# Patient Record
Sex: Female | Born: 1975 | Hispanic: No | Marital: Married | Smoking: Never smoker
Health system: Western US, Community
[De-identification: ages and names within clinical notes are randomized; demographics above are authoritative.]

## PROBLEM LIST (undated history)

## (undated) ENCOUNTER — Inpatient Hospital Stay (HOSPITAL_COMMUNITY): Payer: Self-pay

## (undated) DIAGNOSIS — E559 Vitamin D deficiency, unspecified: Secondary | ICD-10-CM

## (undated) DIAGNOSIS — U071 COVID-19: Secondary | ICD-10-CM

## (undated) DIAGNOSIS — IMO0001 Reserved for inherently not codable concepts without codable children: Secondary | ICD-10-CM

## (undated) DIAGNOSIS — E538 Deficiency of other specified B group vitamins: Secondary | ICD-10-CM

## (undated) DIAGNOSIS — G43909 Migraine, unspecified, not intractable, without status migrainosus: Secondary | ICD-10-CM

## (undated) DIAGNOSIS — K56609 Unspecified intestinal obstruction, unspecified as to partial versus complete obstruction: Secondary | ICD-10-CM

## (undated) DIAGNOSIS — K219 Gastro-esophageal reflux disease without esophagitis: Secondary | ICD-10-CM

## (undated) DIAGNOSIS — Z9049 Acquired absence of other specified parts of digestive tract: Secondary | ICD-10-CM

## (undated) DIAGNOSIS — D649 Anemia, unspecified: Secondary | ICD-10-CM

## (undated) DIAGNOSIS — E041 Nontoxic single thyroid nodule: Secondary | ICD-10-CM

## (undated) DIAGNOSIS — E282 Polycystic ovarian syndrome: Secondary | ICD-10-CM

## (undated) DIAGNOSIS — J302 Other seasonal allergic rhinitis: Secondary | ICD-10-CM

## (undated) DIAGNOSIS — C4490 Unspecified malignant neoplasm of skin, unspecified: Secondary | ICD-10-CM

## (undated) DIAGNOSIS — R0600 Dyspnea, unspecified: Secondary | ICD-10-CM

## (undated) DIAGNOSIS — E612 Magnesium deficiency: Secondary | ICD-10-CM

## (undated) DIAGNOSIS — Z9884 Bariatric surgery status: Secondary | ICD-10-CM

## (undated) DIAGNOSIS — E039 Hypothyroidism, unspecified: Secondary | ICD-10-CM

## (undated) HISTORY — PX: FRACTURE SURGERY: SHX138

## (undated) HISTORY — PX: TONSILLECTOMY: SUR1361

## (undated) HISTORY — PX: OTHER SURGICAL HISTORY: SHX169

## (undated) HISTORY — PX: SKIN CANCER EXCISION: SHX779

## (undated) HISTORY — DX: Migraine, unspecified, not intractable, without status migrainosus: G43.909

## (undated) HISTORY — PX: GASTRIC BYPASS: SHX52

## (undated) HISTORY — PX: COLON SURGERY: SHX602

## (undated) HISTORY — DX: Dyspnea, unspecified: R06.00

## (undated) HISTORY — PX: CHOLECYSTECTOMY: SHX55

## (undated) LAB — HM PAP SMEAR

## (undated) MED FILL — DEXTROAMPHETAMINE-AMPHETAMINE ER 15 MG 24HR CAPSULE,EXTEND RELEASE: 15 mg | ORAL | Qty: 28 | Fill #1

## (undated) MED FILL — ATENOLOL 25 MG TABLET: 25 mg | ORAL | 60 days supply | Qty: 30 | Fill #3

## (undated) MED FILL — LIOTHYRONINE 5 MCG TABLET: 5 ug | ORAL | 30 days supply | Qty: 30 | Fill #3

## (undated) MED FILL — ACETYLCYSTEINE 600 MG CAPSULE: 600 600 mg | ORAL | 90 days supply | Qty: 180 | Fill #2 | Status: CN

## (undated) MED FILL — LEVOTHYROXINE 150 MCG TABLET: 150 ug | ORAL | 90 days supply | Qty: 90 | Fill #2

---

## 2007-03-03 LAB — CBC WITH AUTO DIFFERENTIAL
Basophils %: 1 % (ref 0–2)
Basophils, Absolute: 0.1 10*3/uL (ref 0–0.2)
Eosinophils %: 4 % (ref 0–7)
Eosinophils, Absolute: 0.3 10*3/uL (ref 0–0.7)
HCT: 36.9 % — ABNORMAL LOW (ref 37.0–48.0)
Hgb: 12.7 gm/dL (ref 12.0–16.0)
Lymphocytes %: 20 % — ABNORMAL LOW (ref 25–45)
Lymphocytes, Absolute: 1.6 10*3/uL (ref 1.1–4.3)
MCH: 29.8 pg (ref 27–34)
MCHC: 34.4 gm/dL (ref 32–36)
MCV: 86.5 fL (ref 81–99)
MPV: 8.9 fL (ref 7.4–10.4)
Monocytes %: 7 % (ref 0–12)
Monocytes, Absolute: 0.6 10*3/uL (ref 0–1.2)
Neutrophils, Absolute: 5.2 10*3/uL (ref 1.6–7.3)
Platelet Count: 301 10*3/uL (ref 150–400)
RBC: 4.27 10*6/uL (ref 4.20–5.40)
RDW: 11.9 % (ref 11.5–14.5)
Segs (Neutrophils)%: 68 % (ref 35–70)
WBC: 7.8 10*3/uL (ref 4.8–10.8)

## 2007-03-03 LAB — URINALYSIS, ROUTINE
Bilirubin, urine: NEGATIVE
Blood, urine: NEGATIVE
Glucose, UA: NEGATIVE mg/dl
Ketones, urine: NEGATIVE
Leukocyte Esterase, urine: NEGATIVE
Nitrite, urine: POSITIVE — AB
Protein, urine: NEGATIVE
Specific Gravity, urine: 1.025 (ref 1.002–1.035)
Urobilinogen, urine: NEGATIVE EU/dL
pH, UA: 6 (ref 5.0–9.0)

## 2007-03-03 LAB — UA, MICROSCOPIC ONLY
RBC, UA: NONE SEEN /HPF
Squamous Epithelial, UA: 2 /HPF (ref 0–5)
WBC, UA: 0 /HPF

## 2007-03-03 LAB — BASIC METABOLIC PANEL
Anion Gap: 5 mmol/L (ref 3–11)
BUN: 16 mg/dL (ref 8–21)
CO2 - Carbon Dioxide: 26 mmol/L (ref 22.0–31.0)
Calcium: 8.6 mg/dL (ref 8.5–10.5)
Chloride: 107 mmol/L (ref 98–111)
Creatinine, Serum: 0.8 mg/dL (ref 0.6–1.3)
GFR Estimate: >60 mL/min/{1.73_m2} (ref >60)
Glucose: 98 mg/dL (ref 80–99)
Potassium: 3.5 mmol/L (ref 3.5–5.1)
Sodium: 138 mmol/L (ref 133–147)

## 2007-03-03 LAB — URINE CULTURE IF INDICATED ORD W/URINALYSIS

## 2007-03-11 LAB — URINALYSIS, ROUTINE
Bilirubin, urine: NEGATIVE
Blood, urine: NEGATIVE
Glucose, UA: NEGATIVE mg/dl
Ketones, urine: NEGATIVE
Leukocyte Esterase, urine: NEGATIVE
Nitrite, urine: NEGATIVE
Protein, urine: NEGATIVE
Specific Gravity, urine: 1.01 (ref 1.002–1.035)
Urobilinogen, urine: NEGATIVE EU/dL
pH, UA: 6 (ref 5.0–9.0)

## 2007-03-11 LAB — URINE CULTURE: Culture Result: >100000

## 2007-07-11 LAB — WOUND CULTURE, SUPERFICIAL

## 2007-08-29 LAB — COMPREHENSIVE METABOLIC PANEL
ALT - Alanine Amino transferase: 12 IU/L (ref 5–40)
AST - Aspartate Aminotransferase: 15 IU/L (ref 10–42)
Albumin/Globulin Ratio: 1.1 (ref >0.9)
Albumin: 3.7 gm/dL (ref 3.5–5.0)
Alkaline Phosphatase: 57 IU/L (ref 37–107)
Anion Gap: 5 mmol/L (ref 3–11)
BUN: 12 mg/dL (ref 8–21)
Bilirubin, Total: 0.6 mg/dL (ref 0.2–1.2)
CO2 - Carbon Dioxide: 26 mmol/L (ref 22.0–31.0)
Calcium: 9 mg/dL (ref 8.5–10.5)
Chloride: 109 mmol/L (ref 98–111)
Creatinine, Serum: 0.8 mg/dL (ref 0.6–1.3)
GFR Estimate: >60 mL/min/{1.73_m2} (ref >60)
Globulin: 3.3 gm/dL (ref 2.2–3.7)
Glucose: 107 mg/dL — ABNORMAL HIGH (ref 80–99)
Potassium: 3.9 mmol/L (ref 3.5–5.1)
Protein, Total: 7 gm/dL (ref 6.1–7.9)
Sodium: 140 mmol/L (ref 133–147)

## 2007-08-29 LAB — CBC WITH AUTO DIFFERENTIAL
Basophils %: 1 % (ref 0–2)
Basophils, Absolute: 0 10*3/uL (ref 0–0.2)
Eosinophils %: 2 % (ref 0–7)
Eosinophils, Absolute: 0.2 10*3/uL (ref 0–0.7)
HCT: 38.2 % (ref 37.0–48.0)
Hgb: 13 gm/dL (ref 12.0–16.0)
Lymphocytes %: 16 % — ABNORMAL LOW (ref 25–45)
Lymphocytes, Absolute: 1.3 10*3/uL (ref 1.1–4.3)
MCH: 29.5 pg (ref 27–34)
MCHC: 34 gm/dL (ref 32–36)
MCV: 86.6 fL (ref 81–99)
MPV: 9.3 fL (ref 7.4–10.4)
Monocytes %: 5 % (ref 0–12)
Monocytes, Absolute: 0.4 10*3/uL (ref 0–1.2)
Neutrophils, Absolute: 6.3 10*3/uL (ref 1.6–7.3)
Platelet Count: 292 10*3/uL (ref 150–400)
RBC: 4.41 10*6/uL (ref 4.20–5.40)
RDW: 14 % (ref 11.5–14.5)
Segs (Neutrophils)%: 76 % — ABNORMAL HIGH (ref 35–70)
WBC: 8.2 10*3/uL (ref 4.8–10.8)

## 2007-08-29 LAB — GLYCO-HEMOGLOBIN A1C: Hemoglobin A1C: 5.2 % (ref 4.3–6.1)

## 2007-08-29 LAB — T3, FREE: T3, Free: 2.5 pg/mL (ref 2.4–3.9)

## 2007-08-29 LAB — THYROGLOBULIN AND PEROXIDASE ABS
Thyroglobulin Antibody: <1.8 IU/mL (ref <4.0)
Thyroid Peroxidase Antibody: 13.8 IU/mL — ABNORMAL HIGH (ref <9.0)

## 2007-08-29 LAB — T4, FREE: T4, Free: 1.2 ng/dL (ref 0.6–1.2)

## 2007-08-29 LAB — TSH: TSH - Thyroid Stimulating Hormone: 1.83 microIU/mL (ref 0.40–5.80)

## 2008-01-28 LAB — CBC WITH AUTO DIFFERENTIAL
Basophils %: 1 % (ref 0–2)
Basophils, Absolute: 0.1 10*3/uL (ref 0–0.2)
Eosinophils %: 3 % (ref 0–7)
Eosinophils, Absolute: 0.3 10*3/uL (ref 0–0.7)
HCT: 37.3 % (ref 37.0–48.0)
Hgb: 12.9 gm/dL (ref 12.0–16.0)
Lymphocytes %: 25 % (ref 25–45)
Lymphocytes, Absolute: 2.1 10*3/uL (ref 1.1–4.3)
MCH: 28.8 pg (ref 27–34)
MCHC: 34.5 gm/dL (ref 32–36)
MCV: 83.5 fL (ref 81–99)
MPV: 9.4 fL (ref 7.4–10.4)
Monocytes %: 5 % (ref 0–12)
Monocytes, Absolute: 0.4 10*3/uL (ref 0–1.2)
Neutrophils, Absolute: 5.5 10*3/uL (ref 1.6–7.3)
Platelet Count: 314 10*3/uL (ref 150–400)
RBC: 4.46 10*6/uL (ref 4.20–5.40)
RDW: 12.4 % (ref 11.5–14.5)
Segs (Neutrophils)%: 66 % (ref 35–70)
WBC: 8.3 10*3/uL (ref 4.8–10.8)

## 2008-01-28 LAB — TSH: TSH - Thyroid Stimulating Hormone: 2.03 microIU/mL (ref 0.40–5.80)

## 2008-01-28 LAB — THYROGLOBULIN AND PEROXIDASE ABS
Thyroglobulin Antibody: <1.8 IU/mL (ref <4.0)
Thyroid Peroxidase Antibody: 12.2 IU/mL — ABNORMAL HIGH (ref <9.0)

## 2008-01-28 LAB — T3, FREE: T3, Free: 2.9 pg/mL (ref 2.4–3.9)

## 2008-01-28 LAB — ESTRADIOL: Estradiol: 54 pg/mL

## 2008-01-28 LAB — FOLLICLE STIMULATING HORMONE: FSH: 8.9 m[IU]/mL

## 2008-01-28 LAB — LUTEINIZING HORMONE: LH: 8.9 m[IU]/mL

## 2008-01-28 LAB — PROGESTERONE: Progesterone: 0.5 ng/mL (ref 0.1–20.0)

## 2008-01-28 LAB — T4, FREE: T4, Free: 1.3 ng/dL — ABNORMAL HIGH (ref 0.6–1.2)

## 2008-02-12 IMAGING — CR DG ABDOMEN 1V
1 series · 3 of 3 positions shown · non-contrast
Comparison: NONE

CLINICAL DATA: Abdominal pain for 3 days. 

ABDOMINAL KUB UPRIGHT IMAGE

[Series 1: view not recorded · 0.17mm/px · 3 of 3 slices shown]
[im 1/3]
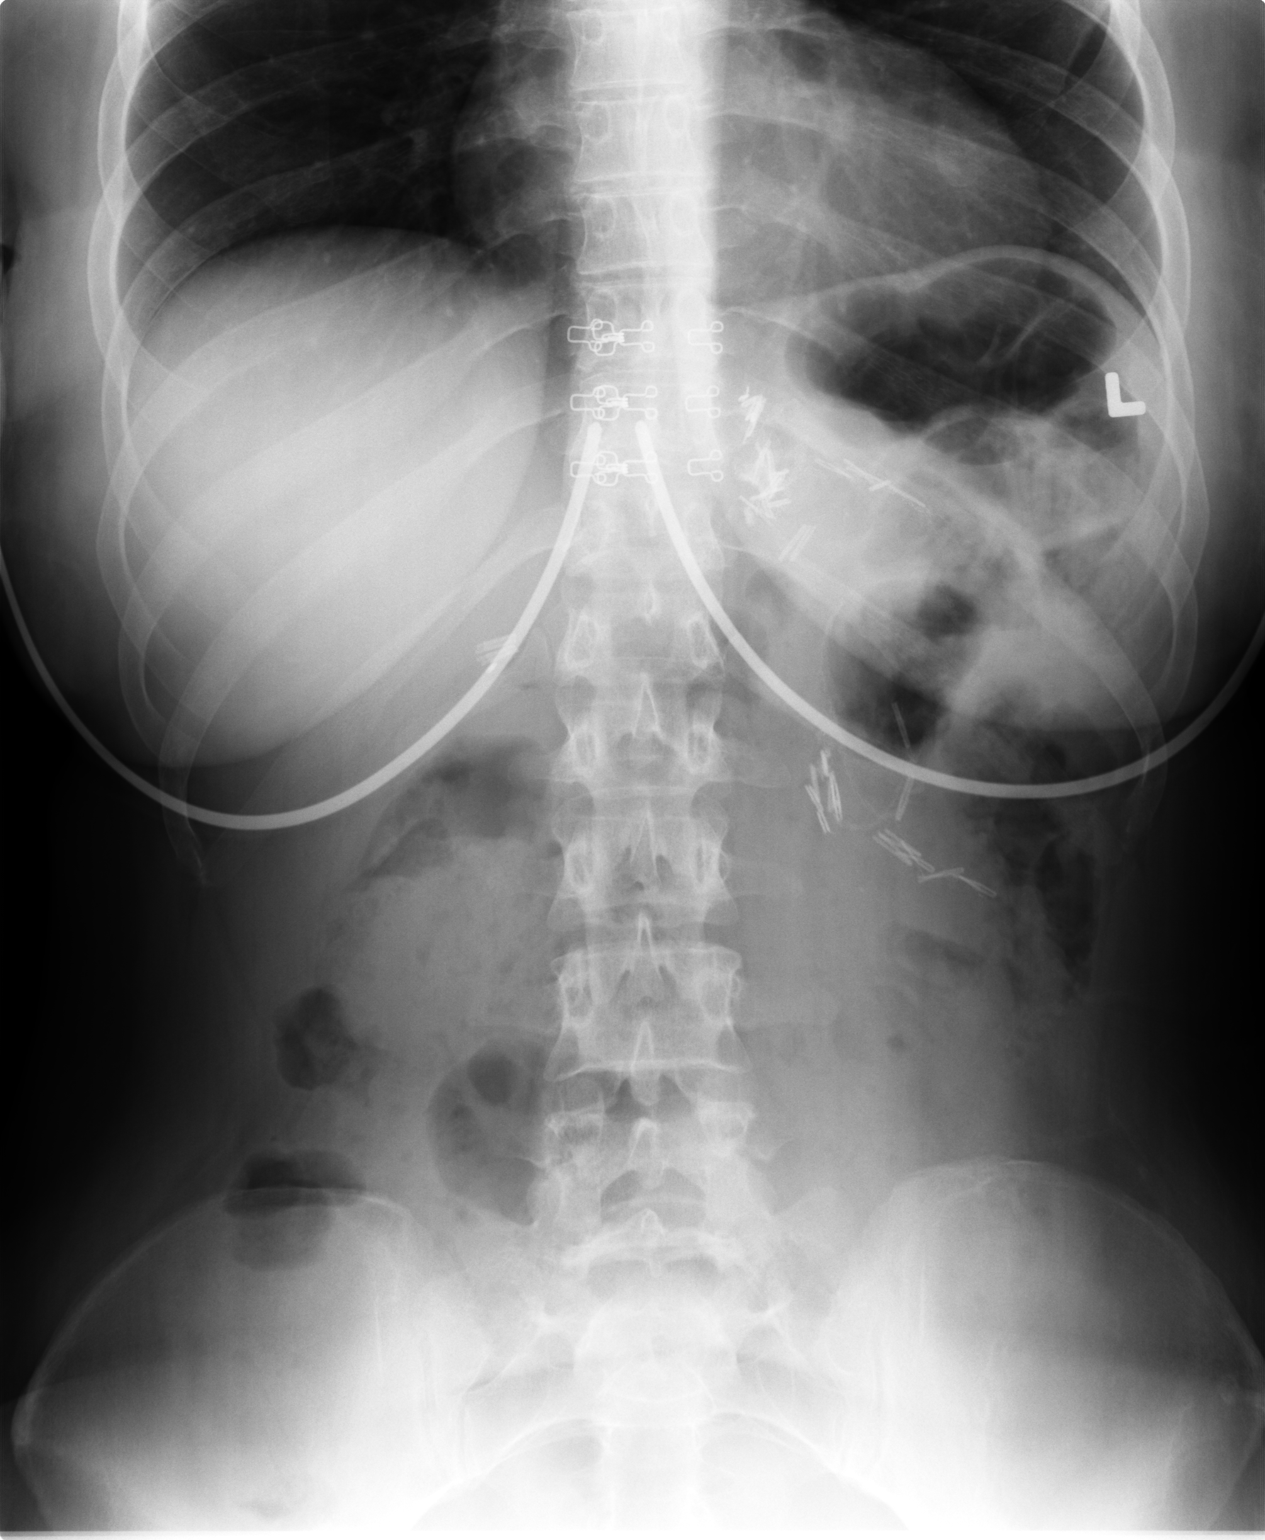
[im 2/3]
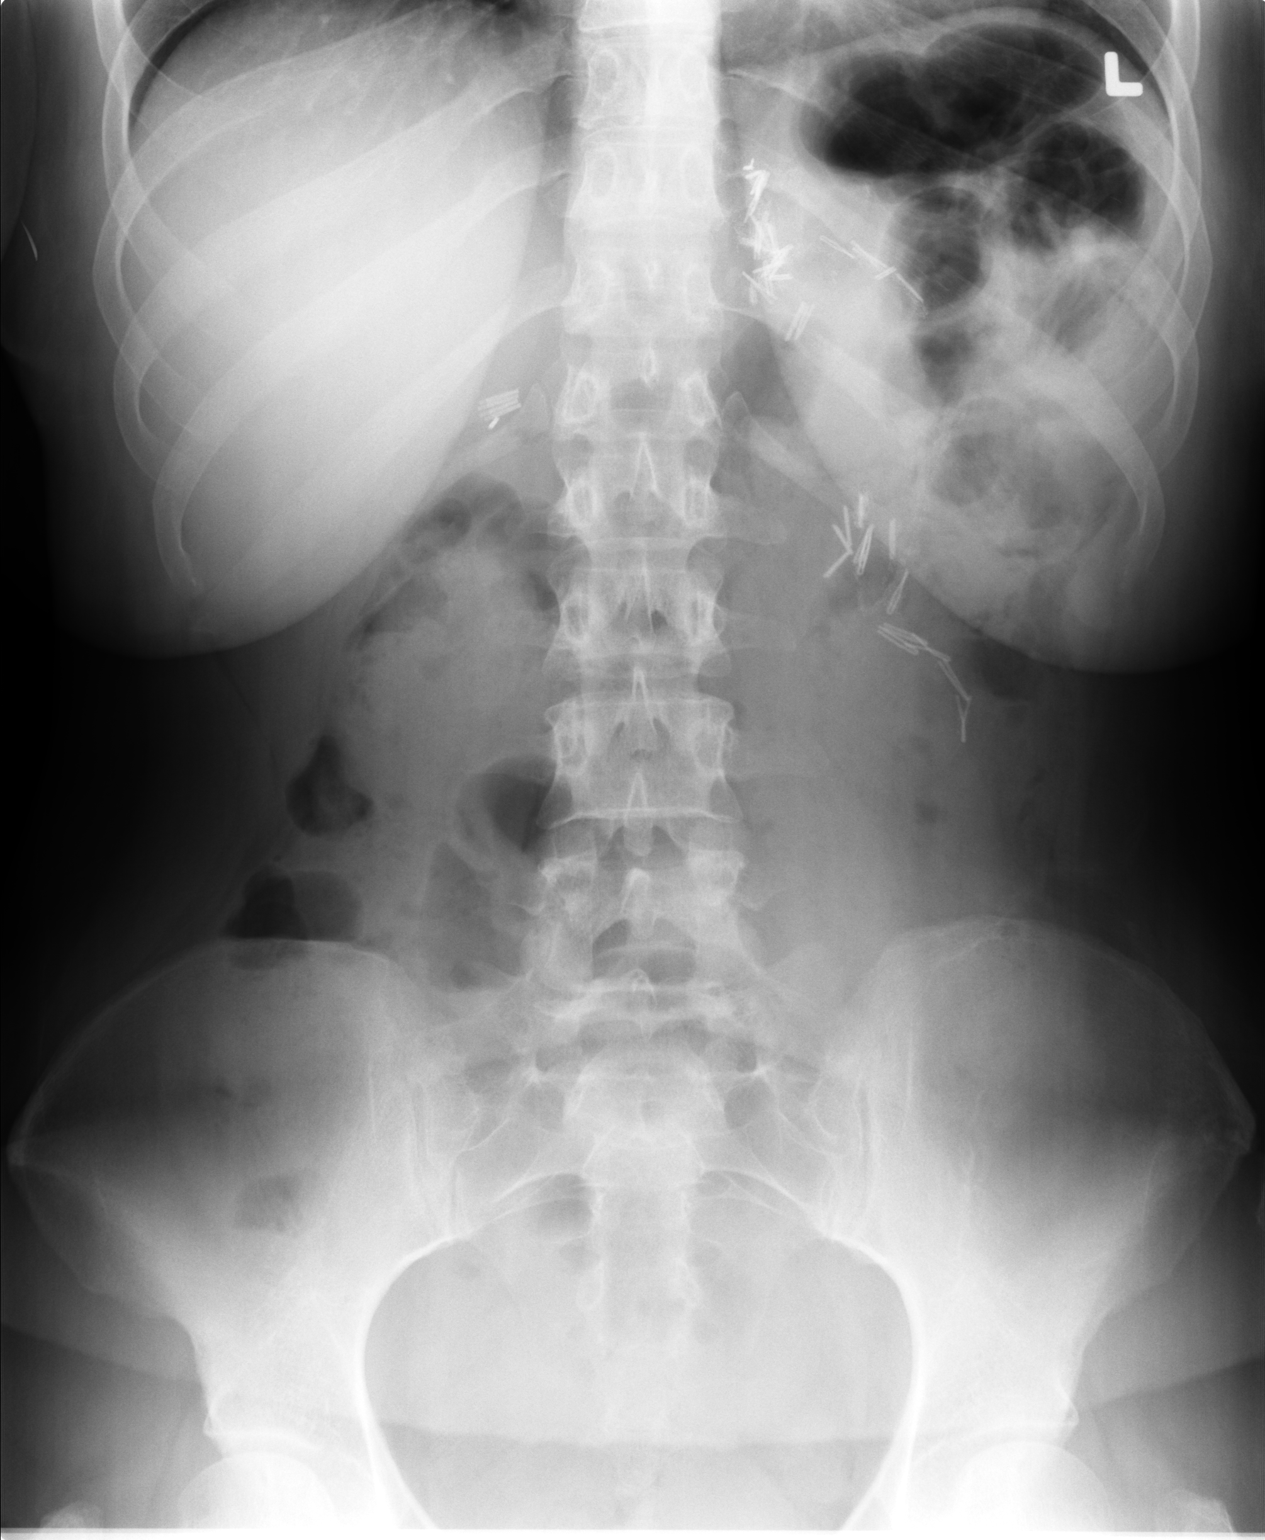
[im 3/3]
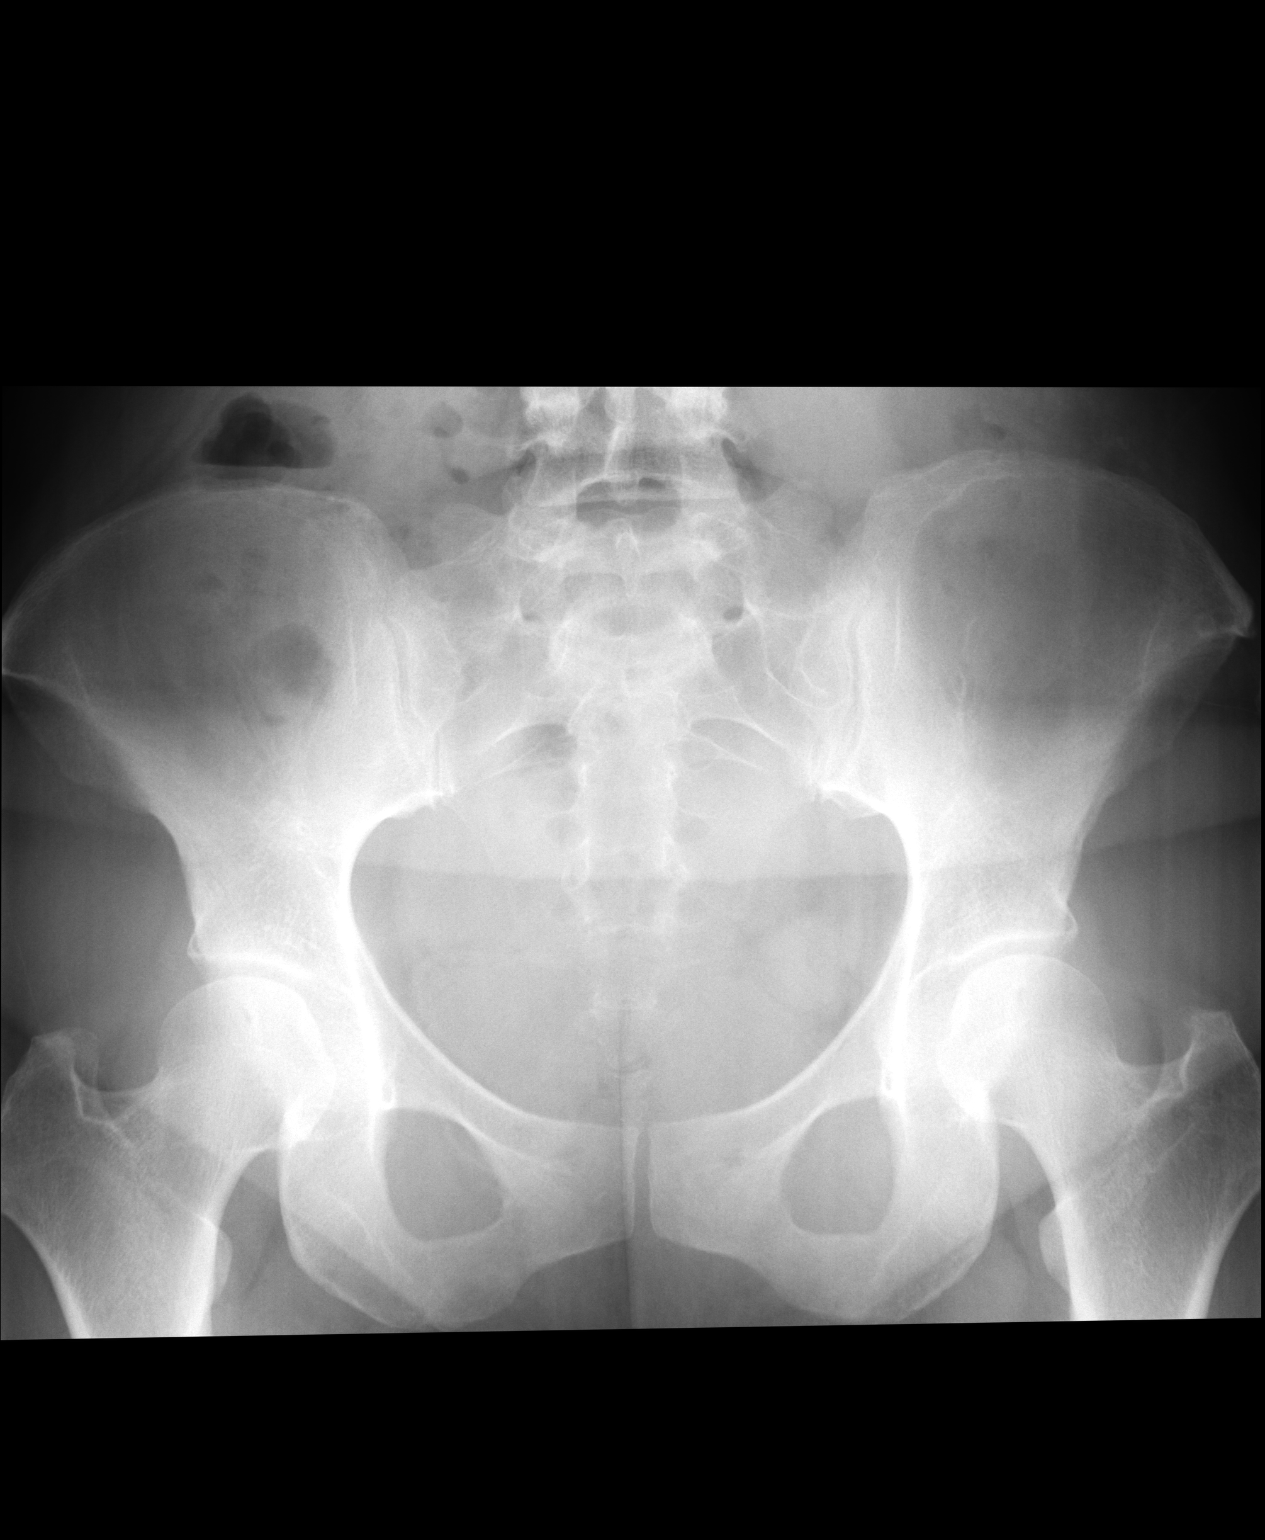

[3 of 3 positions shown; findings below may reference images not displayed]

FINDINGS: There are surgical clips seen in the left side of the 
abdomen. No evidence of free air or bowel dilatation. No abnormal 
air-fluid levels are seen. The visualized skeleton appears to be 
within normal limits. There is stool scattered throughout the 
colon.
IMPRESSION: Question constipation. No evidence of bowel 
obstruction, ileus, or perforation. Jaymi Su, M.D. 
Date: 06/23/2007 J[facility]  [REDACTED]

## 2008-07-25 LAB — ESTRADIOL: Estradiol: 40 pg/mL

## 2008-07-25 LAB — LUTEINIZING HORMONE: LH: 14.1 m[IU]/mL

## 2008-08-18 ENCOUNTER — Other Ambulatory Visit: Admission: RE | Admit: 2008-08-18 | Discharge: 2008-08-18 | Payer: Self-pay | Admitting: Family Medicine

## 2009-03-23 LAB — ORGANISM ID CULTURE

## 2009-03-23 LAB — TOTAL T3: T3, Total: 92 ng/dL (ref 80–190)

## 2009-10-03 LAB — HIV-1 AND HIV-2 ANTIBODIES: HIV-1/HIV-2 Ab: NEGATIVE

## 2009-10-03 LAB — TOTAL T3: T3, Total: 78 ng/dL — ABNORMAL LOW (ref 80–190)

## 2010-05-26 ENCOUNTER — Other Ambulatory Visit: Admission: RE | Admit: 2010-05-26 | Discharge: 2010-05-26 | Payer: Self-pay | Admitting: Family Medicine

## 2012-09-27 ENCOUNTER — Encounter: Payer: Self-pay | Admitting: Emergency Medicine

## 2012-09-27 ENCOUNTER — Emergency Department
Admission: EM | Admit: 2012-09-27 | Discharge: 2012-09-27 | Payer: No Typology Code available for payment source | Source: Home / Self Care

## 2012-09-27 NOTE — ED Notes (Signed)
Dr. Alvester Morin has seen patient and discussed need for evaluation in ER; patient and support person understand and will go there now.

## 2012-09-27 NOTE — ED Notes (Signed)
Patient reports conjunctivitis and periorbital edema x 24 hours; no known injury; has had "pink eye" in past, but nothing this severe.

## 2013-11-05 NOTE — L&D Delivery Note (Signed)
Delivery Note At 10:33 AM, on Dec. 29, 2015, a viable female, "Margaret Miller" was delivered via Vaginal, Spontaneous Delivery in the waterbirth tub (Presentation Occiput Anterior). V.Smith, CNM present for delivery as proctor.  Infant embraced by mother and brought to chest were provider gave tactile stimulation with dry towel.  Infant APGARs: 9, 9. Mother up and out of tub then cord clamped, cut, and blood collected. Placenta delivered spontaneously and noted to be intact with 3VC upon inspection. Vaginal inspection revealed partial 3rd degree laceration and a periclitoral laceration that extended into the labia. Dr. Octavio Manns called and performed repair.  Fundus firm, at the umbilicus, and bleeding small.  Mother hemodynamically stable and infant skin to skin prior to provider and MD exit.  Mother unsure of birth control method and opts to breastfeed. Family desires outpatient circumcision and infant weight at one hour of life: 7lbs 7oz  Anesthesia: Local  Episiotomy: None Lacerations: 3rd degree, Periclitoral with extension into the labia Suture Repair: 3.0 vicryl Est. Blood Loss (mL):  500  Mom to postpartum.  Baby to Couplet care / Skin to Skin.  Margaret Miller, [name] MSN, CNM 11/02/2014, 11:22 AM

## 2014-01-26 DIAGNOSIS — E039 Hypothyroidism, unspecified: Secondary | ICD-10-CM | POA: Diagnosis present

## 2014-02-09 DIAGNOSIS — E7211 Homocystinuria: Secondary | ICD-10-CM | POA: Insufficient documentation

## 2014-02-09 DIAGNOSIS — E7212 Methylenetetrahydrofolate reductase deficiency: Secondary | ICD-10-CM

## 2014-03-23 LAB — OB RESULTS CONSOLE RPR: RPR: NONREACTIVE

## 2014-03-23 LAB — OB RESULTS CONSOLE GC/CHLAMYDIA
Chlamydia: NEGATIVE
GC PROBE AMP, GENITAL: NEGATIVE

## 2014-03-23 LAB — OB RESULTS CONSOLE HEPATITIS B SURFACE ANTIGEN: HEP B S AG: NEGATIVE

## 2014-03-23 LAB — OB RESULTS CONSOLE RUBELLA ANTIBODY, IGM: RUBELLA: IMMUNE

## 2014-03-23 LAB — OB RESULTS CONSOLE ABO/RH: RH TYPE: POSITIVE

## 2014-03-23 LAB — OB RESULTS CONSOLE HIV ANTIBODY (ROUTINE TESTING): HIV: NONREACTIVE

## 2014-03-23 LAB — OB RESULTS CONSOLE ANTIBODY SCREEN: Antibody Screen: NEGATIVE

## 2014-09-07 ENCOUNTER — Ambulatory Visit: Admit: 2014-09-07 | Discharge: 2014-09-07 | Payer: PRIVATE HEALTH INSURANCE | Attending: MD | Primary: Family

## 2014-09-07 ENCOUNTER — Ambulatory Visit: Admit: 2014-09-07 | Discharge: 2014-09-07 | Payer: PRIVATE HEALTH INSURANCE | Primary: Family

## 2014-09-07 DIAGNOSIS — E063 Autoimmune thyroiditis: Secondary | ICD-10-CM

## 2014-09-07 LAB — COMPREHENSIVE METABOLIC PANEL
ALT - Alanine Amino transferase: 19 IU/L (ref 7–52)
AST - Aspartate Aminotransferase: 14 IU/L (ref 8–39)
Albumin/Globulin Ratio: 1.2 (ref >0.9)
Albumin: 4.1 gm/dL (ref 3.5–5.0)
Alkaline Phosphatase: 71 IU/L (ref 34–104)
Anion Gap: 7.1 mmol/L (ref 3–11)
BUN: 11 mg/dL (ref 6.0–23.0)
Bilirubin, Total: 0.5 mg/dL (ref 0.3–1.2)
CO2 - Carbon Dioxide: 23.9 mmol/L (ref 21.0–31.0)
Calcium: 9.1 mg/dL (ref 8.6–10.0)
Chloride: 105 mmol/L (ref 98–111)
Creatinine, Serum: 0.74 mg/dL (ref 0.55–1.10)
GFR Estimate: >60 mL/min/{1.73_m2} (ref >60)
Globulin: 3.5 gm/dL (ref 2.2–3.7)
Glucose: 86 mg/dL (ref 80–99)
Potassium: 4 mmol/L (ref 3.5–5.1)
Protein, Total: 7.6 gm/dL (ref 6.0–8.0)
Sodium: 136 mmol/L (ref 135–143)

## 2014-09-07 LAB — CBC WITH AUTO DIFFERENTIAL
Basophils %: 1 % (ref 0–2)
Basophils, Absolute: 0.1 10*3/uL (ref 0–0.2)
Eosinophils %: 3 % (ref 0–7)
Eosinophils, Absolute: 0.3 10*3/uL (ref 0–0.7)
HCT: 42.6 % (ref 37.0–48.0)
Hgb: 13.6 gm/dL (ref 12.0–16.0)
Lymphocytes %: 20 % — ABNORMAL LOW (ref 25–45)
Lymphocytes, Absolute: 2.2 10*3/uL (ref 1.1–4.3)
MCH: 28.2 pg (ref 27–34)
MCHC: 31.9 gm/dL — ABNORMAL LOW (ref 32–36)
MCV: 88.3 fL (ref 81–99)
MPV: 9.9 fL (ref 7.4–10.4)
Monocytes %: 4 % (ref 0–12)
Monocytes, Absolute: 0.5 10*3/uL (ref 0–1.2)
Neutrophils, Absolute: 8 10*3/uL — ABNORMAL HIGH (ref 1.6–7.3)
Platelet Count: 329 10*3/uL (ref 150–400)
RBC: 4.82 10*6/uL (ref 4.20–5.40)
RDW: 13.4 % (ref 11.5–14.5)
Segs (Neutrophils)%: 72 % — ABNORMAL HIGH (ref 35–70)
WBC: 11 10*3/uL — ABNORMAL HIGH (ref 4.8–10.8)

## 2014-09-07 LAB — T3, FREE: T3, Free: 3.5 pg/mL (ref 2.4–3.9)

## 2014-09-07 LAB — TSH: TSH - Thyroid Stimulating Hormone: 0.39 microIU/mL — ABNORMAL LOW (ref 0.40–5.80)

## 2014-09-07 LAB — T4, FREE: T4, Free: 1.4 ng/dL — ABNORMAL HIGH (ref 0.6–1.2)

## 2014-09-07 NOTE — Progress Notes (Deleted)
Kerry Allen is a 38 y.o. female, DOB 1976-01-05, who presents today for Rash          Rash  This is a recurrent problem. The current episode started more than 1 month ago. The problem is unchanged. The affected locations include the abdomen. She was exposed to nothing. Pertinent negatives include no fever. Past treatments include anti-itch cream, antibiotic cream and moisturizer. The treatment provided no relief.       {Common ambulatory SmartLinks:[id number]:: }      Review of Systems   Constitutional: Negative for fever and activity change.   Skin: Positive for rash.            Filed Vitals:    09/07/14 1033   BP: 118/64   Pulse: 86   SpO2: 99%    There is no height or weight on file to calculate BMI.            Physical Exam      ASSESSMENT & PLAN    No diagnosis found.

## 2014-09-07 NOTE — Telephone Encounter (Signed)
Thanks so much.  Please also FAX the thyroid labs when available.  Thanks so much

## 2014-09-07 NOTE — Telephone Encounter (Signed)
Advised patient of results she states she is not having any symptoms and no pain when she urinates. I will fax all labs to Dr. Leonia Reader.

## 2014-09-07 NOTE — Telephone Encounter (Signed)
-----   Message from [name], MD sent at 09/07/2014  1:51 PM PST -----  Please call patient and let her know the results of her labs today:  CMP is WNL.  Her CBC shows a slightly elevated WBC.  No report of fever, chills or infection today at her OV.  Any symptoms at all? Pain when she urinates?    The thyroid studies are pending.  Please call patient.  Please also when thyroid labs are available, please FAX ALL labs to her endocrinologist Dr Leonia Reader.  Thanks so much

## 2014-09-07 NOTE — Progress Notes (Signed)
Kerry Allen is a 38 y.o. female who presents with Rash  .       HISTORY OF PRESENT ILLNESS  Doing well at this time; rash at waistline; sees Dr Leonia Reader for thyroid    Past Medical History   Diagnosis Date   . Hashimoto's thyroiditis      Hashimoto's thyroiditis with hyperthyroidism   . Hypothyroidism      Hypothyroidism-Dr. Leonia Reader   . Vitamin D deficiency      Past Surgical History   Procedure Laterality Date   . Tubal ligation       Tubal ligation 2008     Current Outpatient Prescriptions on File Prior to Visit   Medication Sig Dispense Refill   . cholecalciferol, vitamin D3, (VITAMIN D3) 1,000 unit tablet Take 1,000 Units by mouth daily.     Marland Kitchen liothyronine (CYTOMEL) 5 MCG tablet Take 5 mcg by mouth.     Marland Kitchen LORazepam (ATIVAN) 0.5 MG tablet Take 0.5 mg by mouth.     . [DISCONTINUED] levothyroxine (SYNTHROID, LEVOTHROID) 150 MCG tablet Take 150 mcg by mouth daily.     . [DISCONTINUED] levothyroxine (SYNTHROID, LEVOTHROID) 175 MCG tablet Take 175 mcg by mouth daily.       No current facility-administered medications on file prior to visit.     Allergies   Allergen Reactions   . Penicillins Other (See Comments)     History     Social History   . Marital Status: Married     Spouse Name: N/A     Number of Children: N/A   . Years of Education: N/A     Social History Main Topics   . Smoking status: Never Smoker    . Smokeless tobacco: None   . Alcohol Use: Yes      Comment: occasional    . Drug Use: No   . Sexual Activity: None     Other Topics Concern   . Caffeine Concern Yes     moderate   . Exercise Yes     occasional      Social History Narrative       Abstract on 09/04/2014   Component Date Value Ref Range Status   . HM Pap smear 09/27/2009 Abstract   Final       REVIEW OF SYSTEMS  Review of Systems   Constitutional: Positive for fatigue.   HENT: Negative.    Eyes: Negative.    Respiratory: Negative.    Cardiovascular: Negative.    Gastrointestinal: Negative.    Endocrine: Positive for heat intolerance.      Genitourinary: Negative.         Dysfunctional uterine bleeding noted   Musculoskeletal: Positive for arthralgias.   Skin: Positive for rash.   Allergic/Immunologic: Positive for environmental allergies. Negative for food allergies and immunocompromised state.   Neurological: Negative.    Hematological: Negative.    Psychiatric/Behavioral: Positive for dysphoric mood.       PHYSICAL EXAM  BP 118/64 mmHg  Pulse 86  SpO2 99%  Breastfeeding? No  There is no height or weight on file to calculate BMI.    Physical Exam   Constitutional: She is oriented to person, place, and time. She appears well-developed and well-nourished. She appears distressed.   Overweight female in mild to moderate distress today   HENT:   Head: Normocephalic and atraumatic.   Right Ear: External ear normal.   Left Ear: External ear normal.   Nose: Nose normal.   Mouth/Throat:  Oropharynx is clear and moist.   Eyes: Conjunctivae and EOM are normal. Pupils are equal, round, and reactive to light.   Neck: Normal range of motion. Neck supple.   Cardiovascular: Normal rate, regular rhythm, normal heart sounds and intact distal pulses.    Pulmonary/Chest: Effort normal and breath sounds normal.   Abdominal: Soft. Bowel sounds are normal.   Musculoskeletal: Normal range of motion. She exhibits tenderness.   Neurological: She is alert and oriented to person, place, and time. She has normal reflexes.   Skin: Skin is warm and dry. Rash noted.   Patient with allergic contact dermatitis at the mid-waistline today.  Due to metal buckle on her pants.     Psychiatric: She exhibits a depressed mood.   Nursing note and vitals reviewed.        ASSESSMENT & PLAN    ICD-9-CM ICD-10-CM    1. Hashimoto's thyroiditis 245.2 E06.3 T3, Free -Routine      T4, Free -Routine      Thyroid Stimulating Hormone -Routine   2. Hyperlipidemia 272.4 E78.5    3. Anxiety state 300.00 F41.1    4. 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 Vitamin D deficiency 268.9 E55.9     6. Hypothyroidism, unspecified 244.9 E03.9    7. Dysfunctional uterine bleeding 626.8 N93.8 Comprehensive Metabolic Panel -Routine      CBC with Auto Differential -Routine      Referral to Obstetrics / Gynecology [facility])   8. Malaise and fatigue 780.79 R53.81      R53.83    9. 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  Will follow       Return in about 6 months (around 03/08/2015) for allergic contact dermatitis, anxiety and depression.    This note was transcribed using speech recognition software. Please contact us for clarification if any questions arise relating to the wording of this document.

## 2014-09-27 NOTE — Telephone Encounter (Signed)
From: [name]  To: [name], MD  Sent: 09/24/2014 9:00 AM PST  Subject: Non-Urgent Medical Question    Dr Jorge Ny,    When you took my blood the nurse called and asked if it was hurting when I urinated. It isn't but I have noticed this last week that my overies have been hurting and my lower back. Also my urin is very strong smelling. Could I be getting a kidney or UTI infection? I don't get them so don't know the symptoms.     Thank you,    Mardene Celeste

## 2014-09-27 NOTE — Telephone Encounter (Signed)
Please see message below

## 2014-10-07 LAB — OB RESULTS CONSOLE GBS: GBS: POSITIVE

## 2014-10-26 NOTE — Telephone Encounter (Signed)
Thanks so much for this note.  I am at [facility], and she is welcome to see any of the endocrine doctors in the [facility].  It would be her preference.  We have Dr Cipriano Bunker here at [facility], and he is wonderful.  I am happy to refer her to whomever she'd like to see.  Please let me know what the patient would like to do and thanks so much.  Again so happy to refer her to any of the endocrine doctors; it is her choice.  Thanks so much

## 2014-10-26 NOTE — Telephone Encounter (Signed)
Please advise note below not sure what you would like her to do? Thank you

## 2014-10-26 NOTE — Telephone Encounter (Signed)
From: [name]  To: [name], MD  Sent: 10/25/2014 4:26 PM PST  Subject: Non-Urgent Medical Question    DR. HILLS,    JUST WANTED TO LET YOU KNOW THAT DR Cory Roughen IS NO LONGER WITH [facility]. SO NOT SURE IF YOU WANT ME TO SEE SOMEONE ELSE OR WHATS THE NEXT STEP??    THANK YOU,    Kerry Allen

## 2014-11-02 ENCOUNTER — Encounter (HOSPITAL_COMMUNITY): Payer: Self-pay

## 2014-11-02 ENCOUNTER — Inpatient Hospital Stay (HOSPITAL_COMMUNITY)
Admission: AD | Admit: 2014-11-02 | Discharge: 2014-11-04 | DRG: 989 | Disposition: A | Discharge status: Home / Self Care | Payer: 59 | Source: Ambulatory Visit | Attending: Obstetrics and Gynecology | Admitting: Obstetrics and Gynecology

## 2014-11-02 DIAGNOSIS — O99284 Endocrine, nutritional and metabolic diseases complicating childbirth: Secondary | ICD-10-CM | POA: Diagnosis present

## 2014-11-02 DIAGNOSIS — Z3A39 39 weeks gestation of pregnancy: Secondary | ICD-10-CM | POA: Diagnosis present

## 2014-11-02 DIAGNOSIS — Z880 Allergy status to penicillin: Secondary | ICD-10-CM

## 2014-11-02 DIAGNOSIS — E039 Hypothyroidism, unspecified: Secondary | ICD-10-CM | POA: Diagnosis present

## 2014-11-02 DIAGNOSIS — O09513 Supervision of elderly primigravida, third trimester: Secondary | ICD-10-CM

## 2014-11-02 DIAGNOSIS — O99824 Streptococcus B carrier state complicating childbirth: Secondary | ICD-10-CM | POA: Diagnosis present

## 2014-11-02 HISTORY — DX: Vitamin D deficiency, unspecified: E55.9

## 2014-11-02 HISTORY — DX: Anemia, unspecified: D64.9

## 2014-11-02 HISTORY — DX: Magnesium deficiency: E61.2

## 2014-11-02 HISTORY — DX: Reserved for inherently not codable concepts without codable children: IMO0001

## 2014-11-02 HISTORY — DX: Bariatric surgery status: Z98.84

## 2014-11-02 HISTORY — DX: Hypothyroidism, unspecified: E03.9

## 2014-11-02 HISTORY — DX: Acquired absence of other specified parts of digestive tract: Z90.49

## 2014-11-02 HISTORY — DX: Gastro-esophageal reflux disease without esophagitis: K21.9

## 2014-11-02 HISTORY — DX: Other seasonal allergic rhinitis: J30.2

## 2014-11-02 LAB — CBC
HCT: 39.2 % (ref 36.0–46.0)
Hemoglobin: 13.9 g/dL (ref 12.0–15.0)
MCH: 32.3 pg (ref 26.0–34.0)
MCHC: 35.5 g/dL (ref 30.0–36.0)
MCV: 91 fL (ref 78.0–100.0)
PLATELETS: 207 10*3/uL (ref 150–400)
RBC: 4.31 MIL/uL (ref 3.87–5.11)
RDW: 13.2 % (ref 11.5–15.5)
WBC: 13 10*3/uL — ABNORMAL HIGH (ref 4.0–10.5)

## 2014-11-02 LAB — TYPE AND SCREEN
ABO/RH(D): A POS
ANTIBODY SCREEN: NEGATIVE

## 2014-11-02 LAB — RPR

## 2014-11-02 LAB — ABO/RH: ABO/RH(D): A POS

## 2014-11-02 MED ORDER — TETANUS-DIPHTH-ACELL PERTUSSIS 5-2.5-18.5 LF-MCG/0.5 IM SUSP
0.5000 mL | Freq: Once | INTRAMUSCULAR | Status: DC
  Filled 2014-11-02: qty 0.5

## 2014-11-02 MED ORDER — WITCH HAZEL-GLYCERIN EX PADS: 1.0000 "application " | MEDICATED_PAD | CUTANEOUS | Status: DC | PRN

## 2014-11-02 MED ORDER — NALBUPHINE HCL 10 MG/ML IJ SOLN: 10.0000 mg | INTRAMUSCULAR | Status: DC | PRN

## 2014-11-02 MED ORDER — LACTATED RINGERS IV SOLN
INTRAVENOUS | Status: DC
  Administered 2014-11-02: 09:00:00 via INTRAVENOUS

## 2014-11-02 MED ORDER — LIDOCAINE HCL (PF) 1 % IJ SOLN
30.0000 mL | INTRAMUSCULAR | Status: AC | PRN
  Administered 2014-11-02 (×2): 30 mL via SUBCUTANEOUS
  Filled 2014-11-02 (×2): qty 30

## 2014-11-02 MED ORDER — IBUPROFEN 600 MG PO TABS
600.0000 mg | ORAL_TABLET | Freq: Four times a day (QID) | ORAL | Status: DC
  Administered 2014-11-02 – 2014-11-04 (×9): 600 mg via ORAL
  Filled 2014-11-02 (×9): qty 1

## 2014-11-02 MED ORDER — PRENATAL MULTIVITAMIN CH
1.0000 | ORAL_TABLET | Freq: Every day | ORAL | Status: DC
  Filled 2014-11-02 (×2): qty 1

## 2014-11-02 MED ORDER — LANOLIN HYDROUS EX OINT: TOPICAL_OINTMENT | CUTANEOUS | Status: DC | PRN

## 2014-11-02 MED ORDER — OXYCODONE-ACETAMINOPHEN 5-325 MG PO TABS: 2.0000 | ORAL_TABLET | ORAL | Status: DC | PRN

## 2014-11-02 MED ORDER — SIMETHICONE 80 MG PO CHEW: 80.0000 mg | CHEWABLE_TABLET | ORAL | Status: DC | PRN

## 2014-11-02 MED ORDER — DIBUCAINE 1 % RE OINT
1.0000 "application " | TOPICAL_OINTMENT | RECTAL | Status: DC | PRN
  Filled 2014-11-02: qty 28

## 2014-11-02 MED ORDER — ONDANSETRON HCL 4 MG PO TABS: 4.0000 mg | ORAL_TABLET | ORAL | Status: DC | PRN

## 2014-11-02 MED ORDER — CITRIC ACID-SODIUM CITRATE 334-500 MG/5ML PO SOLN: 30.0000 mL | ORAL | Status: DC | PRN

## 2014-11-02 MED ORDER — SENNOSIDES-DOCUSATE SODIUM 8.6-50 MG PO TABS
2.0000 | ORAL_TABLET | ORAL | Status: DC
  Administered 2014-11-02 – 2014-11-04 (×2): 2 via ORAL
  Filled 2014-11-02 (×2): qty 2

## 2014-11-02 MED ORDER — ONDANSETRON HCL 4 MG/2ML IJ SOLN: 4.0000 mg | INTRAMUSCULAR | Status: DC | PRN

## 2014-11-02 MED ORDER — ACETAMINOPHEN 325 MG PO TABS: 650.0000 mg | ORAL_TABLET | ORAL | Status: DC | PRN

## 2014-11-02 MED ORDER — ONDANSETRON HCL 4 MG/2ML IJ SOLN: 4.0000 mg | Freq: Four times a day (QID) | INTRAMUSCULAR | Status: DC | PRN

## 2014-11-02 MED ORDER — OXYCODONE-ACETAMINOPHEN 5-325 MG PO TABS
1.0000 | ORAL_TABLET | ORAL | Status: DC | PRN
  Administered 2014-11-02: 0.5 via ORAL
  Administered 2014-11-02 – 2014-11-03 (×3): 1 via ORAL
  Filled 2014-11-02 (×4): qty 1

## 2014-11-02 MED ORDER — DIPHENHYDRAMINE HCL 25 MG PO CAPS: 25.0000 mg | ORAL_CAPSULE | Freq: Four times a day (QID) | ORAL | Status: DC | PRN

## 2014-11-02 MED ORDER — OXYCODONE-ACETAMINOPHEN 5-325 MG PO TABS
1.0000 | ORAL_TABLET | ORAL | Status: DC | PRN
Start: 2014-11-02 — End: 2014-11-02

## 2014-11-02 MED ORDER — OXYTOCIN BOLUS FROM INFUSION
500.0000 mL | INTRAVENOUS | Status: DC
  Administered 2014-11-02: 500 mL via INTRAVENOUS

## 2014-11-02 MED ORDER — ZOLPIDEM TARTRATE 5 MG PO TABS: 5.0000 mg | ORAL_TABLET | Freq: Every evening | ORAL | Status: DC | PRN

## 2014-11-02 MED ORDER — BENZOCAINE-MENTHOL 20-0.5 % EX AERO
1.0000 "application " | INHALATION_SPRAY | CUTANEOUS | Status: DC | PRN
  Filled 2014-11-02 (×3): qty 56

## 2014-11-02 MED ORDER — CLINDAMYCIN PHOSPHATE 900 MG/50ML IV SOLN
900.0000 mg | Freq: Three times a day (TID) | INTRAVENOUS | Status: DC
  Administered 2014-11-02: 900 mg via INTRAVENOUS
  Filled 2014-11-02 (×3): qty 50

## 2014-11-02 MED ORDER — LACTATED RINGERS IV SOLN: 500.0000 mL | INTRAVENOUS | Status: DC | PRN

## 2014-11-02 MED ORDER — OXYTOCIN 40 UNITS IN LACTATED RINGERS INFUSION - SIMPLE MED
62.5000 mL/h | INTRAVENOUS | Status: DC
  Administered 2014-11-02: 62.5 mL/h via INTRAVENOUS
  Filled 2014-11-02: qty 1000

## 2014-11-02 MED ORDER — LEVOTHYROXINE SODIUM 100 MCG PO TABS
100.0000 ug | ORAL_TABLET | Freq: Every day | ORAL | Status: DC
  Administered 2014-11-03 – 2014-11-04 (×2): 100 ug via ORAL
  Filled 2014-11-02 (×4): qty 1

## 2014-11-02 NOTE — MAU Note (Signed)
Patient states she is having contractions every 2 minutes. States she started leaking clear fluid at 0400 and is now pink. Reports good fetal movement.

## 2014-11-02 NOTE — Progress Notes (Signed)
Margaret Miller MRN: [id number]  Subjective: -Patient reporting urge to push.  Coping well with contractions.  Husband and doula remains at bedside.   Objective: BP 146/81 mmHg  Pulse 63  Temp(Src) 97.8 F (36.6 C) (Oral)  Resp 20     FHT:125  bpm, Mod Var, -Decels, +Accels UC:   Q2-71min, palpates moderate SVE:   Dilation: 8 Effacement (%): 90 Station: -1 Exam by:: Gavin Pound, CNM Membranes: SROM at 0400 Pitocin: None  Assessment:  IUP at 39.2wks Cat I  FT  Active Labor-Transition Desires Waterbirth GBS Positive  Plan: -Okay to birth in water if desired, doula to prep tub -Continue other mgmt as ordered  Andrina Locken LYNN,MSN, CNM 11/02/2014, 9:14 AM

## 2014-11-02 NOTE — H&P (Signed)
Margaret Miller is a 38 y.o. female, G1P0 at 39.2 weeks, presenting for active labor at term.  Patient states she had LOF at 0400, but did not want to report to hospital until regular contractions occurred.  Patient reports active fetus and denies VB.  Patient with PCN allergy and requires clindamycin for positive GBS status.    Patient Active Problem List   Diagnosis Date Noted  . Active labor at term 11/02/2014    History of present pregnancy: Patient entered care at 8.4 weeks.   EDC of 11/07/2014 was established by Definite LMP of 02/05/2014 and confirmed by 9.3wk [place] on 04/08/2014.   Anatomy scan:  18.4 weeks, with normal,but limited, findings and an anterior placenta.   Additional [place] evaluations:   -Anatomy: EFW 9oz, Linear growth, cervix 3.50cm. Need f/u for cardiac and cord insertion. Otherwise appears normal   -21.4wks F/U:  EFW: 15 oz +/- 2 oz 43% tile cx 3.38 cm cx closed Anterior placenta NL fluid AP pocket= 5.2 cm NL cord insertion today Anatomy f/u: fetal spine, 4 ch heart, LVOT, RVOT seen today. -29.4wks: EFW 1320g 19%, AFI=14.54  -37.2: Growth U/S reviewed: EFW 2842g, AFI 19.10cm Significant prenatal events:  2nd Trimester: C/O HA, sciatica pain, and pain from old injury on arm.  3rd Trimester: BH Contractions, Sinus and Chest Congestion, pelvic pressure, and contractions Last evaluation:  10/31/2014 by Dr. Delane Ginger. Dillard--2/80/-1  FHR 138  BP 114/60 wt 198lbs c/o BH and pressure  OB History    Gravida Para Term Preterm AB TAB SAB Ectopic Multiple Living   1              Past Medical History  Diagnosis Date  . Hypothyroid    Past Surgical History  Procedure Laterality Date  . Gastric bypass    . Tonsillectomy    . Cholestectomy    . Fracture surgery     Family History: family history is not on file. Social History:  reports that she has never smoked. She does not have any smokeless tobacco history on file. She reports that she does not drink alcohol or use illicit  drugs.   Prenatal Transfer Tool  Maternal Diabetes: No Genetic Screening: Declined Maternal Ultrasounds/Referrals: Normal Fetal Ultrasounds or other Referrals:  None Maternal Substance Abuse:  No Significant Maternal Medications:  Meds include: Syntroid Other:  Calcium Fish Oil, Iron, Magnesium, PNV, Vitamin D3 Significant Maternal Lab Results: Lab values include: Group B Strep positive  ROS:  See HPI Above  Allergies not on file   Dilation: 6 Effacement (%): 100 Station: -2 Blood pressure 146/81, pulse 63, temperature 97.8 F (36.6 C), temperature source Oral, resp. rate 20.  Chest clear Heart RRR without murmur Abd gravid, NT Pelvic: Not proven, feels adequate, DC>13cm, EFW 7lbs by Le[place]olds Ext: WNL  FHR: 135 bpm, Mod Var, -Decels, +Accels UCs:  Q2-75min, palpates moderate to strong  Prenatal labs: ABO, Rh:  A Positive Antibody:  Negative Rubella:   Immune RPR:   NR HBsAg:   Negative HIV:   Negative GBS:  Positive Sickle cell/Hgb electrophoresis:  N/A Pap:  Normal  GC:  Negative Chlamydia:  Negative Genetic screenings:  Declined Glucola:  Deferred d/t h/o Gastric Bypass Surgery Other:  HbA1C-5.4, TSH-Normal    Assessment IUP at 39.2wks Cat I FT Active Labor GBS Positive-PCN Allergy Hypothyroidism AMA  Plan: Admit to [facility] per consult with Dr. Octavio Manns Routine Labor and Delivery Orders per [facility] Protocol Start GBS Prophylaxis with Clindamycin Okay to  Labor and Deliver in [place] for intermittent monitoring Will contact Marlou Porch, CNM for waterbirth proctor Will order synthroid 161mcg in PP period  [facility], Monda Chastain [facility], MSN 11/02/2014, 8:20 AM

## 2014-11-02 NOTE — Lactation Note (Signed)
This note was copied from the chart of Margaret Miller. Lactation Consultation Note Initial visit at 66 hours of age.  Mom reports baby was active after delivery and now sleepy.  Baby gaggy, but not spitting in FOB's arms.  Assisted with STS in football hold on right breast. Hand expressed several drops of colostrum to mouth.  Baby opens mouth wide for latch, but does not suck.  Baby continue to lick at breast for several minutes, but remains sleepy.  Encouraged mom to feed with early feeding cues and offer spoon feeding if baby is acting hungry, but not latching.  Mom has easily expressed colostrum. [facility] LC resources given and discussed.  Encouraged to feed with early cues on demand.  Early newborn behavior discussed.  Parents took a breast feeding class and are eager to breastfeed baby "Margaret Miller."   Mom to call for assist as needed.      Patient Name: Margaret Miller Date: 11/02/2014 Reason for consult: Initial assessment   Maternal Data Has patient been taught Hand Expression?: Yes Does the patient have breastfeeding experience prior to this delivery?: No  Feeding Feeding Type: Breast Fed Length of feed:  (several licks of drops of colostrum no latch)  LATCH Score/Interventions Latch: Too sleepy or reluctant, no latch achieved, no sucking elicited. Intervention(s): Skin to skin;Teach feeding cues;Waking techniques  Audible Swallowing: None  Type of Nipple: Everted at rest and after stimulation  Comfort (Breast/Nipple): Soft / non-tender     Hold (Positioning): Assistance needed to correctly position infant at breast and maintain latch. Intervention(s): Breastfeeding basics reviewed;Support Pillows;Position options;Skin to skin  LATCH Score: 5  Lactation Tools Discussed/Used     Consult Status Consult Status: Follow-up Date: 11/03/14 Follow-up type: In-patient    Justice Britain 11/02/2014, 9:14 PM

## 2014-11-02 NOTE — Progress Notes (Signed)
Natia Fahmy MRN: [id number]  Subjective: -Patient pushing with contractions.  Consents signed and in waterbirth tub.   Objective: BP 146/81 mmHg  Pulse 63  Temp(Src) 97.4 F (36.3 C) (Oral)  Resp 20  Ht 5\' 2"  (1.575 m)  Wt 195 lb (88.451 kg)  BMI 35.66 kg/m2     FHT: 120 bpm, by doppler UC:  Palpates moderate Q1-49mins  SVE:   Dilation: 10 Effacement (%): 100 Station: +1 Exam by:: Gavin Pound, CNM Membranes: SROM x 6 hrs Pitocin: None  Assessment:  IUP at 39.2wks Cat I FT 2nd Stage Labor   Plan: Marlou Porch, CNM updated on patient status  -Patient may push in waterbirth tub -Continue other mgmt as ordered  Megann Easterwood LYNN,MSN, CNM 11/02/2014, 9:54 AM

## 2014-11-03 LAB — CBC
HEMATOCRIT: 27.5 % — AB (ref 36.0–46.0)
HEMOGLOBIN: 9.4 g/dL — AB (ref 12.0–15.0)
MCH: 31.4 pg (ref 26.0–34.0)
MCHC: 34.2 g/dL (ref 30.0–36.0)
MCV: 92 fL (ref 78.0–100.0)
Platelets: 136 10*3/uL — ABNORMAL LOW (ref 150–400)
RBC: 2.99 MIL/uL — ABNORMAL LOW (ref 3.87–5.11)
RDW: 13.6 % (ref 11.5–15.5)
WBC: 9.8 10*3/uL (ref 4.0–10.5)

## 2014-11-03 MED ORDER — FERROUS SULFATE 325 (65 FE) MG PO TABS
325.0000 mg | ORAL_TABLET | Freq: Two times a day (BID) | ORAL | Status: DC
  Filled 2014-11-03: qty 1

## 2014-11-03 NOTE — Lactation Note (Signed)
This note was copied from the chart of Margaret Miller. Lactation Consultation Note Follow up visit at 32 hours of age.  Baby has had 2 voids and 5 stools, but not had a feeding sine 2112 last pm.  Few drops to baby's mouth at breast, but no latch.  Discussed need to use DEBP for stimulation and concerns regarding supplementation.  DEBP set up with instructions on use, cleaning and storage.  Encouraged pumping on preemie setting every 2 hours to attemept to collect colostrum to give to baby.  Mom will also do hand expression after pump attempts.    Baby is not sucking well on gloved finger.  Baby bites and is tongue thrusting.  Suck training instructions given to parents to try.  Baby can stick his tongue out past his lower gumline with mouth almost closed and is noted to have heart shaped tip to tongue.  Small tight frenulum visible on posterior tongue likely limiting mobility and ability to latch to breast.  Mom is only able to collect 2 drops of colostrum, finger fed with curved tip syringe to baby.  Assisted with fitting for #20 nipple shield. Baby attempts latch but does not maintain suck pattern for more than a few seconds.  Encouraged mom to hold baby STS at breast and offer if baby shows interest.  Mom to pump again and if she isn't collecting for baby formula will be used inside nipple shield. Mom agrees with plan of care.  LC to follow with next feeding.     Patient Name: Margaret Miki Labuda [id number] Date: 11/03/2014 Reason for consult: Follow-up assessment;Difficult latch   Maternal Data Has patient been taught Hand Expression?: Yes Does the patient have breastfeeding experience prior to this delivery?: No  Feeding Feeding Type: Breast Fed  LATCH Score/Interventions Latch: Too sleepy or reluctant, no latch achieved, no sucking elicited. Intervention(s): Skin to skin;Teach feeding cues;Waking techniques  Audible Swallowing: None  Type of Nipple: Everted at rest and after  stimulation  Comfort (Breast/Nipple): Soft / non-tender     Hold (Positioning): Assistance needed to correctly position infant at breast and maintain latch. Intervention(s): Position options;Skin to skin;Support Pillows;Breastfeeding basics reviewed  LATCH Score: 5  Lactation Tools Discussed/Used Tools: Nipple Shields Nipple shield size: 20 Pump Review: Setup, frequency, and cleaning Initiated by:: JS Date initiated:: 11/03/14   Consult Status Consult Status: Follow-up Date: 11/04/14 Follow-up type: In-patient    Justice Britain 11/03/2014, 7:34 PM

## 2014-11-03 NOTE — Lactation Note (Signed)
This note was copied from the chart of Margaret Miller. Lactation Consultation Note Follow up visit at 34 hours of age.  Mom finished pumping and collected few drops with hand expression.  Introduced pregestamil formula with syringe filling NS to encouraged baby to latch.  Baby has loose latch and does not suck when latched.  Possible due to poor tongue mobility and unable to hold breast.  Attempted finger feeding with curve tipped syringe but baby is not closing lips tightly to feed properly.  Suggested nipple feeding from bottle to give baby calories at now 34 hours and baby has not had a good feeding.  Baby has poor suck with gaggy and finally took 8mls from bottle.  Discussed feeding plan with mom.  She plans to prepump for 15 minutes and hand express.  She will offer colostrum collected as appetizer for baby and then prefill NS to encouraged latching at the breast.  FOB will attempt syringe feeding at the breast if baby latches well.  IF not baby will then be bottle fed formula.  Feeding parameters given for age.  Encouraged mom to pump every 2 hours so baby will be offered feedings frequently and to encouraged milk production for mom.  Mom is content with plan of care.  Report give to [facility] RN.  Baby appears to have somewhat decreased tone likely due to poor calorie intake.  Baby to be seen by peds in the morning.      Patient Name: Margaret Miller [id number] Date: 11/03/2014 Reason for consult: Follow-up assessment;Difficult latch   Maternal Data Has patient been taught Hand Expression?: Yes Does the patient have breastfeeding experience prior to this delivery?: No  Feeding Feeding Type: Breast Fed  LATCH Score/Interventions Latch: Too sleepy or reluctant, no latch achieved, no sucking elicited. Intervention(s): Skin to skin;Teach feeding cues;Waking techniques  Audible Swallowing: None  Type of Nipple: Everted at rest and after stimulation Intervention(s): Double electric pump  Comfort  (Breast/Nipple): Soft / non-tender     Hold (Positioning): No assistance needed to correctly position infant at breast. Intervention(s): Position options;Skin to skin;Support Pillows;Breastfeeding basics reviewed  LATCH Score: 6  Lactation Tools Discussed/Used Tools: Nipple Shields Nipple shield size: 20 Pump Review: Setup, frequency, and cleaning Initiated by:: JS Date initiated:: 11/03/14   Consult Status Consult Status: Follow-up Date: 11/04/14 Follow-up type: In-patient    Justice Britain 11/03/2014, 9:33 PM

## 2014-11-03 NOTE — Progress Notes (Signed)
Margaret Miller   Subjective: Post Partum Day 1 Vaginal delivery,3rd degree, Periclitoral with extension into the labia 2 degree laceration Patient up ad lib, denies syncope1 Vaginal delivery, 2 degree or dizziness. Reports consuming regular diet without issues and denies N/V No issues with urination and reports bleeding is appropriate  Feeding:  breast Contraceptive plan:   unsure  Objective: Temp:  [97.4 F (36.3 C)-98.5 F (36.9 C)] 98.1 F (36.7 C) (12/30 0604) Pulse Rate:  [63-109] 82 (12/30 0604) Resp:  [18-20] 18 (12/30 0604) BP: (122-150)/(50-114) 130/71 mmHg (12/30 0604) Weight:  [195 lb (88.451 kg)] 195 lb (88.451 kg) (12/29 0925)  Physical Exam:  General: alert and cooperative Ext: WNL, no edema. No evidence of DVT seen on physical exam. Breast: Soft filling Lungs: CTAB Heart RRR without murmur  Abdomen:  Soft, fundus firm, lochia scant, + bowel sounds, non distended, non tender Lochia: appropriate Uterine Fundus: firm Laceration: healing well    Recent Labs  11/02/14 0900 11/03/14 0554  HGB 13.9 9.4*  HCT 39.2 27.5*    Assessment S/P Vaginal Delivery-Day 1 Stable  Normal Involution Breastfeeding Circumcision: OP  Plan: Continue current care Plan for discharge tomorrow, Breastfeeding and Lactation consult Lactation support   Margaret Miller, CNM, MSN 11/03/2014, 7:45 AM

## 2014-11-04 ENCOUNTER — Encounter (HOSPITAL_COMMUNITY): Payer: Self-pay | Admitting: *Deleted

## 2014-11-04 NOTE — Discharge Instructions (Signed)
Care of a Perineal Tear A perineal tear is a cut (laceration) in the tissue between the opening of the vagina and the anus (perineum). Some women naturally develop a perineal tear during a vaginal birth. This can happen as the baby emerges from the birth canal and the perineum is stretched. Perineal tears are graded based on how deep and long the laceration is. The grading for perineal tears is as follows:  First degree. This involves a shallow tear at the edge of the vaginal opening that extends slightly into the perineal skin.  Second degree. This involves tearing described in a first degree perineal tear and also a deeper tear of the vaginal opening and perineal tissues. It may also include tearing of a muscle just under the perineal skin.  Third degree. This involves tearing described in a first and second degree perineal tear, with the tear extending into the muscle of the anus (anal sphincter).  Fourth degree. This involves all levels of tear described for first, second, and third degree perineal tear, with the tear extending into the rectum. First degree perineal tears may or may not be stitched closed, depending on their location and appearance. Second, third, and fourth degree perineal tears are stitched closed immediately after the baby's birth. RISKS AND COMPLICATIONS Depending on the type of perineal tear you have, you may be at risk for the following:  Bleeding.  Developing a collection of blood in the perineal tear area (hematoma).  Pain. This may include pain with urination or bowel movements.  Infection at the site of the tear.  Fever.  Trouble controlling your bowels (fecal incontinence).  Painful sexual intercourse. HOW TO CARE FOR A PERINEAL TEAR  The first day, put ice on the area of the tear.  Put ice in a plastic bag.  Place a towel between your skin and the bag.  Leave the ice on for 20 minutes, 2-3 times a day.  Bathe using a warm sitz bath as directed by  your health care provider. This can speed up healing. Sitz baths can be performed in your bathtub or using a sitz bath kit that fits over your toilet.  Place 3-4 in. (7.6-10 cm) of warm water in your bathtub or fill the sitz bath over-the-toilet container with warm water. Make sure the water is not too hot by placing a drop on your wrist.  Sit in the warm water for 20-30 minutes.  After bathing, pat your perineum dry with a clean towel. Do not scrub the perineum as this could cause pain, irritation, or open any stitches you may have.  Keep the over-the-toilet sitz bath container clean by rinsing it thoroughly after each use. Ask for help in keeping the bathtub clean with diluted bleach and water (2 Tbsp [30 mL] of bleach to  gal [1.9 L] of water).  Repeat the sitz bath as often as you would like to relieve perineal pain, itching, or discomfort.  Apply a numbing spray to the perineal tear site as directed by your health care provider. This may help with discomfort.  Wash your hands before and after applying medicine to the area.  Put about 3 witch hazel-containing hemorrhoid treatment pads on top of your sanitary pad. The witch hazel in the hemorrhoid pads helps with discomfort and swelling.  Get a squeeze bottle to squeeze warm water on your perineum when urinating, spraying the area from front to back. Pat the area to dry it.  Sitting on an inflatable ring or pillow  may provide comfort.  Take medicines only as directed by your health care provider.  Do not have sexual intercourse or use tampons until your health care provider says it is okay. Typically, you must wait at least 6 weeks.  Keep all postpartum appointments as directed by your health care provider. SEEK MEDICAL CARE IF:   Your pain is not relieved with medicines.  You have painful urination.  You have a fever. SEEK IMMEDIATE MEDICAL CARE IF:   You have redness, swelling, or increasing pain in the area of the  tear.  You have pus coming from the area of the tear.  You notice a bad smell coming from the area of the tear.  Your tear opens.  You notice swelling in the area of the tear that is larger than when you left the hospital.  You cannot urinate. Document Released: 03/08/2014 Document Reviewed: 07/28/2013 [facility] Patient Information 2015 [place]. This information is not intended to replace advice given to you by your health care provider. Make sure you discuss any questions you have with your health care provider. Postpartum Care After Vaginal Delivery After you deliver your newborn (postpartum period), the usual stay in the hospital is 24-72 hours. If there were problems with your labor or delivery, or if you have other medical problems, you might be in the hospital longer.  While you are in the hospital, you will receive help and instructions on how to care for yourself and your newborn during the postpartum period.  While you are in the hospital:  Be sure to tell your nurses if you have pain or discomfort, as well as where you feel the pain and what makes the pain worse.  If you had an incision made near your vagina (episiotomy) or if you had some tearing during delivery, the nurses may put ice packs on your episiotomy or tear. The ice packs may help to reduce the pain and swelling.  If you are breastfeeding, you may feel uncomfortable contractions of your uterus for a couple of weeks. This is normal. The contractions help your uterus get back to normal size.  It is normal to have some bleeding after delivery.  For the first 1-3 days after delivery, the flow is red and the amount may be similar to a period.  It is common for the flow to start and stop.  In the first few days, you may pass some small clots. Let your nurses know if you begin to pass large clots or your flow increases.  Do not  flush blood clots down the toilet before having the nurse look at them.  During the  next 3-10 days after delivery, your flow should become more watery and pink or brown-tinged in color.  Ten to fourteen days after delivery, your flow should be a small amount of yellowish-white discharge.  The amount of your flow will decrease over the first few weeks after delivery. Your flow may stop in 6-8 weeks. Most women have had their flow stop by 12 weeks after delivery.  You should change your sanitary pads frequently.  Wash your hands thoroughly with soap and water for at least 20 seconds after changing pads, using the toilet, or before holding or feeding your newborn.  You should feel like you need to empty your bladder within the first 6-8 hours after delivery.  In case you become weak, lightheaded, or faint, call your nurse before you get out of bed for the first time and before you  take a shower for the first time.  Within the first few days after delivery, your breasts may begin to feel tender and full. This is called engorgement. Breast tenderness usually goes away within 48-72 hours after engorgement occurs. You may also notice milk leaking from your breasts. If you are not breastfeeding, do not stimulate your breasts. Breast stimulation can make your breasts produce more milk.  Spending as much time as possible with your newborn is very important. During this time, you and your newborn can feel close and get to know each other. Having your newborn stay in your room (rooming in) will help to strengthen the bond with your newborn. It will give you time to get to know your newborn and become comfortable caring for your newborn.  Your hormones change after delivery. Sometimes the hormone changes can temporarily cause you to feel sad or tearful. These feelings should not last more than a few days. If these feelings last longer than that, you should talk to your caregiver.  If desired, talk to your caregiver about methods of family planning or contraception.  Talk to your caregiver  about immunizations. Your caregiver may want you to have the following immunizations before leaving the hospital:  Tetanus, diphtheria, and pertussis (Tdap) or tetanus and diphtheria (Td) immunization. It is very important that you and your family (including grandparents) or others caring for your newborn are up-to-date with the Tdap or Td immunizations. The Tdap or Td immunization can help protect your newborn from getting ill.  Rubella immunization.  Varicella (chickenpox) immunization.  Influenza immunization. You should receive this annual immunization if you did not receive the immunization during your pregnancy. Document Released: 08/19/2007 Document Revised: 07/16/2012 Document Reviewed: 06/18/2012 [facility] Patient Information 2015 [place], [place]. This information is not intended to replace advice given to you by your health care provider. Make sure you discuss any questions you have with your health care provider.

## 2014-11-04 NOTE — Discharge Summary (Signed)
Vaginal Delivery Discharge Summary  Margaret Miller  DOB:    Oct 16, 1976 MRN:    [id number] CSN:    [id number]  Date of admission:                  11/02/14  Date of discharge:                   11/04/14  Procedures this admission:   SVB/waterbirth, repair of 3rd degree laceration  Date of Delivery: 11/02/14  Newborn Data:  Live born female  Birth Weight: 7 lb 7.6 oz (3390 g) APGAR: 9, 9  Home with mother. Name: Margaret Miller Circumcision Plan: Outpatient  History of Present Illness:  Ms. Margaret Miller is a 38 y.o. female, G1P1001, who presents at [redacted]w[redacted]d weeks gestation. The patient has been followed at the [facility] and Gynecology division of [facility]. She was admitted onset of labor. Her pregnancy has been complicated by:  Patient Active Problem List   Diagnosis Date Noted  . SVD (spontaneous vaginal delivery), Successful Waterbirth 11/02/2014  . Third degree perineal laceration during delivery 11/02/2014  . Hypothyroidism 11/02/2014     Hospital Course:  Admitted 11/02/14 in active labor, planning waterbirth. Positive GBS, received single dose of Clindamycin.  Progressed rapidly to delivery. Utilized waterbirth tub for pain management.  Delivery was performed by Gavin Pound, CNM, without complication. Patient and baby tolerated the procedure without difficulty, with 3rd degree perineal laceration and peri-clitoral laceration extending into labia noted--these were repaired by Dr. Mancel Bale. Infant status was stable and remained in room with mother.  Mother and infant then had an uncomplicated postpartum course, with breast feeding going slowly, due to issues with latch. Mom's physical exam was WNL, and she was discharged home in stable condition. Contraception plan was undecided at the time of d/c.  She received adequate benefit from po pain medications and elected to use OTC Ibuprophen.  We will see her in 2 weeks at [facility] for perineal  recheck.   Feeding:  breast  Contraception:  Undecided  Discharge hemoglobin:  HEMOGLOBIN  Date Value Ref Range Status  11/03/2014 9.4* 12.0 - 15.0 g/dL Final    Comment:    DELTA CHECK NOTED REPEATED TO VERIFY    HCT  Date Value Ref Range Status  11/03/2014 27.5* 36.0 - 46.0 % Final    Discharge Physical Exam:   General: alert Lochia: appropriate Uterine Fundus: firm Incision: healing well DVT Evaluation: No evidence of DVT seen on physical exam. Negative Homan's sign.  Intrapartum Procedures: spontaneous vaginal delivery Postpartum Procedures: none Complications-Operative and Postpartum: 3rd degree perineal lacerationand peri-clitoral laceration, extending into labia  Discharge Diagnoses: Term Pregnancy-delivered and GBS positive  Discharge Information:  Activity:           pelvic rest Diet:                routine Medications: Ibuprofen OTC, Dr. Pollie Friar Nipple Cream (called to [facility]). Condition:      stable Instructions:     Discharge to: home  Follow-up Information    Follow up with [facility]. Schedule an appointment as soon as possible for a visit in 2 weeks.   Specialty:  Obstetrics and Gynecology   Why:  Call office to schedule 2 week follow up for check of perineal healing.  Call for any questions or concerns.   Contact information:   [address] [place] [address] [phone]  Donnel Saxon CNM 11/04/2014 8:15 AM

## 2014-11-04 NOTE — Lactation Note (Addendum)
This note was copied from the chart of Margaret Miller. Lactation Consultation Note  Patient Name: Margaret Miller Date: 11/04/2014 Reason for consult: Follow-up assessment  Baby 49 hours of life. Parents report baby not interested in nursing. Parents given supplementation guidelines and enc to offer greater volume of supplementation with each feeding. Also discussed importance of offering breast first, then supplementing, and then post-pumping and using EBM for the next feeding. Assisted mom to latch baby in football position to right breast. Demonstrated to mom how to teacup her breast in order to assist the baby to latch deeply. Enc mom to hand express colostrum before attempting to latch in order to enc baby to latch deeply too. Mom able to express drops of colostrum. Baby suckled once each time he was latched X3, but baby is not willing to latch. Enc mom to offer lots of STS, nurse with cues, and at least every 3 hours.   Plan is for mom to offer breast first, supplement with EBM/formula according to guidelines, and then to post-pump for 15 minutes. Enc parents to feed baby sooner and gradually increase amounts according to guidelines rather than increasing supplementation amounts too quickly.   Discussed engorgement prevention/treatment, and mom aware of OP/BFSG and [place] phone line assistance after D/C.   Discussed assessment, interventions, and plan with pediatrician, Dr. Nevada Crane and Salomon Mast, RN [place].   Maternal Data    Feeding Feeding Type: Formula Nipple Type: Slow - flow Length of feed: 0 min  LATCH Score/Interventions Latch: Too sleepy or reluctant, no latch achieved, no sucking elicited. Intervention(s): Skin to skin;Teach feeding cues;Waking techniques Intervention(s): Adjust position;Assist with latch;Breast massage;Breast compression  Audible Swallowing: None Intervention(s): Skin to skin;Hand expression  Type of Nipple: Everted at rest and after  stimulation Intervention(s): Double electric pump  Comfort (Breast/Nipple): Soft / non-tender     Hold (Positioning): Assistance needed to correctly position infant at breast and maintain latch. Intervention(s): Support Pillows;Breastfeeding basics reviewed;Position options;Skin to skin  LATCH Score: 5  Lactation Tools Discussed/Used Tools: Nipple [place] Fuel;Pump Breast pump type: Double-Electric Breast Pump   Consult Status Consult Status: Complete    Margaret Miller 11/04/2014, 11:51 AM

## 2014-11-20 ENCOUNTER — Encounter (HOSPITAL_COMMUNITY): Payer: Self-pay | Admitting: *Deleted

## 2014-11-20 ENCOUNTER — Inpatient Hospital Stay (HOSPITAL_COMMUNITY)
Admission: AD | Admit: 2014-11-20 | Discharge: 2014-11-20 | Disposition: A | Discharge status: Home / Self Care | Payer: 59 | Source: Ambulatory Visit | Attending: Obstetrics and Gynecology | Admitting: Obstetrics and Gynecology

## 2014-11-20 DIAGNOSIS — N898 Other specified noninflammatory disorders of vagina: Secondary | ICD-10-CM | POA: Diagnosis not present

## 2014-11-20 DIAGNOSIS — O9089 Other complications of the puerperium, not elsewhere classified: Secondary | ICD-10-CM | POA: Insufficient documentation

## 2014-11-20 LAB — WET PREP, GENITAL
Clue Cells Wet Prep HPF POC: NONE SEEN
Trich, Wet Prep: NONE SEEN
Yeast Wet Prep HPF POC: NONE SEEN

## 2014-11-20 NOTE — MAU Note (Signed)
Pt states here for eval of perineum. Had infection and rcvd abx, which she took and was getting better. Now perineum has a slight odor and is more tender.

## 2014-11-20 NOTE — MAU Note (Signed)
Pt had an infection along her repair and a few days ago it began to itch a little, got a little tender and developed an odor.  Was sent in to be evaluated.

## 2014-11-20 NOTE — Discharge Instructions (Signed)
Care of a Perineal Tear A perineal tear is a cut (laceration) in the tissue between the opening of the vagina and the anus (perineum). Some women naturally develop a perineal tear during a vaginal birth. This can happen as the baby emerges from the birth canal and the perineum is stretched. Perineal tears are graded based on how deep and long the laceration is. The grading for perineal tears is as follows:  First degree. This involves a shallow tear at the edge of the vaginal opening that extends slightly into the perineal skin.  Second degree. This involves tearing described in a first degree perineal tear and also a deeper tear of the vaginal opening and perineal tissues. It may also include tearing of a muscle just under the perineal skin.  Third degree. This involves tearing described in a first and second degree perineal tear, with the tear extending into the muscle of the anus (anal sphincter).  Fourth degree. This involves all levels of tear described for first, second, and third degree perineal tear, with the tear extending into the rectum. First degree perineal tears may or may not be stitched closed, depending on their location and appearance. Second, third, and fourth degree perineal tears are stitched closed immediately after the baby's birth. RISKS AND COMPLICATIONS Depending on the type of perineal tear you have, you may be at risk for the following:  Bleeding.  Developing a collection of blood in the perineal tear area (hematoma).  Pain. This may include pain with urination or bowel movements.  Infection at the site of the tear.  Fever.  Trouble controlling your bowels (fecal incontinence).  Painful sexual intercourse. HOW TO CARE FOR A PERINEAL TEAR  The first day, put ice on the area of the tear.  Put ice in a plastic bag.  Place a towel between your skin and the bag.  Leave the ice on for 20 minutes, 2-3 times a day.  Bathe using a warm sitz bath as directed by  your health care provider. This can speed up healing. Sitz baths can be performed in your bathtub or using a sitz bath kit that fits over your toilet.  Place 3-4 in. (7.6-10 cm) of warm water in your bathtub or fill the sitz bath over-the-toilet container with warm water. Make sure the water is not too hot by placing a drop on your wrist.  Sit in the warm water for 20-30 minutes.  After bathing, pat your perineum dry with a clean towel. Do not scrub the perineum as this could cause pain, irritation, or open any stitches you may have.  Keep the over-the-toilet sitz bath container clean by rinsing it thoroughly after each use. Ask for help in keeping the bathtub clean with diluted bleach and water (2 Tbsp [30 mL] of bleach to  gal [1.9 L] of water).  Repeat the sitz bath as often as you would like to relieve perineal pain, itching, or discomfort.  Apply a numbing spray to the perineal tear site as directed by your health care provider. This may help with discomfort.  Wash your hands before and after applying medicine to the area.  Put about 3 witch hazel-containing hemorrhoid treatment pads on top of your sanitary pad. The witch hazel in the hemorrhoid pads helps with discomfort and swelling.  Get a squeeze bottle to squeeze warm water on your perineum when urinating, spraying the area from front to back. Pat the area to dry it.  Sitting on an inflatable ring or pillow  may provide comfort.  Take medicines only as directed by your health care provider.  Do not have sexual intercourse or use tampons until your health care provider says it is okay. Typically, you must wait at least 6 weeks.  Keep all postpartum appointments as directed by your health care provider. SEEK MEDICAL CARE IF:   Your pain is not relieved with medicines.  You have painful urination.  You have a fever. SEEK IMMEDIATE MEDICAL CARE IF:   You have redness, swelling, or increasing pain in the area of the  tear.  You have pus coming from the area of the tear.  You notice a bad smell coming from the area of the tear.  Your tear opens.  You notice swelling in the area of the tear that is larger than when you left the hospital.  You cannot urinate. Document Released: 03/08/2014 Document Reviewed: 07/28/2013 [facility] Patient Information 2015 [place]. This information is not intended to replace advice given to you by your health care provider. Make sure you discuss any questions you have with your health care provider.

## 2014-11-20 NOTE — MAU Provider Note (Signed)
History   39 yo G1P1 at 18 days s/p SVB (waterbirth), with 3rd degree laceration.  Initially repaired by Dr. Mancel Bale.  Had mild infection in laceration site, seen by Dr. Mancel Bale on 1/4, and treated with sitz baths and course of Keflex.  Rechecked on 1/11 by Dr. Mancel Bale, with appropriate healing noted.  Now noting slightly more itching of area, with small amount discharge.  "Just didn't want anything to get worse".  Seen at Urgent Care today for persistent itching and rash on hands x several days.  Likely contact dermatitis, with Rx for hydrocortisone.  Patient Active Problem List   Diagnosis Date Noted  . Vaginal discharge 11/20/2014  . SVD (spontaneous vaginal delivery), Successful Waterbirth 11/02/2014  . Third degree perineal laceration during delivery 11/02/2014  . Hypothyroidism 11/02/2014    Chief Complaint  Patient presents with  . Postpartum Complications   HPI:  See above  OB History    Gravida Para Term Preterm AB TAB SAB Ectopic Multiple Living   1 1 1  0 0 0 0 0 0 1      Past Medical History  Diagnosis Date  . Hypothyroid   . Seasonal allergies   . Anemia   . Reflux   . Vitamin D deficiency   . Magnesium deficiency   . Hx of cholecystectomy   . Hx of gastric bypass     Past Surgical History  Procedure Laterality Date  . Gastric bypass    . Tonsillectomy    . Cholestectomy    . Fracture surgery      History reviewed. No pertinent family history.  History  Substance Use Topics  . Smoking status: Never Smoker   . Smokeless tobacco: Never Used  . Alcohol Use: No    Allergies:  Allergies  Allergen Reactions  . Penicillins Rash    Prescriptions prior to admission  Medication Sig Dispense Refill Last Dose  . Ascorbic Acid (VITAMIN C PO) Take 1 tablet by mouth daily.   Past Week at Unknown time  . CALCIUM PO Take 1 tablet by mouth.   Past Week at Unknown time  . Cholecalciferol (VITAMIN D3) 2000 UNITS TABS Take 1 tablet by mouth daily.   11/01/2014  at Unknown time  . IRON PO Take 1 tablet by mouth daily.   Past Week at Unknown time  . levothyroxine (SYNTHROID, LEVOTHROID) 100 MCG tablet Take 100 mcg by mouth daily before breakfast.   11/01/2014 at Unknown time  . loratadine (CLARITIN) 10 MG tablet Take 10 mg by mouth daily.   Past Week at Unknown time  . MAGNESIUM PO Take 1 tablet by mouth daily.   Past Week at Unknown time  . omega-3 acid ethyl esters (LOVAZA) 1 G capsule Take 1 g by mouth daily.   Past Week at Unknown time  . Prenatal Vit-Fe Fumarate-FA (PRENATAL MULTIVITAMIN) TABS tablet Take 1 tablet by mouth daily at 12 noon.   11/01/2014 at Unknown time    ROS:  Vaginal d/c Physical Exam   Blood pressure 104/81, pulse 130, temperature 97.7 F (36.5 C), temperature source Oral, resp. rate 18, unknown if currently breastfeeding.   Physical Exam  Genitourinary:       In NAD Chest clear  Heart RRR without murmur Abd soft, NT, well-involuted Perineum--See drawing Left peri-clitoral laceration, extending to left labia healing well.  No sx infection or wound disruption 3rd degree perineal laceration healing well.  Small point of possible granulation issue noted at introitus.  Small protrusion  of scar tissue noted on perineal body, approx 1 cm long.  Mildly tender, no cellulitis noted. No evidence of wound disruption, erythema, or cellulitis. Small amount thin d/c in vault. Hands--erythematous, with red rash noted.     ED Course  Assessment: 18 days s/p SVB 3rd degree laceration--healing appropriately Vaginal d/c  Plan: Wet prep Recommend patient continue sitz baths at least BID Kegels.   Donnel Saxon CNM, MSN 11/20/2014 4:45 PM  Addendum: Results for orders placed or performed during the hospital encounter of 11/20/14 (from the past 24 hour(s))  Wet prep, genital     Status: Abnormal   Collection Time: 11/20/14  4:43 PM  Result Value Ref Range   Yeast Wet Prep HPF POC NONE SEEN NONE SEEN   Trich, Wet Prep  NONE SEEN NONE SEEN   Clue Cells Wet Prep HPF POC NONE SEEN NONE SEEN   WBC, Wet Prep HPF POC MODERATE (A) NONE SEEN   Impression: Healing 3rd degree laceration--no evidence infection or disruption.  Plan: D/c home Continue perineal care regimen, with increase in sitz baths and Kegels. Keep scheduled pp appt on 2/1, or call prn with any issues.  Donnel Saxon, CNM 11/20/14 5:30p

## 2014-11-23 ENCOUNTER — Other Ambulatory Visit: Admit: 2014-11-23 | Discharge: 2014-11-23 | Payer: PRIVATE HEALTH INSURANCE | Primary: Family

## 2014-11-23 DIAGNOSIS — R300 Dysuria: Secondary | ICD-10-CM

## 2014-11-23 LAB — URINE CULTURE: Culture Result: >100000

## 2014-11-24 MED ORDER — liothyronine (CYTOMEL) 5 MCG tablet
5 | ORAL_TABLET | Freq: Every day | ORAL | Status: DC
Start: 2014-11-24 — End: 2016-02-20

## 2014-11-24 NOTE — Telephone Encounter (Signed)
From: [name]  To: [name], MD  Sent: 11/24/2014 12:59 PM PST  Subject: Medication Question    dr hills,    since dr Leonia Reader is no longer in town I need an rx refill of liothyronine 5 MCG tablet. I have 3 pills left. can you refill this or do I need to make an appointment to come in and talk to you about it. thank you.

## 2014-11-25 MED ORDER — ciprofloxacin HCl (CIPRO) 500 MG tablet
500 | ORAL_TABLET | Freq: Two times a day (BID) | ORAL | 0.00 refills | 9.00000 days | Status: DC
Start: 2014-11-25 — End: 2014-11-26

## 2014-11-26 MED ORDER — ciprofloxacin HCl (CIPRO) 500 MG tablet
500 | ORAL_TABLET | Freq: Two times a day (BID) | ORAL | 0.00 refills | 9.00000 days | Status: DC
Start: 2014-11-26 — End: 2017-03-12

## 2014-11-26 NOTE — Telephone Encounter (Signed)
-----   Message from [name], MD sent at 11/24/2014  4:26 PM PST -----  The urine culture is showing >100,000 E Coli.  No sensitivities as of yet.  Patient is allergic to Penicillins.  Lets start CIPRO 500mg  po BID for 7 days please; 14 tablets and no refills.  We may need to change the antibiotics according to the sensitivities when available.  Thanks so much.  Please call patient and let her know and please prescribe the CIPRO for the patient.

## 2014-11-26 NOTE — Telephone Encounter (Signed)
Called and let [name] know urine culture and that CIPRO was ordered i sent [facility] to [facility] for her.

## 2015-04-20 MED ORDER — levothyroxine (LEVOXYL) 150 MCG tablet
150 | ORAL_TABLET | Freq: Every day | ORAL | Status: DC
Start: 2015-04-20 — End: 2015-08-09

## 2015-04-20 NOTE — Telephone Encounter (Signed)
From: [name]  To: [name], MD  Sent: 04/20/2015 8:22 AM PDT  Subject: Medication Question    Dr Loleta Chance,    I need to get a refill of levothyroxine 150 MCG tablet. My last refill came up but Dr. Leonia Reader is the one that the precribing Doctor. Can you please send it to the [facility]. I have been out for almost a week now.    thank you,  Kerry Allen

## 2015-05-05 ENCOUNTER — Encounter: Payer: PRIVATE HEALTH INSURANCE | Attending: MD | Primary: Family

## 2015-05-16 NOTE — Telephone Encounter (Signed)
All labs except TPO and/or RT3 ordered as I'm not sure what these labs are and not in epic.

## 2015-05-16 NOTE — Telephone Encounter (Signed)
Left message on machine informing that request has been ordered. Appointment has been scheduled for 05/30/15 and will call back if she has questions or concerns.

## 2015-05-16 NOTE — Telephone Encounter (Signed)
Regarding: Non-Urgent Medical Question  Contact: [phone]  ----- Message from Allena Napoleon, MA sent at 05/16/2015  1:39 PM PDT -----       ----- Message from [name] to [name], MD sent at 05/16/2015  1:10 PM -----   I would like to get some labs done for my thryorid. Dr. Leonia Reader did do these last time and that was why he put me on a T3/ T4 medication. TSH, T4, T3/FT3, RT3, TPO. I would also like the normal labs that i usually get also. If i have to wait for my appt no problem but i was told by the MA just to email you regardining the labs since they had to reschedule my appt from last month.     thank you,    Kerry Allen

## 2015-05-16 NOTE — Telephone Encounter (Signed)
From: [name]  To: [name], MD  Sent: 05/16/2015 1:10 PM PDT  Subject: Non-Urgent Medical Question    I would like to get some labs done for my thryorid. Dr. Leonia Reader did do these last time and that was why he put me on a T3/ T4 medication. TSH, T4, T3/FT3, RT3, TPO. I would also like the normal labs that i usually get also. If i have to wait for my appt no problem but i was told by the MA just to email you regardining the labs since they had to reschedule my appt from last month.     thank you,    Kerry Allen

## 2015-05-16 NOTE — Addendum Note (Signed)
Addended by: Allena Napoleon on: 05/16/2015 04:27 PM     Modules accepted: Orders

## 2015-05-16 NOTE — Telephone Encounter (Signed)
Would you please call this patient and reschedule her physical examination.  I had a another patient that was having a medical emergency, and thus she chose to leave.  Please tell her I am very sorry about all of that.    Labs :  TSH, free T4 and a total T3 and the additional thyroid labs per her note.  Also a CBC, CMP, lipid profile and a vitamin D level as well.  Please re-schedule her physical examination.  Thanks so much

## 2015-05-18 ENCOUNTER — Other Ambulatory Visit: Admit: 2015-05-18 | Discharge: 2015-05-18 | Payer: PRIVATE HEALTH INSURANCE | Primary: Family

## 2015-05-18 DIAGNOSIS — E063 Autoimmune thyroiditis: Secondary | ICD-10-CM

## 2015-05-18 LAB — CBC WITH MANUAL DIFF
Basophils %: 1 % (ref 0–2)
Basophils, Absolute: 0.08 10*3/uL (ref 0–0.20)
Eosinophils %: 2 % (ref 0–7)
Eosinophils, Absolute: 0.17 10*3/uL (ref 0–0.70)
HCT: 39.3 % (ref 37.0–48.0)
Hgb: 13.3 gm/dL (ref 12.0–16.0)
Lymphocytes %: 21 % — ABNORMAL LOW (ref 25–45)
Lymphocytes, Absolute: 1.76 10*3/uL (ref 1.1–4.3)
MCH: 29.3 pg (ref 27–34)
MCHC: 33.8 gm/dL (ref 32–36)
MCV: 86.6 fL (ref 81–99)
MPV: 9.7 fL (ref 7.4–10.4)
Monocytes %: 3 % (ref 0–12)
Monocytes, Absolute: 0.25 10*3/uL (ref 0–1.20)
Platelet Count: 311 10*3/uL (ref 150–400)
Platelet Estimate: NORMAL
RBC Morphology: NORMAL
RBC: 4.54 10*6/uL (ref 4.20–5.40)
RDW: 13.4 % (ref 11.5–14.5)
Segs (Neutrophils)%: 73 % — ABNORMAL HIGH (ref 35–70)
Segs, Absolute: 6.14 10*3/uL (ref 1.60–7.30)
WBC: 8.4 10*3/uL (ref 4.8–10.8)

## 2015-05-18 LAB — COMPREHENSIVE METABOLIC PANEL
ALT - Alanine Amino transferase: 38 IU/L (ref 7–52)
AST - Aspartate Aminotransferase: 23 IU/L (ref 8–39)
Albumin/Globulin Ratio: 1.1 (ref >0.9)
Albumin: 3.9 gm/dL (ref 3.5–5.0)
Alkaline Phosphatase: 74 IU/L (ref 34–104)
Anion Gap: 6.5 mmol/L (ref 3–11)
BUN: 12 mg/dL (ref 6.0–23.0)
Bilirubin, Total: 0.6 mg/dL (ref 0.3–1.2)
CO2 - Carbon Dioxide: 25.5 mmol/L (ref 21.0–31.0)
Calcium: 9.3 mg/dL (ref 8.6–10.3)
Chloride: 106 mmol/L (ref 98–111)
Creatinine, Serum: 0.87 mg/dL (ref 0.55–1.10)
GFR Estimate: >60 mL/min/{1.73_m2} (ref >60)
Globulin: 3.4 gm/dL (ref 2.2–3.7)
Glucose: 100 mg/dL — ABNORMAL HIGH (ref 80–99)
Potassium: 4 mmol/L (ref 3.5–5.1)
Protein, Total: 7.3 gm/dL (ref 6.0–8.0)
Sodium: 138 mmol/L (ref 135–143)

## 2015-05-18 LAB — GLYCO-HEMOGLOBIN A1C
Estimated Average Glucose: 108 mg/dL
Hemoglobin A1C: 5.4 % (ref 4.3–6.1)

## 2015-05-18 LAB — CORONARY RISK LIPID PANEL
Cholesterol (CRISK): 176 mg/dL (ref <200)
Cholesterol, HDL: 43 mg/dL (ref 35–92)
LDL Calculated: 113 mg/dL — ABNORMAL HIGH (ref <100)
Triglycerides (Crisk): 101 mg/dL (ref 40–210)

## 2015-05-18 LAB — 25-OH HYDROXY VITAMIN D, CALCIFEROL, TOTAL OF D2 & D3: Vitamin D, 25-OH Total: 26 ng/mL — ABNORMAL LOW (ref 30–100)

## 2015-05-18 LAB — T4, FREE: T4, Free: 1 ng/dL (ref 0.6–1.2)

## 2015-05-18 LAB — TSH: TSH - Thyroid Stimulating Hormone: 0.84 microIU/mL (ref 0.40–5.80)

## 2015-05-18 LAB — T3, FREE: T3, Free: 3.1 pg/mL (ref 2.5–3.9)

## 2015-05-19 NOTE — Telephone Encounter (Signed)
-----   Message from [name], MD sent at 05/19/2015  1:59 PM PDT -----  Please call patient and let her know that the thyroid labs (Free T3, Free T4 and TSH) are WNL at this time.  There were additional thyroid labs that she wanted as well. ( I thought that they had been ordered, but I do not see them in the lab order.)  She also wanted a TPO which is thyroid peroxidase antibody (TPO), and we should order this lab for her.  She also wanted a reverse T3 or reverse triiodothyronine.  Please order this as well.  Also her BG is in the impaired fasting glucose  and we should also obtain a Hem A1C% for this patient.  Her next appointment is 05/30/15 and I would like her to have these labs prior to that appointment please.  Thanks so much

## 2015-05-19 NOTE — Addendum Note (Signed)
Addended by: Allena Napoleon on: 05/19/2015 04:37 PM     Modules accepted: Orders

## 2015-05-19 NOTE — Telephone Encounter (Signed)
Deleted order for A1c because lab does not want [place] to place orders for add on, instead call directly.  Notified Dr. Adaline Sill by phone that her order was actually deleted.  Patient notified by MyChart.

## 2015-05-19 NOTE — Telephone Encounter (Signed)
Thanks so much.  I added the Hem A1C%.  Thank you

## 2015-05-26 ENCOUNTER — Ambulatory Visit: Admit: 2015-05-26 | Discharge: 2015-05-26 | Payer: PRIVATE HEALTH INSURANCE | Primary: Family

## 2015-05-26 DIAGNOSIS — E063 Autoimmune thyroiditis: Secondary | ICD-10-CM

## 2015-05-26 LAB — T3, REVERSE: T3, Reverse: 26 ng/dL — ABNORMAL HIGH (ref 10–24)

## 2015-05-26 LAB — THYROID PEROXIDASE ANTIBODY: Thyroid Peroxidase Antibody: 3.7 IU/mL (ref <9.0)

## 2015-05-30 ENCOUNTER — Ambulatory Visit: Admit: 2015-05-30 | Discharge: 2015-05-31 | Payer: PRIVATE HEALTH INSURANCE | Attending: MD | Primary: Family

## 2015-05-30 ENCOUNTER — Inpatient Hospital Stay: Payer: PRIVATE HEALTH INSURANCE | Attending: MD | Primary: Family

## 2015-05-30 DIAGNOSIS — Z124 Encounter for screening for malignant neoplasm of cervix: Secondary | ICD-10-CM

## 2015-05-30 DIAGNOSIS — E063 Autoimmune thyroiditis: Secondary | ICD-10-CM

## 2015-05-30 LAB — HPV HIGH RISK GENOTYPE

## 2015-05-30 NOTE — Progress Notes (Signed)
Kerry Allen is a 39 y.o. female who presents with Well Woman Exam; Anxiety; and Hyperglycemia  .       HISTORY OF PRESENT ILLNESS    HPI Comments: The patient is here today for her CPE.  Very concerned about 40 lbs weight gain over the last 1 year.  She crys when talking about her weight.  She feels awful and I am starting to feel my knees hurt.    Here for PAP and pelvic examination.  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    Also concerned about DMII Has had increased thirst.      Anxiety  Symptoms include nervous/anxious behavior. Patient reports no nausea or suicidal ideas.       Female GU Problem  The patient's pertinent negatives include no genital lesions, genital odor, pelvic pain, vaginal bleeding or vaginal discharge. Pertinent negatives include no abdominal pain, constipation, diarrhea, dysuria, fever, frequency, hematuria, nausea or rash.         10/04/14 VISIT-DR. Zoriah Pulice  ASSESSMENT & PLAN  ? ? ICD-9-CM? ICD-10-CM? ?   1.? Hashimoto's thyroiditis? 245.2? E06.3? T3, Free -Routine?   ? ? ? T4, Free -Routine?   ? ? ? Thyroid Stimulating Hormone -Routine?   2.? Hyperlipidemia? 272.4? E78.5? ?   3.? Anxiety state? 300.00? F41.1? ?   4.? 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 ?   ? 079.4? B97.7? ?   5.? Vitamin D deficiency? 268.9? E55.9? ?   6.? Hypothyroidism, unspecified? 244.9? E03.9? ?   7.? Dysfunctional uterine bleeding? 626.8? N93.8? Comprehensive Metabolic Panel -Routine?   ? ? ? CBC with Auto Differential -Routine?   ? ? ? Referral to Obstetrics / Gynecology [facility])?   8.? Malaise and fatigue? 780.79? R53.81? ?   ? ? R53.83? ?   9.? 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 We talked about avoiding using metal belt buckle.? Will follow?       Return in about 6 months (around 03/08/2015) for allergic contact dermatitis, anxiety and depression.                 Past Medical History   Diagnosis Date   . Hashimoto's thyroiditis      Hashimoto's thyroiditis with  hyperthyroidism   . Hypothyroidism      Hypothyroidism-Dr. Leonia Reader   . Vitamin D deficiency      Past Surgical History   Procedure Laterality Date   . Tubal ligation       Tubal ligation 2008     Current Outpatient Prescriptions on File Prior to Visit   Medication Sig Dispense Refill   . cholecalciferol, vitamin D3, (VITAMIN D3) 1,000 unit tablet Take 1,000 Units by mouth daily.     . ciprofloxacin HCl (CIPRO) 500 MG tablet Take 1 tablet (500 mg total) by mouth 2 (two) times daily. 14 tablet 0   . levothyroxine (LEVOXYL) 150 MCG tablet Take 1 tablet (150 mcg total) by mouth daily. Take 150 mcg by mouth Daily. 90 tablet 0   . liothyronine (CYTOMEL) 5 MCG tablet Take 5 mcg by mouth.     . liothyronine (CYTOMEL) 5 MCG tablet Take 1 tablet (5 mcg total) by mouth daily. 30 tablet 11   . LORazepam (ATIVAN) 0.5 MG tablet Take 0.5 mg by mouth.     . zolpidem (AMBIEN) 5 MG tablet 1-2 by mouth every bedtime       No current facility-administered medications on file prior to visit.  Allergies   Allergen Reactions   . Penicillins Other (See Comments)     History     Social History Main Topics   . Smoking status: Passive Smoke Exposure - Never Smoker   . Smokeless tobacco: Not on file      Comment: parents smoked   . Alcohol Use: 0.0 oz/week     0 Standard drinks or equivalent per week      Comment: occasional    . Drug Use: No   . Sexual Activity: Not on file     Social History   . Marital Status: Married     Spouse Name: N/A   . Number of Children: N/A   . Years of Education: N/A     Other Topics Concern   . Caffeine Concern Yes     moderate   . Exercise Yes     occasional        Appointment on 05/26/2015   Component Date Value Ref Range Status   . Thyroid Peroxidase Antibody 05/26/2015 3.7  <9.0 IU/mL Final   . T3, Reverse 05/26/2015 26* 10 - 24 ng/dL Final   Lab Walk-In on 05/18/2015   Component Date Value Ref Range Status   . T3, Free 05/18/2015 3.1  2.5 - 3.9 pg/mL Final   . T4, Free 05/18/2015 1.0  0.6 - 1.2 ng/dL Final     . TSH - Thyroid Stimulating Hormone 05/18/2015 0.84  0.40 - 5.80 microIU/mL Final   . Vit D, 25-Dihydroxy D2 05/18/2015 26* 30 - 100 ng/mL Final   . WBC 05/18/2015 8.4  4.8 - 10.8 K/microL Final   . RBC 05/18/2015 4.54  4.20 - 5.40 M/microL Final   . Hgb 05/18/2015 13.3  12.0 - 16.0 gm/dL Final   . HCT 16/08/9603 39.3  37.0 - 48.0 % Final   . MCV 05/18/2015 86.6  81 - 99 fL Final   . MCH 05/18/2015 29.3  27 - 34 pg Final   . MCHC 05/18/2015 33.8  32 - 36 gm/dL Final   . RDW 54/07/8118 13.4  11.5 - 14.5 % Final   . Platelet Count 05/18/2015 311  150 - 400 K/microL Final   . MPV 05/18/2015 9.7  7.4 - 10.4 fL Final   . Differential 05/18/2015 MICRO   Final   . Segs (Neutrophils)% 05/18/2015 73* 35 - 70 % Final   . Lymphocytes % 05/18/2015 21* 25 - 45 % Final   . Monocytes % 05/18/2015 3  0 - 12 % Final   . Eosinophils % 05/18/2015 2  0 - 7 % Final   . Basophils % 05/18/2015 1  0 - 2 % Final   . Basophils, Absolute 05/18/2015 0.08  0 - 0.20 K/microL Final   . Eosinophils, Absolute 05/18/2015 0.17  0 - 0.70 K/microL Final   . Monocytes, Absolute 05/18/2015 0.25  0 - 1.20 K/microL Final   . Platelet Estimate 05/18/2015 NORMAL  NORMAL Final   . RBC Morphology 05/18/2015 NORMAL  NORMAL Final   . Segs, Absolute 05/18/2015 6.14  1.60 - 7.30 K/microL Final   . DIFF TYPE 05/18/2015 MICRO   Final   . Lymphocytes, Absolute 05/18/2015 1.76  1.1 - 4.3 K/UL Final   . Sodium 05/18/2015 138  135 - 143 mmol/L Final   . Potassium 05/18/2015 4.0  3.5 - 5.1 mmol/L Final   . Chloride 05/18/2015 106  98 - 111 mmol/L Final   . CO2 - Carbon Dioxide 05/18/2015 25.5  21.0 - 31.0 mmol/L Final   . Glucose 05/18/2015 100* 80 - 99 mg/dL Final   . BUN 34/74/2595 12  6.0 - 23.0 mg/dL Final   . Creatinine, Serum 05/18/2015 0.87  0.55 - 1.10 mg/dL Final   . Calcium 63/87/5643 9.3  8.6 - 10.3 mg/dL Final   . AST - Aspartate Aminotransferase 05/18/2015 23  8 - 39 IU/L Final   . ALT - Alanine Amino transferase 05/18/2015 38  7 - 52 IU/L Final   .  Alkaline Phosphatase 05/18/2015 74  34 - 104 IU/L Final   . Bilirubin, Total 05/18/2015 0.6  0.3 - 1.2 mg/dL Final   . Protein, Total 05/18/2015 7.3  6.0 - 8.0 gm/dL Final   . Albumin 32/95/1884 3.9  3.5 - 5.0 gm/dL Final   . Globulin 16/60/6301 3.4  2.2 - 3.7 gm/dL Final   . Albumin/Globulin Ratio 05/18/2015 1.1  >0.9 Final   . Anion Gap 05/18/2015 6.5  3 - 11 mmol/L Final   . GFR Estimate 05/18/2015 >60  >60 mL/min/1.73sq.m Final   . GFR Additional Info 05/18/2015                                                   Final   . Cholesterol (CRISK) 05/18/2015 176  <200 mg/dL Final   . Cholesterol, HDL 05/18/2015 43  35 - 92 mg/dL Final   . Triglycerides (Crisk) 05/18/2015 101  40 - 210 mg/dL Final   . LDL Calculated 05/18/2015 601* <100 mg/dL Final   . Hemoglobin U9N 05/18/2015 5.4  4.3 - 6.1 % Final   . Estimated Average Glucose 05/18/2015 108   Final       REVIEW OF SYSTEMS  Review of Systems   Constitutional: Positive for fatigue. Negative for fever.   HENT: Positive for rhinorrhea.    Eyes: Negative.    Respiratory: Negative.    Cardiovascular: Negative.    Gastrointestinal: Negative.  Negative for nausea, abdominal pain, diarrhea and constipation.   Endocrine: Positive for polydipsia. Negative for heat intolerance.   Genitourinary: Negative.  Negative for dysuria, frequency, hematuria, vaginal discharge and pelvic pain.   Musculoskeletal: Positive for arthralgias.   Skin: Negative for color change, pallor and rash.   Allergic/Immunologic: Positive for environmental allergies. Negative for food allergies and immunocompromised state.   Neurological: Negative.    Hematological: Negative.    Psychiatric/Behavioral: Positive for dysphoric mood. Negative for suicidal ideas. The patient is nervous/anxious.        PHYSICAL EXAM  BP 118/80 mmHg  Pulse 75  Ht 5' 2 (1.575 m)  Wt 178 lb (80.74 kg)  BMI 32.55 kg/m2  SpO2 99%  Body mass index is 32.55 kg/(m^2).    Physical Exam   Constitutional: She is oriented to person,  place, and time. She appears well-developed and well-nourished. She appears distressed.   Overweight female in mild to moderate distress today due to her concerns of weight.     HENT:   Head: Normocephalic and atraumatic.   Right Ear: External ear normal. A middle ear effusion is present.   Left Ear: External ear normal. A middle ear effusion is present.   Nose: Rhinorrhea present. Right sinus exhibits no maxillary sinus tenderness and no frontal sinus tenderness. Left sinus exhibits no maxillary sinus tenderness and no frontal sinus tenderness.   Eyes:  Conjunctivae and EOM are normal. Pupils are equal, round, and reactive to light.   Neck: Normal range of motion. Neck supple.   Cardiovascular: Normal rate, regular rhythm, normal heart sounds and intact distal pulses.    Pulmonary/Chest: Effort normal and breath sounds normal.   Abdominal: Soft. Bowel sounds are normal. Hernia confirmed negative in the right inguinal area and confirmed negative in the left inguinal area.   Genitourinary: Rectum normal and uterus normal. Rectal exam shows anal tone normal. Guaiac negative stool. No breast swelling, tenderness, discharge or bleeding. Pelvic exam was performed with patient supine. There is no rash, tenderness or lesion on the right labia. There is no rash, tenderness or lesion on the left labia. Cervix exhibits no motion tenderness and no discharge. Right adnexum displays no mass and no tenderness. Left adnexum displays no mass and no tenderness. No erythema, tenderness or bleeding in the vagina. No foreign body around the vagina. No signs of injury around the vagina. No vaginal discharge found.   Musculoskeletal: Normal range of motion. She exhibits tenderness.   Neurological: She is alert and oriented to person, place, and time. She has normal reflexes.   Skin: Skin is warm and dry. No rash noted. No erythema.   Tattoos noted   Psychiatric: Her mood appears anxious. She exhibits a depressed mood.   Nursing note and  vitals reviewed.        ASSESSMENT & PLAN    ICD-9-CM ICD-10-CM    1. Hashimoto's thyroiditis 245.2 E06.3 Thyroid Stimulating Hormone -Routine      T4, Free -Routine      T3, Free -Routine      Thyroid Peroxidase Antibody -Routine      T3, Reverse -Routine   2. Vitamin D deficiency 268.9 E55.9    3. Increased thirst 783.5 R63.1 Glucose intolerance noted   4. Screening for cervical cancer V76.2 Z12.4 CYTOLOGY PAP TEST (Printed Requisition Required)      CANCELED: PAP Path/Cytology Testing (Printed Requisition Required)   5. Health care maintenance V70.0 Z00.00    6. Passive smoke exposure V15.89 Z77.22    7. BMI 32.0-32.9,adult V85.32 Z68.32    8. Screening for breast cancer V76.10 Z12.39 Mammography tomo screening bilateral digital w cad  Never had a mammo and will screen today   9. Acquired hypothyroidism 244.9 E03.9 Thyroid Stimulating Hormone -Routine      T4, Free -Routine      T3, Free -Routine      Thyroid Peroxidase Antibody -Routine      T3, Reverse -Routine   10. S/P radioactive iodine thyroid ablation V45.89 Z92.3 Thyroid Stimulating Hormone -Routine      T4, Free -Routine      T3, Free -Routine      Thyroid Peroxidase Antibody -Routine      T3, Reverse -Routine   11. Impaired fasting glucose 790.21 R73.01 Referral to Diabetes Education ([facility])      Glyco-Hemoglobin A1C -Routine      CBC with Auto Differential -Routine      Comprehensive Metabolic Panel -Routine      Coronary Risk Lipid Panel -Routine   12. Hyperlipidemia, unspecified hyperlipidemia 272.4 E78.5 Referral to Diabetes Education ([facility])      CBC with Auto Differential -Routine      Comprehensive Metabolic Panel -Routine      Coronary Risk Lipid Panel -Routine   13. 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 Abnormal weight gain  783.1 R63.5 Patient has gained 40 lbs in the last 1 year.  Will send to DM II educator and nutritionist.         Return in about 6 months (around  11/30/2015).    This note was transcribed using speech recognition software. Please contact us for clarification if any questions arise relating to the wording of this document.

## 2015-05-31 NOTE — Telephone Encounter (Signed)
Healthy Eating [facility]

## 2015-05-31 NOTE — Telephone Encounter (Signed)
This patient would like to take one of Jill's classes ([facility]) on healthy eating.  When offering this class, would you please include this patient. Thanks so much

## 2015-06-20 ENCOUNTER — Encounter: Payer: Self-pay | Admitting: Emergency Medicine

## 2015-06-22 NOTE — Telephone Encounter (Signed)
T/C to pt, LMVM,  To get new ins info since [place] [employer] closed as of 06/05/15

## 2015-06-23 NOTE — Telephone Encounter (Signed)
From: [name]  To: [name], MD  Sent: 06/23/2015 1:23 PM PDT  Subject: Non-Urgent Medical Question    i just changed my insurance company to [facility] id [id number] group # [id number]. Dr. Loleta Chance wanted me to get a mammogram and talk to the Diabetic teacher. just double checking that I dont need a referral with my new insurance.     thank you,    Curlene Labrum

## 2015-06-23 NOTE — Telephone Encounter (Signed)
Regarding: Non-Urgent Medical Question  Contact: [phone]  ----- Message from Allena Napoleon, MA sent at 06/23/2015  2:52 PM PDT -----       ----- Message from [name] to [name], MD sent at 06/23/2015  1:23 PM -----   i just changed my insurance company to [facility] id [id number] group # [phone]. Dr. Loleta Chance wanted me to get a mammogram and talk to the Diabetic teacher. just double checking that I dont need a referral with my new insurance.      thank you,    Curlene Labrum

## 2015-06-24 NOTE — Telephone Encounter (Signed)
T/C to pt to let her know that she can call [facility] @ [phone] and Diabetic ed [phone] to schedule an appt with them, she does not need a PA.

## 2015-07-07 NOTE — Telephone Encounter (Signed)
Called to schedule patient. The classes are at 4:00 and she works until 5:00 so she declined to schedule.

## 2015-07-08 ENCOUNTER — Ambulatory Visit: Admit: 2015-07-08 | Discharge: 2015-07-08 | Payer: PRIVATE HEALTH INSURANCE | Attending: RD | Primary: Family

## 2015-07-08 DIAGNOSIS — R7301 Impaired fasting glucose: Secondary | ICD-10-CM

## 2015-07-08 NOTE — Progress Notes (Signed)
Time In: 8:00am   Time: Out: 9:00am     Kerry Allen  is a 39 y.o. female, who presents today for Prediabetes    Nutrition Assessment     Client History    SNOMED CT(R)   1. Elevated fasting blood sugar  IMPAIRED FASTING GLYCAEMIA     Patient Active Problem List   Diagnosis SNOMED CT(R)   . Hypothyroidism HYPOTHYROIDISM   . Hashimoto's thyroiditis HASHIMOTO THYROIDITIS   . Vitamin D deficiency VITAMIN D DEFICIENCY   . Hyperlipidemia HYPERLIPIDEMIA   . Anxiety state ANXIETY STATE   . Dysthymia DYSTHYMIA   . Leukorrhea LEUKORRHEA   . Contact dermatitis CONTACT DERMATITIS   . Muscle pain MUSCLE PAIN   . Malaise and fatigue MALAISE AND FATIGUE   . Fatigue FATIGUE   . Abnormal weight gain ABNORMAL WEIGHT GAIN   . Generalized abdominal pain GENERALIZED ABDOMINAL PAIN   . 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 Nonvenomous insect bite of multiple sites with infection NONVENOMOUS INSECT BITE OF MULTIPLE SITES WITH INFECTION   . Allergy ALLERGIC DISPOSITION   . Increased thirst INCREASED THIRST   . Screening for cervical cancer CANCER CERVIX SCREENING STATUS   . Health care maintenance MEDICAL EXAMINATIONS/REPORTS STATUS   . Passive smoke exposure PASSIVE SMOKER   . BMI 32.0-32.9,adult BODY MASS INDEX 30+ - OBESITY   . Impaired fasting glucose IMPAIRED FASTING GLYCAEMIA     Past Surgical History   Procedure Laterality Date   . Tubal ligation       Tubal ligation 2008     Medications and medications allergies reviewed.   Outpatient Prescriptions Marked as Taking for the 07/08/15 encounter (Nutrition) with Jovita Kussmaul, RD   Medication Sig Dispense Refill   . ciprofloxacin HCl (CIPRO) 500 MG tablet Take 1 tablet (500 mg total) by mouth 2 (two) times daily. 14 tablet 0   . levothyroxine (LEVOXYL) 150 MCG tablet Take 1 tablet (150 mcg total) by mouth daily. Take 150 mcg by mouth Daily. 90 tablet 0   . liothyronine (CYTOMEL) 5 MCG tablet Take 1 tablet (5 mcg total) by mouth daily. 30 tablet 11       Social  History   Substance Use Topics   . Smoking status: Passive Smoke Exposure - Never Smoker   . Smokeless tobacco: Not on file      Comment: parents smoked   . Alcohol Use: 0.0 oz/week     0 Standard drinks or equivalent per week      Comment: occasional      Social History     Social History Narrative     Anthropometric Measurements  Ht 157.5 cm (62)  Wt 81.375 kg (179 lb 6.4 oz)  BMI 32.80 kg/m2 Ht with 1 in heel for accurate BMI.  Patient's Target Weight: not discussed today               IBW: 110# +/- 10%    Weight Change: pt lost 40.0# four years ago, gained 40# in one year, then gained an additional 10#. Currently maintaining this current weight range for the past 6 months      Ht/wt status:obese class 1    Biochemical Data, Medical Tests and Procedures  Lab Results   Component Value Date/Time    SODIUM 138 05/18/2015 07:13 AM    POTASSIUM 4.0 05/18/2015 07:13 AM    CHLORIDE 106 05/18/2015 07:13 AM    CO2 - CARBON DIOXIDE 25.5  05/18/2015 07:13 AM    GLUCOSE 100* 05/18/2015 07:13 AM    BUN 12 05/18/2015 07:13 AM    RBC 4.54 05/18/2015 07:13 AM    HCT 39.3 05/18/2015 07:13 AM    HGB 13.3 05/18/2015 07:13 AM    RDW 13.4 05/18/2015 07:13 AM   Elevated glucose     Food and Nutrition History  Pt report of diabetic meal planning: no plan  Food & Beverage Intake (Recall):   B (9am): oatmeal, almond milk, PB, flax, banana, vanilla extract   L (1pm): meat, leftover grains like noodles   D (6pm): smoothie with 1/2 c juice, strawberries     Fluids: water, coffee   Food Allergies: no known food allergies   Medications that may impact nutritional status: cytomel    Food and Nutrition Knowledge: Discussed well-balanced meal plan. Reviewed food groups. Snack and meal ideas discussed. Pt didn't have a set structure with eating breakfast prior to 2-3 weeks ago. She would eat whatever is available at work around 9-10am, like crackers or oatmeal.  Talked about the importance of feeding herself at regular intervals to support  energy throughout the day. Pt agreed. Discussed alternative food options for dinner, explained that drinking a fruit smoothie (made of only fruit and juice) will lead to a higher spike in blood sugars and it will be helpful to even out the meals by incorporating protein and fat either into the smoothie or have a small smoothie to accompany a meal.  Pt stated she will try to have a smoothie sometimes in the morning, but add peanut butter, flax and milk for more balance.      Beliefs and Attitude: Understanding:good Motivation:good   Stage of Change Diet: Preparation Exercise: Contemplation    Nutrition-Focused Physical Findings  Pt appears well-nourished and well-hydrated.  Nutrition Focused Physical Exam: deferred    Nutrition Diagnosis (Problem, Etiology, Signs and Symptoms)  Excessive carbohydrate intake related to food and nutrition-related knowledge deficit, as evidenced by pt dietary recall.    Nutrition Intervention  Nutrition Prescription  Regular meal pattern      Intervention and Goals  Motivational Interviewing with goal(s) of improved blood sugar control and improved health.    Monitoring and Evaluation  Structured eating patterns   A1c <5.7%    Next Appointment: Pt may benefit from another nutrition session in 6 months to check in with nutrition progress.  Sent referral to MD for one more additional appointment.     Jovita Kussmaul, MS, RD  07/08/2015 5:08 PM   [facility]    Note forwarded to: [facility], *

## 2015-10-25 NOTE — Telephone Encounter (Signed)
If this is from a food that you are eating,  keep a food log book / diary and try to figure out what it is that is causing the rash.  If you document what you are eating, often it is easier to figure out what is causing the rash.    Also environmental allergies can also be an issue as well.  Dogs, cats, perfumes in public spaces and so on.      An Antihistamine such as CLARITIN or ZYRTEC (OTC) at bedtime may be very helpful.      Yes we can refer to an Allergist,  I would recommend Dr Remer Macho in [place].  Yes we can send over a referral for an allergy consultation with Dr Kae Heller.      Thanks so much

## 2015-10-25 NOTE — Telephone Encounter (Signed)
Dr. Jorge Ny please advise on referral to allergist.

## 2015-10-25 NOTE — Telephone Encounter (Signed)
From: [name]  To: [name], MD  Sent: 10/25/2015 8:42 AM PST  Subject: Non-Urgent Medical Question    Dr Jorge Ny,    Im getting that rash again and was wondering if this could be do to a food allgery and was wondering if i could to an allgergist. if i need an appointment to talk I can do that too.

## 2015-11-21 NOTE — Telephone Encounter (Signed)
From: [name]  To: [name], MD  Sent: 11/21/2015 12:35 PM PST  Subject: Non-Urgent Medical Question    Dr. Jorge Ny,    Im just double checking on labs. i think you wanted me to get some in six months again for my thyroid and just wanted to make sure that was correct so i can get some at the end of the month. 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 I have read in numerous articles and books that I should be getting my adrenals checked too. I would like to discuss this with you and get your thoughts or if your ok with it getting the labs done that i need. I have been working out for the last 4 months and still not losing weight and with a good diet. If my labs are still good just trying to figure out which way to go.     thank you,    Kerry Allen.

## 2015-11-27 NOTE — Telephone Encounter (Signed)
Thanks so much for your message.    Taking about Hashimoto's, also known as chronic lymphocytic thyroiditis, is considered an autoimmune disease.      I actually have labs pending for you and are available at your convenience.      I have a Thyroid Peroxidase Antibody already scheduled for you, which helps [place] with the diagnosis of Hashimoto's.  I have your other labs including a TSH, Free T4, Free T3 and a T3 reverse.    In addition we can add anti-thyroglobulin antibody test.  This is also consider a Anti-thyroid antibodies (ATA) test, and helpful in the diagnosis of Hashimoto's.  Please go ahead and order the anti-thyroglobulin antibody test.    All other labs have also been ordered and it would be great to come in fasting for these labs.      Thanks so much and thanks so much for letting the patient know

## 2015-12-08 DIAGNOSIS — N978 Female infertility of other origin: Secondary | ICD-10-CM | POA: Insufficient documentation

## 2016-01-20 ENCOUNTER — Other Ambulatory Visit: Admit: 2016-01-20 | Discharge: 2016-01-20 | Payer: PRIVATE HEALTH INSURANCE | Primary: Family

## 2016-01-20 DIAGNOSIS — E063 Autoimmune thyroiditis: Secondary | ICD-10-CM

## 2016-01-20 LAB — COMPREHENSIVE METABOLIC PANEL
ALT - Alanine Aminotransferase: 23 IU/L (ref 7–52)
AST - Aspartate Aminotransferase: 14 IU/L (ref 8–39)
Albumin/Globulin Ratio: 1.4 (ref >0.9)
Albumin: 3.8 g/dL (ref 3.5–5.0)
Alkaline Phosphatase: 71 IU/L (ref 34–104)
Anion Gap: 5.7 mmol/L (ref 3.0–11.0)
BUN: 12 mg/dL (ref 6–23)
Bilirubin Total: 0.4 mg/dL (ref 0.3–1.2)
CO2 - Carbon Dioxide: 27.3 mmol/L (ref 21.0–31.0)
Calcium: 9.1 mg/dL (ref 8.6–10.3)
Chloride: 104 mmol/L (ref 98–111)
Creatinine: 0.73 mg/dL (ref 0.55–1.10)
GFR Estimate: >60 mL/min/{1.73_m2} (ref >=60)
Globulin: 2.7 g/dL (ref 2.2–3.7)
Glucose: 95 mg/dL (ref 80–99)
Potassium: 4.2 mmol/L (ref 3.5–5.1)
Protein Total: 6.5 g/dL (ref 6.0–8.0)
Sodium: 137 mmol/L (ref 135–143)

## 2016-01-20 LAB — CBC WITH AUTO DIFFERENTIAL
Basophils %: 1 % (ref 0–2)
Basophils, Absolute: 0.1 10*3/ÂµL (ref 0.0–0.2)
Eosinophils %: 4 % (ref 0–7)
Eosinophils, Absolute: 0.3 10*3/ÂµL (ref 0.0–0.7)
HCT: 39.5 % (ref 37.0–48.0)
Hemoglobin: 12.8 g/dL (ref 12.0–16.0)
Lymphocytes %: 21 % — ABNORMAL LOW (ref 25–45)
Lymphocytes, Absolute: 1.9 10*3/ÂµL (ref 1.1–4.3)
MCH: 27.2 pg (ref 27.0–34.0)
MCHC: 32.3 g/dL (ref 32.0–36.0)
MCV: 84.4 fL (ref 81.0–99.0)
MPV: 9.1 fL (ref 7.4–10.4)
Monocytes %: 6 % (ref 0–12)
Monocytes, Absolute: 0.5 10*3/ÂµL (ref 0.0–1.2)
Neutrophils %: 69 % (ref 35–70)
Neutrophils, Absolute: 6.2 10*3/ÂµL (ref 1.6–7.3)
Platelet Count: 337 10*3/ÂµL (ref 150–400)
RBC: 4.69 10*6/ÂµL (ref 4.20–5.40)
RDW: 13.2 % (ref 11.5–14.5)
WBC: 9 10*3/ÂµL (ref 4.8–10.8)

## 2016-01-20 LAB — T4, FREE: T4, Free: 1.2 ng/dL (ref 0.6–1.2)

## 2016-01-20 LAB — TSH: TSH - Thyroid Stimulating Hormone: 0.03 u[IU]/mL — ABNORMAL LOW (ref 0.34–5.60)

## 2016-01-20 LAB — T3, REVERSE: T3, Reverse: 23 ng/dL (ref 9.0–27.0)

## 2016-01-20 LAB — CORONARY RISK LIPID PANEL
Cholesterol, HDL: 43 mg/dL (ref >=40)
Cholesterol: 157 mg/dL (ref <200)
LDL Calculated: 98 mg/dL (ref <100)
Triglyceride: 79 mg/dL (ref 30–149)

## 2016-01-20 LAB — THYROID PEROXIDASE ANTIBODY: Thyroid Peroxidase Antibody: 3.3 IU/mL (ref <9.0)

## 2016-01-20 LAB — GLYCO-HEMOGLOBIN A1C
Estimated Average Glucose: 111 mg/dL
Glycohemoglobin (A1c): 5.5 % (ref 4.3–6.1)

## 2016-01-20 LAB — T3, FREE: T3, Free: 3.1 pg/mL (ref 2.5–3.9)

## 2016-02-10 MED ORDER — levothyroxine (SYNTHROID, LEVOTHROID) 150 MCG tablet
150 | ORAL_TABLET | ORAL | 1 refills | Status: DC
Start: 2016-02-10 — End: 2016-10-05

## 2016-02-21 MED ORDER — liothyronine (CYTOMEL) 5 MCG tablet
5 | ORAL_TABLET | Freq: Every day | ORAL | 11 refills | Status: DC
Start: 2016-02-21 — End: 2017-01-25

## 2016-06-25 ENCOUNTER — Ambulatory Visit (HOSPITAL_COMMUNITY): Payer: 59

## 2016-06-25 ENCOUNTER — Other Ambulatory Visit (HOSPITAL_COMMUNITY): Payer: Self-pay | Admitting: Certified Nurse Midwife

## 2016-06-25 DIAGNOSIS — O26851 Spotting complicating pregnancy, first trimester: Secondary | ICD-10-CM

## 2016-06-26 ENCOUNTER — Encounter (HOSPITAL_COMMUNITY): Payer: Self-pay | Admitting: *Deleted

## 2016-06-26 ENCOUNTER — Inpatient Hospital Stay (HOSPITAL_COMMUNITY)
Admission: AD | Admit: 2016-06-26 | Discharge: 2016-06-26 | Disposition: A | Discharge status: Home / Self Care | Payer: 59 | Source: Ambulatory Visit | Attending: Obstetrics and Gynecology | Admitting: Obstetrics and Gynecology

## 2016-06-26 ENCOUNTER — Inpatient Hospital Stay (HOSPITAL_COMMUNITY): Payer: 59

## 2016-06-26 DIAGNOSIS — R55 Syncope and collapse: Secondary | ICD-10-CM | POA: Insufficient documentation

## 2016-06-26 DIAGNOSIS — O039 Complete or unspecified spontaneous abortion without complication: Secondary | ICD-10-CM | POA: Insufficient documentation

## 2016-06-26 LAB — CBC
HCT: 31.8 % — ABNORMAL LOW (ref 36.0–46.0)
Hemoglobin: 10.9 g/dL — ABNORMAL LOW (ref 12.0–15.0)
MCH: 31.2 pg (ref 26.0–34.0)
MCHC: 34.3 g/dL (ref 30.0–36.0)
MCV: 91.1 fL (ref 78.0–100.0)
Platelets: 264 10*3/uL (ref 150–400)
RBC: 3.49 MIL/uL — ABNORMAL LOW (ref 3.87–5.11)
RDW: 13.5 % (ref 11.5–15.5)
WBC: 12 10*3/uL — ABNORMAL HIGH (ref 4.0–10.5)

## 2016-06-26 LAB — HCG, QUANTITATIVE, PREGNANCY: hCG, Beta Chain, Quant, S: 3846 m[IU]/mL — ABNORMAL HIGH (ref <5)

## 2016-06-26 LAB — HCG, SERUM, QUALITATIVE: Preg, Serum: POSITIVE — AB

## 2016-06-26 MED ORDER — METHYLERGONOVINE MALEATE 0.2 MG PO TABS: 0.2000 mg | ORAL_TABLET | Freq: Three times a day (TID) | ORAL | 0 refills | Status: AC

## 2016-06-26 MED ORDER — LACTATED RINGERS IV BOLUS (SEPSIS): 500.0000 mL | Freq: Once | INTRAVENOUS | Status: DC

## 2016-06-26 MED ORDER — LACTATED RINGERS IV SOLN: INTRAVENOUS | Status: DC

## 2016-06-26 NOTE — Progress Notes (Signed)
[facility] client Dr Ronita Hipps attending physician Artelia Laroche CNM on-call  SAB in process - reports syncopal at home with brisk bleeding - passing clots Orders placed  Artelia Laroche CNM [facility]

## 2016-06-26 NOTE — MAU Provider Note (Signed)
History     CSN: [id number]  Arrival date and time: 06/26/16 1846     Chief Complaint  Patient presents with  . Vaginal Bleeding   HPI  Early pregnancy 9 weeks by LMP /  Past Medical History:  Diagnosis Date  . Anemia   . Hx of cholecystectomy   . Hx of gastric bypass   . Hypothyroid   . Magnesium deficiency   . Reflux   . Seasonal allergies   . Vitamin D deficiency     Past Surgical History:  Procedure Laterality Date  . cholestectomy    . FRACTURE SURGERY    . GASTRIC BYPASS    . TONSILLECTOMY      No family history on file.  Social History  Substance Use Topics  . Smoking status: Never Smoker  . Smokeless tobacco: Never Used  . Alcohol use No    Allergies:  Allergies  Allergen Reactions  . Erythromycin Nausea And Vomiting  . Penicillins Rash    Has patient had a PCN reaction causing immediate rash, facial/tongue/throat swelling, SOB or lightheadedness with hypotension: Yes Has patient had a PCN reaction causing severe rash involving mucus membranes or skin necrosis: No Has patient had a PCN reaction that required hospitalization No Has patient had a PCN reaction occurring within the last 10 years: No If all of the above answers are "NO", then may proceed with Cephalosporin use.     Prescriptions Prior to Admission  Medication Sig Dispense Refill Last Dose  . acetaminophen (TYLENOL) 500 MG tablet Take 1,000 mg by mouth every 6 (six) hours as needed for mild pain.   06/26/2016 at Unknown time  . Ascorbic Acid (VITAMIN C PO) Take 1 tablet by mouth daily.   06/25/2016 at Unknown time  . CALCIUM PO Take 1 tablet by mouth.   06/26/2016 at Unknown time  . Cholecalciferol (VITAMIN D3) 2000 UNITS TABS Take 1 tablet by mouth daily.   06/26/2016 at Unknown time  . ibuprofen (ADVIL,MOTRIN) 600 MG tablet Take 600 mg by mouth every 6 (six) hours as needed for moderate pain.   06/26/2016 at Unknown time  . IRON PO Take 1 tablet by mouth daily.   06/26/2016 at Unknown  time  . levothyroxine (SYNTHROID, LEVOTHROID) 100 MCG tablet Take 100 mcg by mouth daily before breakfast.   06/26/2016 at Unknown time  . loratadine (CLARITIN) 10 MG tablet Take 10 mg by mouth daily.   06/25/2016 at Unknown time  . magnesium oxide (MAG-OX) 400 MG tablet Take 400 mg by mouth daily.   06/26/2016 at Unknown time  . metFORMIN (GLUCOPHAGE) 850 MG tablet Take 850 mg by mouth 3 (three) times daily.   06/26/2016 at Unknown time  . omega-3 acid ethyl esters (LOVAZA) 1 G capsule Take 1 g by mouth daily.   06/26/2016 at Unknown time  . Prenatal Vit-Fe Fumarate-FA (PRENATAL MULTIVITAMIN) TABS tablet Take 1 tablet by mouth daily at 12 noon.   06/26/2016 at Unknown time  . sertraline (ZOLOFT) 100 MG tablet Take 100 mg by mouth daily.   06/26/2016 at Unknown time    ROS Physical Exam   Blood pressure 124/58, pulse 86, temperature 98.6 F (37 C), temperature source Oral, resp. rate 18, height 5\' 2"  (1.575 m), weight 77.2 kg (170 lb 4 oz), unknown if currently breastfeeding.     Results for Margaret Miller, Margaret Miller (MRN [id number]) as of 06/26/2016 22:36  Ref. Range 06/26/2016 19:18  WBC Latest Ref Range: 4.0 - 10.5 K/uL 12.0 (  H)  RBC Latest Ref Range: 3.87 - 5.11 MIL/uL 3.49 (L)  Hemoglobin Latest Ref Range: 12.0 - 15.0 g/dL 10.9 (L)  HCT Latest Ref Range: 36.0 - 46.0 % 31.8 (L)  MCV Latest Ref Range: 78.0 - 100.0 fL 91.1  MCH Latest Ref Range: 26.0 - 34.0 pg 31.2  MCHC Latest Ref Range: 30.0 - 36.0 g/dL 34.3  RDW Latest Ref Range: 11.5 - 15.5 % 13.5  Platelets Latest Ref Range: 150 - 400 K/uL 264   Physical Exam Alert and oriented / NAD or pain Heart RRR Lungs clear / non-labored Abdomen soft and non-tender SSE: moderate blood in vaginal vault / cervix visualized dilated - scant bleeding / no tissue seen          Uterus without enlargement / cervix dilated ~2cm          Adnexa without masses or tenderness MAU Course  Procedures CLINICAL DATA:  Pregnant, heavy bleeding,  cramping  EXAM: OBSTETRIC <14 WK [place] AND TRANSVAGINAL OB US  TECHNIQUE: Both transabdominal and transvaginal ultrasound examinations were performed for complete evaluation of the gestation as well as the maternal uterus, adnexal regions, and pelvic cul-de-sac. Transvaginal technique was performed to assess early pregnancy.  COMPARISON:  None.  FINDINGS: Intrauterine gestational sac: None  Maternal uterus/adnexae: Endometrium is enlarged/thickened/ heterogeneous, measuring 3.0 cm, without vascularity. This appearance suggests blood products.  Right ovary is within normal limits, measuring 3.3 x 2.1 x 2.1 cm.  Left ovary is within normal limits, measuring 2.7 x 1.4 x 1.7 cm.  No free fluid.  IMPRESSION: No IUP is visualized. Thickened/heterogeneous endometrium without vascularity, measuring 3.0 cm, favoring blood products.  Assessment and Plan  SAB at 9 weeks - appears complete  Margaret Miller 06/26/2016, 10:25 PM

## 2016-06-26 NOTE — MAU Note (Signed)
PT  SAYS  VAG BLEEDING - LIGHT   - STARTED  8-11,  THEN  HEAVY ON 8-19.     THEN  MON 8-21-     BLEEDING   HEAVY - PASSED LG  CLOT -   CRAMPING.     THOUGHT   STOPPED.  THEN AT  2 PM TODAY-  CRAMPING  AND  BLEEDING  WORSE-     PAD ON IN TRIAGE-   SMALL AMT    BLEEDING.   .  SAYS SHE  PASSED  OUT  AT    66PM.-    DID NOT  HIT  HEAD.

## 2016-06-26 NOTE — Discharge Instructions (Signed)
Miscarriage  A miscarriage is the loss of a pregnancy before 12 weeks. The cause is often unknown. HOME CARE  You should rest with only light activity.  Have help at home.  Do not use tampons.   Do not  have sex (intercourse) until your doctor approves.  Only take medicine as told by your doctor.  Do not take aspirin.  Keep all doctor visits as told.  If you or your partner have problems with grieving, talk to your doctor. You can also try counseling. Give yourself time to grieve before trying to get pregnant again. GET HELP RIGHT AWAY IF:  You have bad cramps or pain in your back or belly (abdomen).  You have a fever.  You pass large clumps of blood (clots) from your vagina that are lemon size or larger.   You pass large amounts of tissue from your vagina.   You have very heavy bleeding.  You have thick, bad-smelling fluid (discharge) coming from the vagina.  You get lightheaded, weak, or you pass out (faint).  You have chills. MAKE SURE YOU:  Understand these instructions.  Will watch your condition.  Will get help right away if you are not doing well or get worse.   This information is not intended to replace advice given to you by your health care provider. Make sure you discuss any questions you have with your health care provider.   Document Released: 01/14/2012 Document Reviewed: 01/14/2012 Elsevier Interactive Patient Education [employer].

## 2016-08-07 ENCOUNTER — Encounter: Payer: PRIVATE HEALTH INSURANCE | Attending: MD | Primary: Family

## 2016-09-03 LAB — URINE CULTURE: Culture Result: >100000 — AB

## 2016-09-03 NOTE — Telephone Encounter (Signed)
Regarding: UTI symptoms   ----- Message from Damian Leavell sent at 09/03/2016  9:02 AM PDT -----  Kerry Allen states she  is experiencing UTI symptoms     Patient location: at work    Patient call back number is: [phone] (home)     Are you experiencing symptom NOW?  Yes    How long has patient had symptoms: 3 weeks.    Can a detailed message be left? Yes    Has Privacy Policy been signed?  Yes    If no the following statement was read to patient: We are required by federal law to provide you with our notice of privacy practices and that we ask you to acknowledge receipt of this notice. You will receive a copy in the mail and we request that you review, sign, and return your acknowledgment to your primary care clinic.    PSR action:  Patient informed that nurse will call patient back.  A nurse will call you back today as soon as possible.  Call priority:  Routed to Nurse Triage as High Priority.

## 2016-09-03 NOTE — Telephone Encounter (Signed)
Verified Location and phone number of Patient: Yes. Pt located at [address], [address],     What is the caller's presenting complaint? UTI symptoms x 3 weeks    Location and description of symptoms? Pt c/o UTI symptoms (lower back pain and pressure in pelvic area x 3 weeks. The pelvic pain is a 1-2/10 and the back pain is a 4-5/10. No urinary burning or frequency. Pt also has a headache right now. See protocol checklist.    Describe any associated symptoms? See above    Describe any precipitating factors? Unknown to pt    Have you recently been seen by your provider? No    Pertinent medical history and medications? None related to symptoms    PLAN: see below    DISPO: Go to UC now. No appts available in office today. Pt refuses disposition. I informed the patient that they have the right to refuse the advice from this nurse, but informed the patient without medical attention, they may have an emergent or life threatening medical condition that if goes unrecognized, they may not be treated in a timely manner. Pt states understanding of this statement.   Pt requests next available appt. Appt scheduled for 1500 Wednesday with [facility] FNP (pt informed).    Reason for Disposition  . Pain or burning with urination     UTI symptoms x 3 weeks    Protocols used: BACK PAIN-A-[place]

## 2016-09-05 ENCOUNTER — Encounter: Payer: BC Managed Care – PPO | Attending: Family | Primary: Family

## 2016-09-05 NOTE — Progress Notes (Unsigned)
Kerry Allen is a 40 y.o. female.  No chief complaint on file.    No outpatient prescriptions have been marked as taking for the 09/05/16 encounter (Appointment) with Nolberto Hanlon, FNP-C.       HISTORY OF PRESENT ILLNESS  HPI    Patient is a 40 y.o. female presenting today to for     Has concerns for a possible UTI; symptoms started *** .     Associated symptoms:  Suprapubic pain: {YES NO:21966}  Dysuria or burning with urination? {YES [id number]  Frequency: {YES NO:21966} Urgency: {YES NO:21966}  Blood in urine? {YES NO:21966}  Flank Pain? {YES NO:21966}  Fever/Chills? {YES [id number]  Incontinence (new onset)? {YES [id number]  Possibly Pregnant? {YES [id number]    Has tried *** for symptoms which has provided *** relief.     No problems updated.  Allergies   Allergen Reactions   . Penicillins Other (See Comments)     Past Medical History:   Diagnosis Date   . Hashimoto's thyroiditis     Hashimoto's thyroiditis with hyperthyroidism   . Hypothyroidism     Hypothyroidism-Dr. Leonia Reader   . Vitamin D deficiency      Past Surgical History:   Procedure Laterality Date   . TUBAL LIGATION      Tubal ligation 2008     No family history on file.  History   Smoking Status   . Passive Smoke Exposure - Never Smoker   Smokeless Tobacco   . Not on file     Comment: parents smoked       REVIEW OF SYSTEM  Review of Systems  See HPI    PHYSICAL EXAM  There were no vitals taken for this visit.  There is no height or weight on file to calculate BMI.  No exam data present    Physical Exam    ASSESSMENT & PLAN    ***    No Follow-up on file.    There are no Patient Instructions on file for this visit.    This note was transcribed using speech recognition software. Please contact us for clarification if any questions arise relating to the wording of this document.

## 2016-10-05 MED ORDER — levothyroxine (SYNTHROID, LEVOTHROID) 150 MCG tablet
150 | ORAL_TABLET | ORAL | 0 refills | Status: DC
Start: 2016-10-05 — End: 2017-01-18

## 2016-10-09 NOTE — Telephone Encounter (Signed)
Labs were done in March 2017.  The only labs that I would like are her thyroid labs.  I would like the same labs that I ordered for thyroid in March 2017.  My question is her  Hashimoto's thyroiditis.  Is she currently seeing endocrine at this time?  She was seeing Dr Leonia Reader, but I believe that he retired?  Please let me know.  I  Thanks so much

## 2016-10-09 NOTE — Telephone Encounter (Signed)
Would you like same labs as March?

## 2016-10-09 NOTE — Telephone Encounter (Signed)
From: [name]  To: [name], MD  Sent: 10/09/2016 7:36 AM PST  Subject: Non-Urgent Medical Question    I'm just wondering if Dr. Jorge Ny is wanting any labs done before my visit?     Thanks

## 2016-10-11 NOTE — Telephone Encounter (Signed)
Lets order the THYROID labs, same that I ordered in March 2017.  Let's have these drawn prior to her next appointment.  We will discuss all issues at the next visit  Thanks so much

## 2016-10-11 NOTE — Assessment & Plan Note (Signed)
I am not seeing any endocrine at this time but would like to talk to Dr about that and who would she suggest for a referral to them.  ----- Message -----  From: Evelina Bucy  Sent: 10/09/2016??3:27 PM PST  To: [name]  Subject: RE: Non-Urgent Medical Question  Hi Kerry Allen,   Dr. Jorge Ny would like the same labs that she ordered in March for your Thyroid level.??I will put this order in for you to have drawn.   She would like to know because of your Hashimoto's thyroiditis.??Are you currently seeing endocrine at this time???You had been seeing Dr Leonia Reader, but I believe that he retired.   Please let us know.     Thanks so much.   Jibran Crookshanks      ----- Message -----  ?? From: Kerry Allen  ?? Sent: 10/09/2016??7:36 AM PST  ???? To: Kerry Payor, MD  Subject: Non-Urgent Medical Question    I'm just wondering if Dr. Jorge Ny is wanting any labs done before my visit?     Thanks      Labs were done in March 2017.  The only labs that I would like are her thyroid labs.  I would like the same labs that I ordered for thyroid in March 2017.  My question is her  Hashimoto's thyroiditis.  Is she currently seeing endocrine at this time?  She was seeing Dr Leonia Reader, but I believe that he retired? Please let me know.    Thanks so much

## 2016-10-11 NOTE — Telephone Encounter (Signed)
Dr. Jorge Ny- see patient response and advise.  I added thyroid labs.

## 2016-10-11 NOTE — Telephone Encounter (Signed)
Labs ordered.

## 2016-10-12 NOTE — Telephone Encounter (Signed)
Thanks so much.  If this has been completed, please close

## 2016-10-16 ENCOUNTER — Ambulatory Visit (HOSPITAL_BASED_OUTPATIENT_CLINIC_OR_DEPARTMENT_OTHER): Payer: 59

## 2016-10-16 ENCOUNTER — Telehealth: Payer: Self-pay | Admitting: Hematology and Oncology

## 2016-10-16 ENCOUNTER — Encounter: Payer: Self-pay | Admitting: Hematology and Oncology

## 2016-10-16 ENCOUNTER — Ambulatory Visit (HOSPITAL_BASED_OUTPATIENT_CLINIC_OR_DEPARTMENT_OTHER): Payer: 59 | Admitting: Hematology and Oncology

## 2016-10-16 DIAGNOSIS — Z9884 Bariatric surgery status: Secondary | ICD-10-CM | POA: Diagnosis not present

## 2016-10-16 DIAGNOSIS — K9089 Other intestinal malabsorption: Secondary | ICD-10-CM | POA: Diagnosis not present

## 2016-10-16 DIAGNOSIS — D508 Other iron deficiency anemias: Secondary | ICD-10-CM | POA: Diagnosis not present

## 2016-10-16 DIAGNOSIS — D509 Iron deficiency anemia, unspecified: Secondary | ICD-10-CM | POA: Insufficient documentation

## 2016-10-16 LAB — CBC & DIFF AND RETIC
BASO%: 0.3 % (ref 0.0–2.0)
Basophils Absolute: 0 10*3/uL (ref 0.0–0.1)
EOS ABS: 0.1 10*3/uL (ref 0.0–0.5)
EOS%: 2 % (ref 0.0–7.0)
HCT: 38.2 % (ref 34.8–46.6)
HEMOGLOBIN: 12.3 g/dL (ref 11.6–15.9)
IMMATURE RETIC FRACT: 1.7 % (ref 1.60–10.00)
LYMPH%: 18 % (ref 14.0–49.7)
MCH: 25.7 pg (ref 25.1–34.0)
MCHC: 32.2 g/dL (ref 31.5–36.0)
MCV: 79.9 fL (ref 79.5–101.0)
MONO#: 0.5 10*3/uL (ref 0.1–0.9)
MONO%: 6.9 % (ref 0.0–14.0)
NEUT#: 4.7 10*3/uL (ref 1.5–6.5)
NEUT%: 72.8 % (ref 38.4–76.8)
PLATELETS: 332 10*3/uL (ref 145–400)
RBC: 4.78 10*6/uL (ref 3.70–5.45)
RDW: 18.1 % — ABNORMAL HIGH (ref 11.2–14.5)
Retic %: 0.89 % (ref 0.70–2.10)
Retic Ct Abs: 42.54 10*3/uL (ref 33.70–90.70)
WBC: 6.5 10*3/uL (ref 3.9–10.3)
lymph#: 1.2 10*3/uL (ref 0.9–3.3)

## 2016-10-16 LAB — FERRITIN: Ferritin: 7 ng/ml — ABNORMAL LOW (ref 9–269)

## 2016-10-16 NOTE — Progress Notes (Signed)
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 female is here because of severe iron deficiency anemia  She was found to have abnormal CBC from routine blood work monitoring She has recent 1st trimester miscarriage in August 2017. She has noted to be anemic. Recent blood work also showed severe iron deficiency with ferritin of 5 She denies recent chest pain on exertion, but complained of shortness of breath on minimal exertion, pre-syncopal episodes, and palpitations. She had not noticed any recent bleeding such as epistaxis, hematuria or hematochezia The patient denies over the counter NSAID ingestion. She is not on antiplatelets agents. 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 Her age appropriate screening programs are up-to-date. She has significant pica, chewing on ice and craving "rocks"; she eats a variety of diet. She never donated blood or received blood transfusion The patient was prescribed oral iron supplements and she takes iron supplement 3 times a day but is still iron deficient  MEDICAL HISTORY:  Past Medical History:  Diagnosis Date  . Anemia   . Hx of cholecystectomy   . Hx of gastric bypass   . Hypothyroid   . Magnesium deficiency   . Reflux   . Seasonal allergies   . Vitamin D deficiency     SURGICAL HISTORY: Past Surgical History:  Procedure Laterality Date  . cholestectomy    . FRACTURE SURGERY    . GASTRIC BYPASS    . TONSILLECTOMY      SOCIAL HISTORY: Social History   Social History  . Marital status: Married    Spouse name: N/A  . Number of children: N/A  . Years of education: N/A   Occupational History   . Not on file.   Social History Main Topics  . Smoking status: Never Smoker  . Smokeless tobacco: Never Used  . Alcohol use No  . Drug use: No  . Sexual activity: Yes   Other Topics Concern  . Not on file   Social History Narrative   ** Merged History Encounter **        FAMILY HISTORY: History reviewed. No pertinent family history.  ALLERGIES:  is allergic to erythromycin and penicillins.  MEDICATIONS:  Current Outpatient Prescriptions  Medication Sig Dispense Refill  . acetaminophen (TYLENOL) 500 MG tablet Take 1,000 mg by mouth every 6 (six) hours as needed for mild pain.    . Ascorbic Acid (VITAMIN C PO) Take 1 tablet by mouth daily.    Marland Kitchen CALCIUM PO Take 1 tablet by mouth.    . Cholecalciferol (VITAMIN D3) 2000 UNITS TABS Take 1 tablet by mouth daily.    Marland Kitchen ibuprofen (ADVIL,MOTRIN) 600 MG tablet Take 600 mg by mouth every 6 (six) hours as needed for moderate pain.    . IRON PO Take 1 tablet by mouth daily.    Marland Kitchen levothyroxine (SYNTHROID, LEVOTHROID) 100 MCG tablet Take 100 mcg by mouth daily before breakfast.    . loratadine (CLARITIN) 10 MG tablet Take 10 mg by mouth daily.    . magnesium oxide (MAG-OX) 400 MG tablet Take 400 mg by mouth daily.    . metFORMIN (GLUCOPHAGE) 850 MG tablet Take 850 mg by mouth  3 (three) times daily.    Marland Kitchen omega-3 acid ethyl esters (LOVAZA) 1 G capsule Take 1 g by mouth daily.    . Prenatal Vit-Fe Fumarate-FA (PRENATAL MULTIVITAMIN) TABS tablet Take 1 tablet by mouth daily at 12 noon.    . sertraline (ZOLOFT) 100 MG tablet Take 100 mg by mouth daily.     No current facility-administered medications for this visit.     REVIEW OF SYSTEMS:   Constitutional: Denies fevers, chills or abnormal night sweats Eyes: Denies blurriness of vision, double vision or watery eyes Ears, nose, mouth, throat, and face: Denies mucositis or sore throat Gastrointestinal:  Denies nausea, heartburn or change in bowel habits Skin: Denies abnormal skin  rashes Lymphatics: Denies new lymphadenopathy or easy bruising Neurological:Denies numbness, tingling or new weaknesses Behavioral/Psych: Mood is stable, no new changes  All other systems were reviewed with the patient and are negative.  PHYSICAL EXAMINATION: ECOG PERFORMANCE STATUS: 1 - Symptomatic but completely ambulatory  Vitals:   10/16/16 1359  BP: 125/64  Pulse: 89  Resp: 18  Temp: 98.3 F (36.8 C)   Filed Weights   10/16/16 1359  Weight: 169 lb 12.8 oz (77 kg)    GENERAL:alert, no distress and comfortable SKIN: skin color, texture, turgor are normal, no rashes or significant lesions EYES: normal, conjunctiva are pink and non-injected, sclera clear OROPHARYNX:no exudate, no erythema and lips, buccal mucosa, and tongue normal  NECK: supple, thyroid normal size, non-tender, without nodularity LYMPH:  no palpable lymphadenopathy in the cervical, axillary or inguinal LUNGS: clear to auscultation and percussion with normal breathing effort HEART: regular rate & rhythm and no murmurs and no lower extremity edema ABDOMEN:abdomen soft, non-tender and normal bowel sounds Musculoskeletal:no cyanosis of digits and no clubbing  PSYCH: alert & oriented x 3 with fluent speech NEURO: no focal motor/sensory deficits  ASSESSMENT & PLAN:  IDA (iron deficiency anemia) The most likely cause of her anemia is due to chronic blood loss/malabsorption syndrome. We discussed some of the risks, benefits, and alternatives of intravenous iron infusions. The patient is symptomatic from anemia and the iron level is critically low. She tolerated oral iron supplement poorly and desires to achieved higher levels of iron faster for adequate hematopoesis. Some of the side-effects to be expected including risks of infusion reactions, phlebitis, headaches, nausea and fatigue.  The patient is willing to proceed. Patient education material was dispensed.  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 CBC & Diff and Retic    Standing Status:   Future    Number of Occurrences:   1    Standing Expiration Date:   11/20/2017  . Ferritin    Standing Status:   Future  Number of Occurrences:   1    Standing Expiration Date:   11/20/2017     All questions were answered. The patient knows to call the clinic with any problems, questions or concerns. I spent 40 minutes counseling the patient face to face. The total time spent in the appointment was 40 minutes and more than 50% was on counseling.     Heath Lark, MD 10/16/16 4:34 PM

## 2016-10-16 NOTE — Telephone Encounter (Signed)
Appointments scheduled per 12/12 LOS. Patient given AVS report and calendars with future scheduled appointments.  °

## 2016-10-16 NOTE — Assessment & Plan Note (Addendum)
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 We discussed some of the risks, benefits, and alternatives of intravenous iron infusions. The patient is symptomatic from anemia and the iron level is critically low. She tolerated oral iron supplement poorly and desires to achieved higher levels of iron faster for adequate hematopoesis. Some of the side-effects to be expected including risks of infusion reactions, phlebitis, headaches, nausea and fatigue.  The patient is willing to proceed. Patient education material was dispensed.  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

## 2016-10-16 NOTE — Assessment & Plan Note (Signed)
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 

## 2016-10-17 ENCOUNTER — Telehealth: Payer: Self-pay | Admitting: *Deleted

## 2016-10-17 NOTE — Telephone Encounter (Signed)
Per LOS I have scheduled appts and notified the scheduler 

## 2016-10-19 ENCOUNTER — Telehealth: Payer: Self-pay | Admitting: *Deleted

## 2016-10-19 NOTE — Telephone Encounter (Signed)
Pt called for her lab results from earlier this week.  Informed her of Hbg normal but Ferritin is low at 7.  Pt wishes to proceed with IV Feraheme as scheduled next week and the following week.   She is aware that sometimes Insurance may not cover it since her Hgb is normal.  Pt reports feeling tired and willing to take the risk of having to pay for it herself if denied by insurance.

## 2016-10-24 ENCOUNTER — Ambulatory Visit (HOSPITAL_BASED_OUTPATIENT_CLINIC_OR_DEPARTMENT_OTHER): Payer: 59

## 2016-10-24 VITALS — BP 125/66 | HR 83 | Resp 16

## 2016-10-24 DIAGNOSIS — D508 Other iron deficiency anemias: Secondary | ICD-10-CM | POA: Diagnosis not present

## 2016-10-24 MED ORDER — HEPARIN SOD (PORK) LOCK FLUSH 100 UNIT/ML IV SOLN
250.0000 [IU] | Freq: Once | INTRAVENOUS | Status: DC | PRN
  Filled 2016-10-24: qty 5

## 2016-10-24 MED ORDER — SODIUM CHLORIDE 0.9% FLUSH
3.0000 mL | Freq: Once | INTRAVENOUS | Status: DC | PRN
  Filled 2016-10-24: qty 10

## 2016-10-24 MED ORDER — SODIUM CHLORIDE 0.9% FLUSH
10.0000 mL | INTRAVENOUS | Status: DC | PRN
  Filled 2016-10-24: qty 10

## 2016-10-24 MED ORDER — SODIUM CHLORIDE 0.9 % IV SOLN
Freq: Once | INTRAVENOUS | Status: AC
  Administered 2016-10-24: 10:00:00 via INTRAVENOUS

## 2016-10-24 MED ORDER — ALTEPLASE 2 MG IJ SOLR
2.0000 mg | Freq: Once | INTRAMUSCULAR | Status: DC | PRN
  Filled 2016-10-24: qty 2

## 2016-10-24 MED ORDER — HEPARIN SOD (PORK) LOCK FLUSH 100 UNIT/ML IV SOLN
500.0000 [IU] | Freq: Once | INTRAVENOUS | Status: DC | PRN
  Filled 2016-10-24: qty 5

## 2016-10-24 MED ORDER — FERUMOXYTOL INJECTION 510 MG/17 ML
510.0000 mg | Freq: Once | INTRAVENOUS | Status: AC
  Administered 2016-10-24: 510 mg via INTRAVENOUS
  Filled 2016-10-24: qty 17

## 2016-10-24 NOTE — Patient Instructions (Signed)
Ferumoxytol injection What is this medicine? FERUMOXYTOL is an iron complex. Iron is used to make healthy red blood cells, which carry oxygen and nutrients throughout the body. This medicine is used to treat iron deficiency anemia in people with chronic kidney disease. COMMON BRAND NAME(S): Feraheme What should I tell my health care provider before I take this medicine? They need to know if you have any of these conditions: -anemia not caused by low iron levels -high levels of iron in the blood -magnetic resonance imaging (MRI) test scheduled -an unusual or allergic reaction to iron, other medicines, foods, dyes, or preservatives -pregnant or trying to get pregnant -breast-feeding How should I use this medicine? This medicine is for injection into a vein. It is given by a health care professional in a hospital or clinic setting. Talk to your pediatrician regarding the use of this medicine in children. Special care may be needed. What if I miss a dose? It is important not to miss your dose. Call your doctor or health care professional if you are unable to keep an appointment. What may interact with this medicine? This medicine may interact with the following medications: -other iron products What should I watch for while using this medicine? Visit your doctor or healthcare professional regularly. Tell your doctor or healthcare professional if your symptoms do not start to get better or if they get worse. You may need blood work done while you are taking this medicine. You may need to follow a special diet. Talk to your doctor. Foods that contain iron include: whole grains/cereals, dried fruits, beans, or peas, leafy green vegetables, and organ meats (liver, kidney). What side effects may I notice from receiving this medicine? Side effects that you should report to your doctor or health care professional as soon as possible: -allergic reactions like skin rash, itching or hives, swelling of the  face, lips, or tongue -breathing problems -changes in blood pressure -feeling faint or lightheaded, falls -fever or chills -flushing, sweating, or hot feelings -swelling of the ankles or feet Side effects that usually do not require medical attention (report to your doctor or health care professional if they continue or are bothersome): -diarrhea -headache -nausea, vomiting -stomach pain Where should I keep my medicine? This drug is given in a hospital or clinic and will not be stored at home.  2017 Elsevier/Gold Standard (2015-11-24 12:41:49)  

## 2016-10-31 ENCOUNTER — Ambulatory Visit (HOSPITAL_BASED_OUTPATIENT_CLINIC_OR_DEPARTMENT_OTHER): Payer: 59

## 2016-10-31 VITALS — BP 128/72 | HR 83 | Temp 98.7°F | Resp 17

## 2016-10-31 DIAGNOSIS — D508 Other iron deficiency anemias: Secondary | ICD-10-CM

## 2016-10-31 DIAGNOSIS — Z9884 Bariatric surgery status: Secondary | ICD-10-CM

## 2016-10-31 DIAGNOSIS — K9089 Other intestinal malabsorption: Secondary | ICD-10-CM | POA: Diagnosis not present

## 2016-10-31 MED ORDER — SODIUM CHLORIDE 0.9 % IV SOLN
510.0000 mg | Freq: Once | INTRAVENOUS | Status: AC
  Administered 2016-10-31: 510 mg via INTRAVENOUS
  Filled 2016-10-31: qty 17

## 2016-10-31 MED ORDER — SODIUM CHLORIDE 0.9 % IV SOLN
Freq: Once | INTRAVENOUS | Status: AC
  Administered 2016-10-31: 10:00:00 via INTRAVENOUS

## 2016-10-31 NOTE — Patient Instructions (Signed)
Ferumoxytol injection What is this medicine? FERUMOXYTOL is an iron complex. Iron is used to make healthy red blood cells, which carry oxygen and nutrients throughout the body. This medicine is used to treat iron deficiency anemia in people with chronic kidney disease. COMMON BRAND NAME(S): Feraheme What should I tell my health care provider before I take this medicine? They need to know if you have any of these conditions: -anemia not caused by low iron levels -high levels of iron in the blood -magnetic resonance imaging (MRI) test scheduled -an unusual or allergic reaction to iron, other medicines, foods, dyes, or preservatives -pregnant or trying to get pregnant -breast-feeding How should I use this medicine? This medicine is for injection into a vein. It is given by a health care professional in a hospital or clinic setting. Talk to your pediatrician regarding the use of this medicine in children. Special care may be needed. What if I miss a dose? It is important not to miss your dose. Call your doctor or health care professional if you are unable to keep an appointment. What may interact with this medicine? This medicine may interact with the following medications: -other iron products What should I watch for while using this medicine? Visit your doctor or healthcare professional regularly. Tell your doctor or healthcare professional if your symptoms do not start to get better or if they get worse. You may need blood work done while you are taking this medicine. You may need to follow a special diet. Talk to your doctor. Foods that contain iron include: whole grains/cereals, dried fruits, beans, or peas, leafy green vegetables, and organ meats (liver, kidney). What side effects may I notice from receiving this medicine? Side effects that you should report to your doctor or health care professional as soon as possible: -allergic reactions like skin rash, itching or hives, swelling of the  face, lips, or tongue -breathing problems -changes in blood pressure -feeling faint or lightheaded, falls -fever or chills -flushing, sweating, or hot feelings -swelling of the ankles or feet Side effects that usually do not require medical attention (report to your doctor or health care professional if they continue or are bothersome): -diarrhea -headache -nausea, vomiting -stomach pain Where should I keep my medicine? This drug is given in a hospital or clinic and will not be stored at home.  2017 Elsevier/Gold Standard (2015-11-24 12:41:49)  

## 2016-11-12 ENCOUNTER — Encounter: Payer: PRIVATE HEALTH INSURANCE | Attending: MD | Primary: Family

## 2016-11-26 DIAGNOSIS — E039 Hypothyroidism, unspecified: Secondary | ICD-10-CM | POA: Diagnosis not present

## 2016-11-26 DIAGNOSIS — D509 Iron deficiency anemia, unspecified: Secondary | ICD-10-CM | POA: Diagnosis not present

## 2016-11-28 NOTE — Telephone Encounter (Signed)
Yes, we can send a referral to ENDOCRINE for this patient.  Dx Code, hypothyroidism.    Please send to Dr Osa Craver and his team please.    Thanks so much

## 2016-11-28 NOTE — Telephone Encounter (Signed)
From: [name]  To: [name], MD  Sent: 11/27/2016 7:26 PM PST  Subject: Referral Request    I'm sorry I had to cancel my appt due to work conflict. But I am wondering since Dr Leonia Reader has retired if I can get a referral to a different doctor if possible. Thank u

## 2016-12-04 ENCOUNTER — Encounter: Payer: PRIVATE HEALTH INSURANCE | Attending: MD | Primary: Family

## 2017-01-18 ENCOUNTER — Other Ambulatory Visit: Admit: 2017-01-18 | Discharge: 2017-01-18 | Payer: BC Managed Care – PPO | Primary: Family

## 2017-01-18 DIAGNOSIS — E063 Autoimmune thyroiditis: Secondary | ICD-10-CM

## 2017-01-18 LAB — T4, FREE: T4, Free: 1.3 ng/dL — ABNORMAL HIGH (ref 0.6–1.2)

## 2017-01-18 LAB — TSH: TSH - Thyroid Stimulating Hormone: 0.03 u[IU]/mL — ABNORMAL LOW (ref 0.45–5.33)

## 2017-01-18 MED ORDER — levothyroxine (SYNTHROID, LEVOTHROID) 125 MCG tablet
125 | ORAL_TABLET | Freq: Every day | ORAL | 0 refills | Status: DC
Start: 2017-01-18 — End: 2017-04-12

## 2017-01-18 NOTE — Telephone Encounter (Signed)
-----   Message from [name], MD sent at 01/18/2017 10:07 AM PDT -----  Please let the patient know that the TSH is low, and the Free T4 is high.  This is hyperthyroidism.  I have not seen this patient in almost two years.  First question, is she seeing a different PCP?  Secondly,   What exactly is she taking in terms of thyroid medications?    I have that she is taking CYTOMEL 5 mcg daily AND LEVOTHYROXINE 150 mcg daily?    Please let me know and we will go from there.  I know she asked me for an endocrinologist, and she does have an appointment with Dr Osa Craver in May 2018.    Thanks so much

## 2017-01-18 NOTE — Telephone Encounter (Signed)
Called pt and let her know Please let the patient know that the TSH is low, and the Free T4 is high.  This is hyperthyroidism. Pt understood.  I have not seen this patient in almost two years.  First question, is she seeing a different PCP? She hasnt changed PCPs  Secondly,   What exactly is she taking in terms of thyroid medications?  I have that she is taking CYTOMEL 5 mcg daily AND LEVOTHYROXINE 150 mcg daily?  she is taking both currently.   I know she asked me for an endocrinologist, and she does have an appointment with Dr Osa Craver in May 2018.    And she will see endo but is wondering what you want her to do till then.  Please advise?

## 2017-01-18 NOTE — Telephone Encounter (Signed)
Yes, this is HYPERTHYROIDISM, with a LOW TSH and a HIGH Free T4  I would like to slight decrease the LEVOTHYROXINE at this time.   Currently patient is on BOTH CYTOMEL and LEVOTHYROXINE>    CYTOMEL 5 mcg daily will remain the same  LEVOTHYROXINE from 150 mcg daily to 125 mcg daily.      Please call patient and let her know the above.  Please then prescribe LEVOTHYROXINE 125 mcg po  daily in the AM on an empty stomach.  Please also order repeat TSH, Free T4,  in 4 weeks please.  Please advise patient to come in to the lab in 4 weeks for a repeat lab draw.  Thanks so much.  Thanks so much for ordering the repeat labs and also the LEVOTHYROXINE 125 mcg po daily

## 2017-01-18 NOTE — Telephone Encounter (Signed)
Spoke with patient and informed her of the change in dose. New dose sent to pharmacy

## 2017-01-22 NOTE — Telephone Encounter (Signed)
Called Kerry Allen and informed her of the note from [facility], MD   She verbalized understanding and she stated that she will be going to [facility]

## 2017-01-22 NOTE — Telephone Encounter (Signed)
Please advise patient to come in to [facility] for evaluation.  Thanks so much.  There is a note in the chart that suggests that she is having pelvic pain, and possible UTI.  Please call patient and please recommend UC and thanks so much

## 2017-01-23 NOTE — Telephone Encounter (Signed)
Just wanted to make sure that this note has been addressed.  Please see the previous note, and I think it would be best if the patient comes in to UC to be evaluated as soon as possible.  Thanks so much

## 2017-01-25 MED ORDER — liothyronine (CYTOMEL) 5 MCG tablet
5 | ORAL_TABLET | Freq: Every day | ORAL | 11 refills | 90.00000 days | Status: DC
Start: 2017-01-25 — End: 2017-06-12

## 2017-02-20 NOTE — Telephone Encounter (Signed)
Message from  [employer] Mulberry      [birth year]    [phone] (home)     Willy Eddy FNP    Comments: Called patient and she was wanting to transfer from Dr. Jorge Ny to [name], scheduled her for April 19, 2017.    Patient_First_Name Kerry Celeste   Patient_Last_Name Allen   phone_number [phone]   DOB May 09, 1976   PCP Dr. Shanon Payor   Email [email or web address]   Additional_information I would like to schedule a new patient appt on a friday. thank you

## 2017-03-04 DIAGNOSIS — E611 Iron deficiency: Secondary | ICD-10-CM | POA: Diagnosis not present

## 2017-03-06 DIAGNOSIS — J209 Acute bronchitis, unspecified: Secondary | ICD-10-CM | POA: Diagnosis not present

## 2017-03-12 ENCOUNTER — Ambulatory Visit: Admit: 2017-03-12 | Discharge: 2017-03-12 | Payer: BC Managed Care – PPO | Attending: "Endocrinology | Primary: Family

## 2017-03-12 DIAGNOSIS — E890 Postprocedural hypothyroidism: Secondary | ICD-10-CM

## 2017-03-12 MED ORDER — phentermine 15 MG capsule
15 | ORAL_CAPSULE | Freq: Every morning | ORAL | 2 refills | Status: AC
Start: 2017-03-12 — End: 2017-06-10

## 2017-03-12 NOTE — Progress Notes (Signed)
DATE OF ENCOUNTER: 03/12/2017  PATIENT: Kerry Allen  MRN: [id number]  DOB: 08/24/1976  REFERRING PROVIDER:  [facility], Vella Kohler,*  PCP: Shanon Payor, MD  CHIEF COMPLAINT:  Hypothyroidism (np)      HISTORY OF PRESENT ILLNESS:     Dear [name], *,    It was a pleasure to see Kerry Allen who is a 41 y.o. female for her thyroid issues. 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 She was treated by Dr. Leonia Reader at the time until his retirement. Her last labs were done in March which showed an elevated free T4 and a suppressed TSH on 150 ?g of levothyroxine daily with 5 ?g of Cytomel. Her dose at that time was decreased to 125 ?g of levothyroxine and a continuation of the Cytomel. She feels reasonably well. Her biggest complaint today is inability to lose weight despite dieting. She's been exercising and cutting back on her calories though weight loss has been elusive. There is no report of any other endocrinopathies such as pernicious anemia, celiac disease, type 1 diabetes, Addison's, vitiligo etc.    I reviewed and summarized old records as part of this visit    PAST MEDICAL HISTORY:     Past Medical History:   Diagnosis Date   . Hashimoto's thyroiditis     Hashimoto's thyroiditis with hyperthyroidism   . Hypothyroidism     Hypothyroidism-Dr. Leonia Reader   . Vitamin D deficiency      Past Surgical History:   Procedure Laterality Date   . TUBAL LIGATION      Tubal ligation 2008     History reviewed. No pertinent family history.  Social History     Social History Main Topics   . Smoking status: Passive Smoke Exposure - Never Smoker   . Smokeless tobacco: Never Used      Comment: parents smoked   . Alcohol use 0.0 oz/week      Comment: occasional    . Drug use: No   . Sexual activity: Not on file     Social History   . Marital status: Married     Spouse name: N/A   . Number of children: N/A   . Years of education: N/A     Other Topics Concern   . Caffeine Concern  Yes     moderate   . Exercise Yes     occasional        Allergies   Allergen Reactions   . Penicillins Other (See Comments)     Outpatient Prescriptions Marked as Taking for the 03/12/17 encounter (Office Visit) with Dierdre Highman, MD   Medication Sig Dispense Refill   . levothyroxine (SYNTHROID, LEVOTHROID) 125 MCG tablet Take 1 tablet by mouth daily. 90 tablet 0   . liothyronine (CYTOMEL) 5 MCG tablet Take 1 tablet by mouth daily. 30 tablet 11       REVIEW OF SYSTEMS:     Review of Systems   Constitutional: Positive for fatigue. Negative for activity change.   HENT: Negative for sore throat and trouble swallowing.    Respiratory: Negative for cough and shortness of breath.    Cardiovascular: Positive for palpitations. Negative for chest pain.   Gastrointestinal: Positive for constipation. Negative for diarrhea.   Endocrine: Positive for cold intolerance. Negative for heat intolerance.   Musculoskeletal: Positive for back pain. Negative for neck pain.   Neurological: Positive for headaches. Negative for dizziness.  Psychiatric/Behavioral: Positive for agitation. The patient is nervous/anxious.      PHYSICAL EXAM:     BP 134/72   Pulse 91   Ht 5' 2.25 (1.581 m)   Wt 167 lb 12.8 oz (76.1 kg)   SpO2 99%   BMI 30.45 kg/m?   Facility age limit for growth percentiles is 20 years.    Physical Exam   Constitutional: She appears well-developed and well-nourished.   HENT:   Head: Normocephalic and atraumatic.   Eyes: Conjunctivae and EOM are normal. Pupils are equal, round, and reactive to light.   Neck: Normal range of motion. Neck supple.   Cardiovascular: Normal rate, regular rhythm and normal heart sounds.    Pulmonary/Chest: Effort normal and breath sounds normal.   Musculoskeletal: Normal range of motion.   Neurological: She is alert. She has normal reflexes.   Skin: Skin is warm and dry.   Psychiatric: She has a normal mood and affect. Her behavior is normal. Thought content normal.     LABS:     Lab Results    Component Value Date    TSH 0.03 (L) 01/18/2017    TSH 0.03 (L) 01/20/2016    TSH 0.84 05/18/2015     Lab Results   Component Value Date    FREET4 1.3 (H) 01/18/2017    FREET4 1.2 01/20/2016    FREET4 1.0 05/18/2015     Lab Results   Component Value Date    T3TOTAL 78 (L) 10/03/2009    T3TOTAL 92 03/23/2009     Lab Results   Component Value Date    FREET3PGDL 3.1 01/20/2016    FREET3PGDL 3.1 05/18/2015    FREET3PGDL 3.5 09/07/2014     Lab Results   Component Value Date    NA 137 01/20/2016    K 4.2 01/20/2016    CL 104 01/20/2016    CO2 27.3 01/20/2016    GLU 95 01/20/2016    BUN 12 01/20/2016    CREATININES 0.73 01/20/2016    CALCIUM 9.1 01/20/2016    AST 14 01/20/2016    ALT 23 01/20/2016    ALKPHOS 71 01/20/2016    BILITOT 0.6 05/18/2015    ALBUMIN 3.8 01/20/2016    GLOB 2.7 01/20/2016    AGRATIO 1.4 01/20/2016    ANIONGAP 5.7 01/20/2016    LABGLOM >60 01/20/2016       Lab Results   Component Value Date    WHITEBLOODCE 9.0 01/20/2016    HGB 12.8 01/20/2016    HCT 39.5 01/20/2016    MCV 84.4 01/20/2016    LABPLAT 337 01/20/2016     STUDIES:         ASSESSMENT AND PLAN:     Problem List Items Addressed This Visit        Unprioritized    Hypothyroidism - Primary (Chronic)     We discussed the fact that she has post ablative Graves' disease and not Hashimoto's thyroiditis as she believed. We will go ahead and check a thyroid stimulating immunoglobulin level though the chance of problems this long after the I-131 in 2002 would be unlikely. We talked about the need for adjustment of her thyroid dose to get her TSH in the normal range. We will have her check labs today and I will call her with results when the return. She has no extrathyroidal manifestations of Graves' disease on her physical exam today.          Relevant Orders    Thyroid  Stimulating Immunoglobulin -Routine    Thyroid Stimulating Hormone -Routine    T4, Free -Routine    T4, Free -Routine    Thyroid Stimulating Hormone -Routine    BMI 30.0-30.9,adult  (Chronic)      We talked about the fact that weight loss is inherently quite difficult and that continued dietary changes would be warranted. We talked about some of the medications that can help with weight loss and we decided to try phentermine 15 mg daily. We talked about the slight chance of hypertension, anxiety etc. on this medication. If she does not aggressively diet while on the phentermine she will have minimal weight loss and she has agreed to consider the paleo diet which is a diet lower in carbohydrates. We will see her back in 3 months to see how her weight is doing and can make further treatment decisions as indicated.         Passive smoke exposure        Return in about 14 weeks (around 06/18/2017) for FU labs 1 week prior.    Marcell Anger MD  [facility] -- Endocrinology  [address] [place] [address]  [place] [address]  Dept: [phone]  Dept Fax: [phone]    The documentation of this record was generated with the assistance of voice recognition software and some words may be phonetically similar but different from what was actually dictated.    Patient verbalized understanding and agreement of the above plan.                [name] received nutritional counseling during today's visit.

## 2017-03-12 NOTE — Assessment & Plan Note (Addendum)
We talked about the fact that weight loss is inherently quite difficult and that continued dietary changes would be warranted. We talked about some of the medications that can help with weight loss and we decided to try phentermine 15 mg daily. We talked about the slight chance of hypertension, anxiety etc. on this medication. If she does not aggressively diet while on the phentermine she will have minimal weight loss and she has agreed to consider the paleo diet which is a diet lower in carbohydrates. We will see her back in 3 months to see how her weight is doing and can make further treatment decisions as indicated.

## 2017-03-12 NOTE — Assessment & Plan Note (Addendum)
We discussed the fact that she has post ablative Graves' disease and not Hashimoto's thyroiditis as she believed. We will go ahead and check a thyroid stimulating immunoglobulin level though the chance of problems this long after the I-131 in 2002 would be unlikely. We talked about the need for adjustment of her thyroid dose to get her TSH in the normal range. We will have her check labs today and I will call her with results when the return. She has no extrathyroidal manifestations of Graves' disease on her physical exam today.

## 2017-03-13 ENCOUNTER — Other Ambulatory Visit: Admit: 2017-03-13 | Discharge: 2017-03-13 | Payer: BC Managed Care – PPO | Primary: Family

## 2017-03-13 DIAGNOSIS — E890 Postprocedural hypothyroidism: Secondary | ICD-10-CM

## 2017-03-13 LAB — TSH: TSH - Thyroid Stimulating Hormone: 0.7 u[IU]/mL (ref 0.45–5.33)

## 2017-03-13 LAB — THYROID STIMULATING IMMUNOGLOBULIN: Thyroid stimulating immunoglobulin: <1 TSI index (ref <=1.3)

## 2017-03-13 LAB — T4, FREE: T4, Free: 1.3 ng/dL — ABNORMAL HIGH (ref 0.6–1.2)

## 2017-03-13 NOTE — Progress Notes (Signed)
Advise thyroid function levels are normal. TSI is still pending.

## 2017-03-13 NOTE — Progress Notes (Signed)
Advise TSI is normal.  No evidence for any residual Graves' Dz.

## 2017-03-18 NOTE — Telephone Encounter (Signed)
-----   Message from Dierdre Highman, MD sent at 03/18/2017 12:44 PM PDT -----  Advise thyroid function levels are normal. TSI is still pending.

## 2017-03-18 NOTE — Telephone Encounter (Signed)
Called and spoke to patient to advise her of lab results. Patient expressed understanding and appreciated the call.

## 2017-03-20 NOTE — Telephone Encounter (Signed)
LMTCB, please inform the patient of her lab results and also ask if it is ok to leave a detailed message.

## 2017-03-20 NOTE — Telephone Encounter (Signed)
-----   Message from Dierdre Highman, MD sent at 03/20/2017  7:50 AM PDT -----  Advise Kerry Allen is normal.  No evidence for any residual Graves' Dz.

## 2017-03-25 NOTE — Telephone Encounter (Signed)
LMTCB, asked the patient to please call us back to go over her lab results. Also asked if it was ok to leave a detailed message.

## 2017-04-02 NOTE — Telephone Encounter (Signed)
LMTCB, 3rd message left, will send a letter.

## 2017-04-03 DIAGNOSIS — R062 Wheezing: Secondary | ICD-10-CM | POA: Diagnosis not present

## 2017-04-03 DIAGNOSIS — R05 Cough: Secondary | ICD-10-CM | POA: Diagnosis not present

## 2017-04-12 NOTE — Telephone Encounter (Signed)
Sees Dr Gallen

## 2017-04-19 ENCOUNTER — Ambulatory Visit: Admit: 2017-04-19 | Payer: BC Managed Care – PPO | Attending: Family | Primary: Family

## 2017-04-19 DIAGNOSIS — R1031 Right lower quadrant pain: Secondary | ICD-10-CM

## 2017-04-19 NOTE — Patient Instructions (Addendum)
Check ovaries. Ultrasound of the pelvis ordered. Scheduling should call you usually in the next week, but if they don't, let us know.     Ordered a mammogram. Call [phone] ([facility] center next to [facility]) to schedule this. They do have Saturday hours (if I am not mistaken) 1 time a month.     Will plan to check bloodwork ordered today. These are fasting labs, so do not eat or drink anything but water for 12 hours prior to blood draw. Also, if you are taking vitamins, please do not take them 24 hours prior to getting blood drawn.     Recommend that you start on the Jarrow brand B-right complex, one capsule daily. You can purchase this through [email or web address]. I like this brand because it has methylated B12, an adequate amount of vitamin B6 and also the l-methylfolate.     Start on inositol 1000 mg twice a day.     Start on vitamin D3 5000 IU daily.     Start on 5 mg of L-methylfolate daily. This is the bioavailable form of folic acid, which is a B-vitamin. Folate is essential for many biological processes in the human body. This vitamin helps the body in many ways, but is especially helpful in improving mood and keeping nerves healthy.    [facility] has the vitamin information.     Biotene for your dry mouth.

## 2017-04-19 NOTE — Progress Notes (Signed)
Kerry Allen is a 41 y.o. female seen today for Pelvic Pain and Establish Care  .  SUBJECTIVE:  Establish Care:  Presents in office today to establish care.  Last PCP was [facility], MD.  Patient is followed by Dr. Osa Craver for Thyroid Disease.    HPI  Pelvic Pain  Complains of right side pelvic pain monthly that lasts about 7 - 10 days.  Started 4 - 5 months ago. Pt states that she had an ablation and also BTL. Her children are 23 and 21.     Hashimoto's Thyroiditis  Pt is being followed by Dr. Osa Craver for her Hashimoto's. She is currently taking 125 mg of levothyroxine and liothyronine 5 mcg daily. Pt states that she is exhausted by the afternoon and in bed early every night. Pt has recently started on phentermine by Dr. Osa Craver in the past month and states that she has not had any improvement in her fatigue since starting the medication.     The following portions of the patient's history were reviewed and updated as appropriate: allergies, current medications, past family history, past medical history, past surgical history and problem list.    MEDICATIONS:  Previous Medications    LEVOTHYROXINE (SYNTHROID, LEVOTHROID) 125 MCG TABLET    Take 1 tablet by mouth daily.    LIOTHYRONINE (CYTOMEL) 5 MCG TABLET    Take 1 tablet by mouth daily.    MULTIVIT-MIN/FOLIC ACID/BIOTIN (HAIR,SKIN AND NAILS,FA-BIOTIN, ORAL)    Take 2 tablets by mouth daily.    PHENTERMINE 15 MG CAPSULE    Take 1 capsule by mouth every morning before breakfast.       ALLERGIES:  Allergies   Allergen Reactions   . Penicillins Other (See Comments)       PAST HISTORY:  Past Medical History:   Diagnosis Date   . hx of Graves' disease 2002    s/p I131  in 2002   . Postablative hypothyroidism     Hypothyroidism-Dr. Leonia Reader 2002   . Vitamin D deficiency      Past Surgical History:   Procedure Laterality Date   . PANNICULECTOMY  2005    tummy tuck   . TUBAL LIGATION      Tubal ligation 2008     Family History   Problem Relation Age of Onset   .  Diabetes Mother    . Cancer Mother    . Heart attack Father      Social History     Social History Main Topics   . Smoking status: Passive Smoke Exposure - Never Smoker   . Smokeless tobacco: Never Used      Comment: passive smoke exposure   . Alcohol use 0.0 oz/week      Comment: occasional    . Drug use: No   . Sexual activity: Yes     Partners: Male     Birth control/ protection: Surgical      Comment: Tubal Ligation     Social History   . Marital status: Married     Spouse name: Jerrod   . Number of children: 2   . Years of education: N/A     Other Topics Concern   . Caffeine Concern Yes     moderate   . Exercise Yes     occasional        Review of Systems   Constitutional: Positive for fatigue. Negative for activity change and appetite change.   HENT: Negative for congestion, dental problem,  ear pain, sinus pain, sinus pressure, tinnitus and trouble swallowing.    Respiratory: Negative for cough and shortness of breath.    Cardiovascular: Negative for chest pain, palpitations and leg swelling.   Gastrointestinal: Positive for constipation. Negative for abdominal pain, diarrhea and nausea.   Genitourinary: Positive for pelvic pain. Negative for difficulty urinating, dysuria, vaginal bleeding, vaginal discharge and vaginal pain.   Musculoskeletal: Positive for arthralgias, back pain and myalgias. Negative for neck pain.   Skin: Negative for rash and wound.   Neurological: Positive for headaches. Negative for dizziness and numbness.   Psychiatric/Behavioral: Positive for sleep disturbance. Negative for dysphoric mood. The patient is not nervous/anxious.        OBJECTIVE:  Vitals:    04/19/17 1403   BP: 114/76   BP Location: Left arm   Patient Position: Sitting   Pulse: 84   Resp: 16   Temp: 37.2 ?C (98.9 ?F)   TempSrc: Oral   SpO2: 98%   Weight: 162 lb 12.8 oz (73.8 kg)   Height: 5' 2.5 (1.588 m)     Body mass index is 29.3 kg/m?Marland Kitchen  No LMP recorded. Patient has had an ablation.    Physical Exam   Constitutional:  She is oriented to person, place, and time. She appears well-developed and well-nourished. No distress.   Cardiovascular: Normal rate and regular rhythm.    Pulmonary/Chest: Effort normal and breath sounds normal.   Musculoskeletal: Normal range of motion.   Neurological: She is alert and oriented to person, place, and time.   Skin: Skin is warm and dry.   Psychiatric:   APPEARANCE: appropriate dress for age and situation    BEHAVIOR: Good eye contact, cooperative,  normal psychomotor activity   SPEECH:Ordinary, Offers information  MOOD:  appropriate to circumstances  AFFECT: normal  THOUGHT PROCESS:  normal, Clarity coherent  THOUGHT Content: appropriate  INSIGHT/JUDGMENT: age appropriate  CONCIOUSNESS: awake and Responds Appropriately  ORIENTATION: person, place and time/date  MEMORY:  intact  ATTENTION/CONCENTRATION: Sufficient  FUND OF KNOWLEDGE:  normal     LEVEL OF SELF ESTEEM:  normal         ASSESSMENT & PLAN:    ICD-10-CM    1. Colicky RLQ abdominal pain R10.31 Ultrasound pelvic non-ob with transvaginal   2. Fatigue, unspecified type R53.83    3. Avitaminosis E56.9 25-OH Vitamin D Total D2+D3 -Routine     Vitamin B12/Folate -Routine     Vitamin B6 -Routine   4. Encounter for laboratory test Z01.89 CBC with Auto Differential -Routine     Comprehensive Metabolic Panel -Routine     Coronary Risk Lipid Panel w/reflex Direct LDL if triglycerides >400 mg/dL -Routine     Urinalysis, Routine -Routine     CRP, High-Sensitivity -Routine     Homocysteine, Total -Routine   5. Screening for breast cancer Z12.31 Mammography tomo screening bilateral digital w cad   6. Depression screening negative Z13.89            Medication List          Accurate as of 04/19/17 11:59 PM. If you have any questions, ask your nurse or doctor.               CONTINUE taking these medications    HAIR,SKIN AND NAILS(FA-BIOTIN) ORAL     levothyroxine 125 MCG tablet  Commonly known as:  SYNTHROID, LEVOTHROID  Take 1 tablet by mouth daily.        liothyronine 5 MCG tablet  Commonly  known as:  CYTOMEL  Take 1 tablet by mouth daily.     phentermine 15 MG capsule  Take 1 capsule by mouth every morning before breakfast.          Patient Instructions   Check ovaries. Ultrasound of the pelvis ordered. Scheduling should call you usually in the next week, but if they don't, let us know.     Ordered a mammogram. Call [phone] (women's imaging center next to [facility]) to schedule this. They do have Saturday hours (if I am not mistaken) 1 time a month.     Will plan to check bloodwork ordered today. These are fasting labs, so do not eat or drink anything but water for 12 hours prior to blood draw. Also, if you are taking vitamins, please do not take them 24 hours prior to getting blood drawn.     Recommend that you start on the [employer] brand B-right complex, one capsule daily. You can purchase this through [email or web address]. I like this brand because it has methylated B12, an adequate amount of vitamin B6 and also the l-methylfolate.     Start on inositol 1000 mg twice a day.     Start on vitamin D3 5000 IU daily.     Start on 5 mg of L-methylfolate daily. This is the bioavailable form of folic acid, which is a B-vitamin. Folate is essential for many biological processes in the human body. This vitamin helps the body in many ways, but is especially helpful in improving mood and keeping nerves healthy.    [facility] has the vitamin information.     Biotene for your dry mouth.      FOLLOW-UP:  Return in about 2 months (around 06/21/2017) for Well-woman examination.     I had a thoughtful and careful discussion regarding the issues today. All questions were answered. I educated and reassured. I encouraged followup as needed. Hand written instructions were given.    This note was transcribed using speech recognition software. Please contact us for clarification if any questions arise relating to the wording of this document.

## 2017-04-26 ENCOUNTER — Other Ambulatory Visit: Admit: 2017-04-26 | Discharge: 2017-04-26 | Payer: BC Managed Care – PPO | Primary: Family

## 2017-04-26 DIAGNOSIS — E569 Vitamin deficiency, unspecified: Secondary | ICD-10-CM

## 2017-04-26 LAB — CORONARY RISK LIPID PANEL REFLEX DIRECT LDL
Cholesterol, HDL: 40 mg/dL (ref >=40)
Cholesterol: 167 mg/dL (ref <200)
LDL Calculated: 112 mg/dL — ABNORMAL HIGH (ref <100)
Triglyceride: 77 mg/dL (ref 30–149)

## 2017-04-26 LAB — CBC WITH AUTO DIFFERENTIAL
Basophils %: 1 % (ref 0–2)
Basophils, Absolute: 0.1 10*3/ÂµL (ref 0.0–0.2)
Eosinophils %: 5 % (ref 0–7)
Eosinophils, Absolute: 0.3 10*3/ÂµL (ref 0.0–0.7)
HCT: 41.7 % (ref 37.0–48.0)
Hemoglobin: 13.6 g/dL (ref 12.0–16.0)
Lymphocytes %: 23 % — ABNORMAL LOW (ref 25–45)
Lymphocytes, Absolute: 1.5 10*3/??L (ref 1.1–4.3)
MCH: 28.9 pg (ref 27.0–34.0)
MCHC: 32.7 g/dL (ref 32.0–36.0)
MCV: 88.3 fL (ref 81.0–99.0)
MPV: 9.9 fL (ref 7.4–10.4)
Monocytes %: 6 % (ref 0–12)
Monocytes, Absolute: 0.4 10*3/ÂµL (ref 0.0–1.2)
Neutrophils %: 65 % (ref 35–70)
Neutrophils, Absolute: 4.3 10*3/ÂµL (ref 1.6–7.3)
Platelet Count: 353 10*3/ÂµL (ref 150–400)
RBC: 4.72 10*6/ÂµL (ref 4.20–5.40)
RDW: 13.1 % (ref 11.5–14.5)
WBC: 6.6 10*3/ÂµL (ref 4.8–10.8)

## 2017-04-26 LAB — COMPREHENSIVE METABOLIC PANEL
ALT - Alanine Aminotransferase: 10 IU/L (ref 7–52)
AST - Aspartate Aminotransferase: 10 IU/L (ref 8–39)
Albumin/Globulin Ratio: 1.4 (ref >0.9)
Albumin: 4.1 g/dL (ref 3.5–5.0)
Alkaline Phosphatase: 66 IU/L (ref 34–104)
Anion Gap: 8 mmol/L (ref 4–13)
BUN: 16 mg/dL (ref 6–23)
Bilirubin Total: 0.5 mg/dL (ref 0.3–1.2)
CO2 - Carbon Dioxide: 27 mmol/L (ref 21–31)
Calcium: 9 mg/dL (ref 8.6–10.3)
Chloride: 105 mmol/L (ref 98–111)
Creatinine: 0.78 mg/dL (ref 0.55–1.10)
GFR Estimate: >60 mL/min/{1.73_m2} (ref >=60)
Globulin: 2.9 g/dL (ref 2.2–3.7)
Glucose: 95 mg/dL (ref 80–99)
Potassium: 4.2 mmol/L (ref 3.5–5.1)
Protein Total: 7 g/dL (ref 6.0–8.0)
Sodium: 140 mmol/L (ref 135–143)

## 2017-04-26 LAB — VITAMIN B6: Vitamin B6 (Pyridoxal 5-Phosphate): 7 mcg/L (ref 5–50)

## 2017-04-26 LAB — HOMOCYSTEINE, PLASMA: Homocysteine: 8.4 umol/L (ref <=13.0)

## 2017-04-26 LAB — VITAMIN B12/FOLATE
Folate: 8.9 ng/mL (ref 5.9–24.8)
Vitamin B12: 529 pg/mL (ref 150–840)

## 2017-04-26 LAB — C-REACTIVE PROTEIN (HIGH SENSITIVITY): C-Reactive Protein High Sensitivity: 12.58 mg/L — ABNORMAL HIGH (ref <=3.00)

## 2017-04-26 LAB — 25-OH HYDROXY VITAMIN D, CALCIFEROL, TOTAL OF D2 & D3: Vitamin D, 25-OH Total: 33.2 ng/mL (ref 30.0–100.0)

## 2017-05-01 ENCOUNTER — Encounter (HOSPITAL_COMMUNITY): Payer: Self-pay

## 2017-05-06 NOTE — Telephone Encounter (Signed)
Darrin Nipper, CMA 05/06/2017 9:02 AM PDT        ----- Message -----  From: [name]  Sent: 05/03/2017 7:39 PM  To: [facility] [address] [facility]  Subject: Test Results Question     It looks like all the labs are done so just double checking if everything looked ok.     Thank you,    Mardene Celeste

## 2017-05-10 ENCOUNTER — Inpatient Hospital Stay: Admit: 2017-05-10 | Discharge: 2017-05-10 | Payer: BC Managed Care – PPO | Attending: Family | Primary: Family

## 2017-05-10 DIAGNOSIS — N83201 Unspecified ovarian cyst, right side: Secondary | ICD-10-CM

## 2017-05-13 NOTE — Telephone Encounter (Signed)
Spoke to patient - informed of results of imaging as below.   Patient agrees to referral.   Referral entered.   All questions answered at this time.

## 2017-05-13 NOTE — Telephone Encounter (Addendum)
-----   Message from [name], FNP sent at 05/11/2017  3:51 AM PDT -----  Recommend referral to gyn. If she is ok with it, referrral to Dr. Etheleen Sia pelvic ultrasound.    IMPRESSION:  ?  1. Probable myomatous changes of the uterus.  2. There are complex RIGHT ovarian cysts. Follow-up ultrasound in 3-6 weeks   could be performed to ensure stability/resolution. Alternatively, MR of the   female pelvis could be performed.  3. Simple LEFT ovarian cyst.

## 2017-05-17 ENCOUNTER — Inpatient Hospital Stay: Admit: 2017-05-17 | Payer: BC Managed Care – PPO | Attending: Family | Primary: Family

## 2017-05-17 ENCOUNTER — Ambulatory Visit: Payer: BC Managed Care – PPO | Primary: Family

## 2017-05-17 DIAGNOSIS — Z1231 Encounter for screening mammogram for malignant neoplasm of breast: Secondary | ICD-10-CM

## 2017-05-21 DIAGNOSIS — Z01411 Encounter for gynecological examination (general) (routine) with abnormal findings: Secondary | ICD-10-CM | POA: Diagnosis not present

## 2017-05-21 DIAGNOSIS — Z124 Encounter for screening for malignant neoplasm of cervix: Secondary | ICD-10-CM | POA: Diagnosis not present

## 2017-05-21 DIAGNOSIS — N921 Excessive and frequent menstruation with irregular cycle: Secondary | ICD-10-CM | POA: Diagnosis not present

## 2017-05-21 DIAGNOSIS — N926 Irregular menstruation, unspecified: Secondary | ICD-10-CM | POA: Diagnosis not present

## 2017-06-07 ENCOUNTER — Other Ambulatory Visit: Admit: 2017-06-07 | Discharge: 2017-06-07 | Payer: BC Managed Care – PPO | Primary: Family

## 2017-06-07 DIAGNOSIS — E890 Postprocedural hypothyroidism: Secondary | ICD-10-CM

## 2017-06-07 LAB — TSH: TSH - Thyroid Stimulating Hormone: 0.79 u[IU]/mL (ref 0.45–5.33)

## 2017-06-07 LAB — T4, FREE: T4, Free: 1 ng/dL (ref 0.6–1.2)

## 2017-06-12 ENCOUNTER — Ambulatory Visit: Admit: 2017-06-12 | Discharge: 2017-06-12 | Payer: BC Managed Care – PPO | Attending: "Endocrinology | Primary: Family

## 2017-06-12 DIAGNOSIS — Z6829 Body mass index (BMI) 29.0-29.9, adult: Secondary | ICD-10-CM

## 2017-06-12 MED ORDER — levothyroxine (SYNTHROID, LEVOTHROID) 125 MCG tablet
125 | ORAL_TABLET | Freq: Every day | ORAL | 3 refills | Status: DC
Start: 2017-06-12 — End: 2018-07-24

## 2017-06-12 MED ORDER — liothyronine (CYTOMEL) 5 MCG tablet
5 | ORAL_TABLET | Freq: Every day | ORAL | 3 refills | 90.00000 days | Status: DC
Start: 2017-06-12 — End: 2018-11-18

## 2017-06-12 NOTE — Progress Notes (Signed)
DATE OF ENCOUNTER: 06/12/2017  PATIENT: Kerry Allen  MRN: [id number]  DOB: 14-Jan-1976  REFERRING PROVIDER:  [facility], Vella Kohler,*  PCP: [name], FNP  CHIEF COMPLAINT:  Hypothyroidism      LAKASHA MCFALL is a 41 y.o. female who is seen today for Hypothyroidism       Dear Kerry Allen,    It was a pleasure to see Kerry Allen back for her post ablative hypothyroidism. Her most recent TSH and free T4 are normal. She feels well with good energy. We tried her on a low-dose of phentermine which she said did not make any appreciable difference. She continues on her diet.    PAST MEDICAL HISTORY:     Past Medical History:   Diagnosis Date   . hx of Graves' disease 2002    s/p I131  in 2002   . Postablative hypothyroidism     Hypothyroidism-Dr. Leonia Reader 2002   . Vitamin D deficiency        Past Surgical History:   Procedure Laterality Date   . cervix biopsy      some cancer present   . PANNICULECTOMY  2005    tummy tuck   . TUBAL LIGATION      Tubal ligation 2008       Family History   Problem Relation Age of Onset   . Diabetes Mother    . Cancer Mother    . Heart attack Father    . Breast cancer Neg Hx    . Ovarian cancer Neg Hx        Social History     Social History Main Topics   . Smoking status: Passive Smoke Exposure - Never Smoker   . Smokeless tobacco: Never Used      Comment: passive smoke exposure   . Alcohol use 0.0 oz/week      Comment: occasional    . Drug use: No   . Sexual activity: Yes     Partners: Male     Birth control/ protection: Surgical      Comment: Tubal Ligation     Social History   . Marital status: Married     Spouse name: Jerrod   . Number of children: 2   . Years of education: N/A     Other Topics Concern   . Caffeine Concern Yes     moderate   . Exercise Yes     occasional        Allergies   Allergen Reactions   . Penicillins Other (See Comments)       MEDICATIONS:     Outpatient Prescriptions Marked as Taking for the 06/12/17 encounter (Office Visit) with Dierdre Highman, MD   Medication Sig  Dispense Refill   . levothyroxine (SYNTHROID, LEVOTHROID) 125 MCG tablet Take 1 tablet by mouth daily. 90 tablet 3   . liothyronine (CYTOMEL) 5 MCG tablet Take 1 tablet by mouth daily. 30 tablet 11   . multivit-min/folic acid/biotin (HAIR,SKIN AND NAILS,FA-BIOTIN, ORAL) Take 2 tablets by mouth daily.     . phentermine 15 MG capsule Take 15 mg by mouth every morning.       REVIEW OF SYSTEMS:     Review of Systems   Constitutional: Positive for chills and fatigue.   HENT: Positive for sore throat. Negative for trouble swallowing.        PHYSICAL EXAM:   BP 120/72   Pulse 98   Ht 5' 2.5 (1.588 m)   Wt 164  lb 3.2 oz (74.5 kg)   SpO2 97%   BMI 29.55 kg/m?     Physical Exam   Constitutional: She appears well-developed and well-nourished.   HENT:   Head: Normocephalic and atraumatic.   Eyes: Conjunctivae and EOM are normal. Pupils are equal, round, and reactive to light.   Neck: Normal range of motion. Neck supple.   Cardiovascular: Normal rate, regular rhythm and normal heart sounds.    Pulmonary/Chest: Effort normal and breath sounds normal.   Musculoskeletal: Normal range of motion.   Neurological: She is alert. She has normal reflexes.   Skin: Skin is warm and dry.   Psychiatric: She has a normal mood and affect. Her behavior is normal. Thought content normal.       LABS:     Lab Results   Component Value Date    TSH 0.79 06/07/2017    TSH 0.70 03/13/2017    TSH 0.03 (L) 01/18/2017     Lab Results   Component Value Date    FREET4 1.0 06/07/2017    FREET4 1.3 (H) 03/13/2017    FREET4 1.3 (H) 01/18/2017       Lab Results   Component Value Date    T3TOTAL 78 (L) 10/03/2009    T3TOTAL 92 03/23/2009     Lab Results   Component Value Date    FREET3PGDL 3.1 01/20/2016    FREET3PGDL 3.1 05/18/2015    FREET3PGDL 3.5 09/07/2014     Lab Results   Component Value Date    NA 140 04/26/2017    K 4.2 04/26/2017    CL 105 04/26/2017    CO2 27 04/26/2017    GLU 95 04/26/2017    BUN 16 04/26/2017    CREATININES 0.78 04/26/2017     CALCIUM 9.0 04/26/2017    AST 10 04/26/2017    ALT 10 04/26/2017    ALKPHOS 66 04/26/2017    BILITOT 0.6 05/18/2015    ALBUMIN 4.1 04/26/2017    GLOB 2.9 04/26/2017    AGRATIO 1.4 04/26/2017    ANIONGAP 8 04/26/2017    LABGLOM >60 04/26/2017     STUDIES:         ASSESSMENT AND PLAN:     Problem List Items Addressed This Visit        Unprioritized    Hypothyroidism (Chronic)     She appears to be doing well on the thyroid hormone replacement with no issues. I've asked her to continue it for now. I refilled her medications for the next year though we agreed that she would follow up with you in the future in order to eliminate multiple physician visits. I will see her back on an as-needed basis.          Relevant Medications    levothyroxine (SYNTHROID, LEVOTHROID) 125 MCG tablet    liothyronine (CYTOMEL) 5 MCG tablet    BMI 29.0-29.9,adult - Primary (Chronic)    Passive smoke exposure      Other Visit Diagnoses     Hyperthyroidism        Relevant Medications    levothyroxine (SYNTHROID, LEVOTHROID) 125 MCG tablet    liothyronine (CYTOMEL) 5 MCG tablet            Return if symptoms worsen or fail to improve.    Marcell Anger MD  [facility] -- Endocrinology  [address] [place] [address]  [place] [address]  Dept: [phone]  Dept Fax: [phone]    The documentation  of this record was generated with the assistance of voice recognition software and some words may be phonetically similar but different from what was actually dictated.    Patient verbalized understanding and agreement of the above plan.

## 2017-06-12 NOTE — Assessment & Plan Note (Signed)
She appears to be doing well on the thyroid hormone replacement with no issues. I've asked her to continue it for now. I refilled her medications for the next year though we agreed that she would follow up with you in the future in order to eliminate multiple physician visits. I will see her back on an as-needed basis.

## 2017-06-18 DIAGNOSIS — J309 Allergic rhinitis, unspecified: Secondary | ICD-10-CM | POA: Diagnosis not present

## 2017-06-18 DIAGNOSIS — R439 Unspecified disturbances of smell and taste: Secondary | ICD-10-CM | POA: Diagnosis not present

## 2017-06-20 DIAGNOSIS — M79601 Pain in right arm: Secondary | ICD-10-CM | POA: Diagnosis not present

## 2017-06-20 DIAGNOSIS — M542 Cervicalgia: Secondary | ICD-10-CM | POA: Diagnosis not present

## 2017-06-20 DIAGNOSIS — M25511 Pain in right shoulder: Secondary | ICD-10-CM | POA: Diagnosis not present

## 2017-06-21 ENCOUNTER — Encounter: Payer: BC Managed Care – PPO | Attending: Family | Primary: Family

## 2017-06-21 NOTE — Procedures (Signed)
-  Arrival time:0600 please call [phone] with any changes in time of surgery     -Instructed to:   Remain NPO after midnight   Hold all supplements one week prior to surgery   Have a planned ride home   Remove all jewelry and leave valuables at home   Bring minimal personal care items   Where to arrive at patient registration   Use Sage wipes or Hibiclens per protocol  -Medications to take morning of surgery includes thyroid medications     -MRSA ordered for DOS

## 2017-06-24 DIAGNOSIS — M25511 Pain in right shoulder: Secondary | ICD-10-CM | POA: Diagnosis not present

## 2017-06-24 DIAGNOSIS — M542 Cervicalgia: Secondary | ICD-10-CM | POA: Diagnosis not present

## 2017-06-24 DIAGNOSIS — M79601 Pain in right arm: Secondary | ICD-10-CM | POA: Diagnosis not present

## 2017-06-26 DIAGNOSIS — M79601 Pain in right arm: Secondary | ICD-10-CM | POA: Diagnosis not present

## 2017-06-26 DIAGNOSIS — M542 Cervicalgia: Secondary | ICD-10-CM | POA: Diagnosis not present

## 2017-06-26 DIAGNOSIS — M25511 Pain in right shoulder: Secondary | ICD-10-CM | POA: Diagnosis not present

## 2017-06-26 NOTE — H&P (Signed)
HISTORY OF PRESENT ILLNESS:  The patient is a 41 year old referred by [name].  She has had chronic, rather sharp right lower quadrant pain that has   been increasing over the last 7 months.  It was initially occurring cyclically   for 7-10 days each month, but over the last few months, pain has been chronic.    She rates pain at 7/10 when it is bad.  It is worse when she is sitting.  It   feels better to walk and press on the right lower quadrant with her hand.  She   has not missed work.  Bladder and bowel function has been normal.  She denies   fever, although right lower quadrant can feel hot when she has the pain.  She   denies dyspareunia, abnormal discharge, pain with bladder fullness or dysuria.    She has noted increased urinary frequency.  Of note, she is status post NovaSure  endometrial ablation and has been amenorrheic subsequently.  She denies colicky   suprapubic pain.    Gynecologic ultrasound on 05/29/2017 demonstrated normal size anterior uterus   and measured 5 x 4.3 x 4.1 cm.  There was a small fluid collection within the   uterine fundus, status post endometrial ablation.  Endometrium was otherwise not  well seen secondary to ablation.  In the adnexa, right ovary was normal and   measured 2.7 cm.  Left ovary was normal and measured about 2.1 cm.  There was no  adnexal mass, no free fluid and no pelvic tenderness with a transducer.    Lab tests obtained 04/26/2017 demonstrated normal CBC, CMP, lipids and   homocysteine.  High sensitivity CRP was elevated at 12.5 (normal less than 3).    PAST MEDICAL HISTORY:  Significant for thyroid dysfunction.    PRESENT MEDICATIONS:  1.  Levothyroxine 125 mcg daily.  2.  Liothyronine 5 mcg daily.  3.  Phentermine 15 mg daily.    ALLERGIES:  PENICILLIN.    PAST SURGICAL HISTORY:  She is status post vaginal birth x2, laparoscopic   bilateral tubal ligation and endometrial ablation (NovaSure) in 2011.    Abdominoplasty.    SOCIAL HISTORY:  The patient  is married and she is sexually active.  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  She is a nonsmoker, does  not abuse alcohol or other drugs.    FAMILY HISTORY:  Noncontributory.    REVIEW OF SYSTEMS:  Otherwise unremarkable, except as noted above.    PHYSICAL EXAMINATION:  GENERAL:  This is a delightful, intelligent female, in no acute distress.  VITAL SIGNS:  She is 5 feet 2 inches.  Her weight is 165.  BMI is 30.2.  BP   114/72, pulse is 83.  HEENT:  Normal.  NECK:  Thyroid is normal.  CHEST:  Clear.  CARDIOVASCULAR:  Reveals a regular rate and rhythm.  There is no murmur.  BREASTS:  Exam is negative.  There is no mass, skin change or adenopathy and   most recent mammogram in 2017 was normal.  ABDOMEN:  Reveals mild tenderness in the right lower quadrant at McBurney'S   point.  There are no peritoneal signs, fascial defects or mass.  She is status   post abdominoplasty that healed well.  PELVIC:  External genitalia is normal.  Vagina is normal.  There is good   support.  Cervix is normal, good support, and her most recent Pap was  normal in   2016.  There is no cervical lesion.  On bimanual exam, uterus is anterior,   anteflexed, normal size and nontender.  The adnexa are negative.  No mass or   tenderness.  There is no pelvic or uterosacral nodularity or induration.  EXTREMITIES:  Normal.    IMPRESSION:  1.  Chronic and progressive right lower quadrant pain in the area McBurney's   point, probably secondary to periappendiceal and/or periadnexal adhesions.  2.  Other medical diagnoses:  Thyroid dysfunction (she is euthyroid on her   present regimen).  3.  Prior surgery:  Normal vaginal birth x2, laparoscopic bilateral tubal   ligation in 2003 (findings were normal she states), thyroid ablation for Graves'  disease, abdominoplasty in 2005, and NovaSure endometrial ablation per Dr. Brooke Dare   in 2011.  She has been amenorrheic subsequently.  4.  Contraception:  She is status post bilateral  tubal ligation.  5.  ALLERGIES:  PENICILLIN.  6.  Tobacco USE:  No.  7.  BMI:  30.    PLAN:  I recommend laparoscopy with lysis of adhesions and appendectomy as an   outpatient at [facility].  I will make a primary laparoscopic port site a few   centimeters above the umbilicus in the midline, as she is status post   abdominoplasty.  I have discussed these procedures with the patient in the   context of a PARQ conference.  Her questions were encouraged and answered and   she does wish to proceed as outlined.    Clarene Critchley. Florian Buff, MD    PWS/nlrowvr  D:  06/26/2017  9:05 A   T:  06/26/2017  10:14 A   TID:  [id number]  RECEIPT:    [id number]

## 2017-06-26 NOTE — Anesthesia Pre-Procedure Evaluation (Signed)
Anesthesia Evaluation     Patient summary reviewed    No history of anesthetic complications     Airway   Mallampati: II  TM distance: > 8 cm  Neck ROM: full  Dental - normal exam     Pulmonary - negative ROS   (-) wheezes, stridor  Cardiovascular - negative ROS  Exercise tolerance: good (4-7 METS)    Rhythm: regular  Rate: normal    Neuro/Psych - negative ROS     GI/Hepatic/Renal    (+) GERD,     Comments: RLQ pain thought due to appendix by gyn    Endo/Other  (+) , hypothyroidism (s/p ablation for grave's)   Musculoskeletal - negative ROS                   Anesthesia Plan    ASA 2     general     intravenous induction   Anesthetic Plan, Alternatives, Risks and Questions discussed with patient.    Pre-Anesthesia Evaluation Completed at:  06/27/2017 7:36 AM

## 2017-06-27 LAB — BETA-HCG, URINE, QUALITATIVE
Specific Gravity, Urine: 1.014 (ref 1.003–1.030)
hCG, Qualitative: NEGATIVE

## 2017-06-27 MED ORDER — hydrALAZINE (APRESOLINE) injection 10 mg
20 | INTRAMUSCULAR | Status: DC | PRN
Start: 2017-06-27 — End: 2017-06-27

## 2017-06-27 MED ORDER — flumazenil (ROMAZICON) injection 0.2 mg
0.1 | Freq: Once | INTRAVENOUS | Status: DC
Start: 2017-06-27 — End: 2017-06-27

## 2017-06-27 MED ORDER — HYDROmorphone (DILAUDID) 0.5 mg/0.5 mL injection
0.5 | INTRAMUSCULAR | Status: AC
Start: 2017-06-27 — End: ?

## 2017-06-27 MED ORDER — meperidine (PF) (DEMEROL) injection 12.5-50 mg
50 | INTRAMUSCULAR | Status: DC | PRN
Start: 2017-06-27 — End: 2017-06-27

## 2017-06-27 MED ORDER — fentaNYL (PF) (SUBLIMAZE) injection
50 | INTRAMUSCULAR | Status: DC | PRN
Start: 2017-06-27 — End: 2017-06-27
  Administered 2017-06-27: 15:00:00 50 mcg/mL via INTRAVENOUS

## 2017-06-27 MED ORDER — naloxone (NARCAN) injection 0.08 mg
0.4 | INTRAMUSCULAR | Status: DC | PRN
Start: 2017-06-27 — End: 2017-06-27

## 2017-06-27 MED ORDER — rocuronium (ZEMURON) injection
10 | INTRAVENOUS | Status: DC | PRN
Start: 2017-06-27 — End: 2017-06-27
  Administered 2017-06-27 (×2): 10 mg/mL via INTRAVENOUS

## 2017-06-27 MED ORDER — neostigmine (PROSTIGMINE) injection
3 | INTRAVENOUS | Status: DC | PRN
Start: 2017-06-27 — End: 2017-06-27
  Administered 2017-06-27: 16:00:00 3 mg/ mL (1 mg/mL) via INTRAVENOUS

## 2017-06-27 MED ORDER — metoclopramide HCl (REGLAN) injection
5 | INTRAMUSCULAR | Status: DC | PRN
Start: 2017-06-27 — End: 2017-06-27
  Administered 2017-06-27: 16:00:00 5 mg/mL via INTRAVENOUS

## 2017-06-27 MED ORDER — bupivacaine-EPINEPHrine 0.5 %-1:200,000 injection
0.5 | INTRAMUSCULAR | Status: DC | PRN
Start: 2017-06-27 — End: 2017-06-27
  Administered 2017-06-27: 16:00:00 0.5 %-1:200,000 via INTRAPLEURAL

## 2017-06-27 MED ORDER — ePHEDrine sulfate 50 mg/mL injection 10 mg
50 | INTRAVENOUS | Status: DC | PRN
Start: 2017-06-27 — End: 2017-06-27

## 2017-06-27 MED ORDER — fentaNYL (PF) (SUBLIMAZE) 50 mcg/mL injection
50 | INTRAMUSCULAR | Status: AC
Start: 2017-06-27 — End: ?

## 2017-06-27 MED ORDER — haloperidol lactate (HALDOL) injection 0.5-2 mg
5 | Freq: Once | INTRAMUSCULAR | Status: DC | PRN
Start: 2017-06-27 — End: 2017-06-27

## 2017-06-27 MED ORDER — labetalol (NORMODYNE,TRANDATE) injection 5 mg
5 | INTRAVENOUS | Status: DC | PRN
Start: 2017-06-27 — End: 2017-06-27

## 2017-06-27 MED ORDER — dexamethasone sodium phos (PF) (DECADRON) injection
10 | INTRAMUSCULAR | Status: DC | PRN
Start: 2017-06-27 — End: 2017-06-27
  Administered 2017-06-27: 15:00:00 10 mg/mL via INTRAVENOUS

## 2017-06-27 MED ORDER — lidocaine 20 mg/mL (2 %) injection
20 | INTRAMUSCULAR | Status: DC | PRN
Start: 2017-06-27 — End: 2017-06-27
  Administered 2017-06-27: 15:00:00 20 mg/mL (2 %) via INTRAVENOUS

## 2017-06-27 MED ORDER — diphenhydrAMINE (BENADRYL) injection 25 mg
50 | INTRAMUSCULAR | Status: DC | PRN
Start: 2017-06-27 — End: 2017-06-27

## 2017-06-27 MED ORDER — lactated Ringers infusion
INTRAVENOUS | Status: DC
Start: 2017-06-27 — End: 2017-06-27
  Administered 2017-06-27 (×2): via INTRAVENOUS

## 2017-06-27 MED ORDER — ondansetron (ZOFRAN-ODT) disintegrating tablet
4 | Freq: Four times a day (QID) | ORAL | Status: DC | PRN
Start: 2017-06-27 — End: 2017-06-27

## 2017-06-27 MED ORDER — glycopyrrolate (ROBINUL) injection
0.2 | INTRAMUSCULAR | Status: DC | PRN
Start: 2017-06-27 — End: 2017-06-27
  Administered 2017-06-27: 16:00:00 0.2 mg/mL via INTRAVENOUS

## 2017-06-27 MED ORDER — glycopyrrolate (ROBINUL) injection 0.4 mg
0.2 | INTRAMUSCULAR | Status: DC | PRN
Start: 2017-06-27 — End: 2017-06-27

## 2017-06-27 MED ORDER — fentaNYL (PF) (SUBLIMAZE) injection 25-50 mcg
50 | INTRAMUSCULAR | Status: DC | PRN
Start: 2017-06-27 — End: 2017-06-27
  Administered 2017-06-27: 16:00:00 50 ug via INTRAVENOUS

## 2017-06-27 MED ORDER — belladonna alkaloids-opium (B&O SUPPRETTES) 16.2-30 mg suppository
16.2-30 | RECTAL | Status: AC
Start: 2017-06-27 — End: ?

## 2017-06-27 MED ORDER — clindamycin (CLEOCIN) 900 mg in D5W IVPB Premix
900 | INTRAVENOUS | Status: AC | PRN
Start: 2017-06-27 — End: 2017-06-27
  Administered 2017-06-27: 15:00:00 900 mg via INTRAVENOUS

## 2017-06-27 MED ORDER — sodium chloride (NS) 0.9% irrigation
0.9 | Status: DC | PRN
Start: 2017-06-27 — End: 2017-06-27
  Administered 2017-06-27: 16:00:00 0.9 % irrigation

## 2017-06-27 MED ORDER — prochlorperazine (COMPAZINE) injection 10 mg
10 | Freq: Four times a day (QID) | INTRAMUSCULAR | Status: DC | PRN
Start: 2017-06-27 — End: 2017-06-27

## 2017-06-27 MED ORDER — ipratropium-albuterol (DUO-NEB) 0.5 mg-3 mg(2.5 mg base)/3 mL nebulizer solution 3 mL
0.5 | RESPIRATORY_TRACT | Status: DC | PRN
Start: 2017-06-27 — End: 2017-06-27

## 2017-06-27 MED ORDER — belladonna alkaloids-opium (B&O SUPPRETTES) 16.2-30 mg suppository
16.2-30 | RECTAL | Status: DC | PRN
Start: 2017-06-27 — End: 2017-06-27
  Administered 2017-06-27: 16:00:00 via RECTAL

## 2017-06-27 MED ORDER — clindamycin (CLEOCIN) 900 mg/50 mL in D5W IVPB Premix
900 | INTRAVENOUS | Status: AC
Start: 2017-06-27 — End: ?

## 2017-06-27 MED ORDER — HYDROmorphone (DILAUDID) injection 0.4-0.8 mg
0.5 | INTRAMUSCULAR | Status: DC | PRN
Start: 2017-06-27 — End: 2017-06-27
  Administered 2017-06-27: 16:00:00 0.5 mg via INTRAVENOUS

## 2017-06-27 MED ORDER — HYDROmorphone (DILAUDID) injection 0.2-0.4 mg
0.5 | INTRAMUSCULAR | Status: DC | PRN
Start: 2017-06-27 — End: 2017-06-27

## 2017-06-27 MED ORDER — propofol (DIPRIVAN) injection
10 | INTRAVENOUS | Status: DC | PRN
Start: 2017-06-27 — End: 2017-06-27
  Administered 2017-06-27: 15:00:00 10 mg/mL via INTRAVENOUS

## 2017-06-27 MED ORDER — gentamicin 372.4 mg in sodium chloride 0.9 % (NS) 100 mL IVPB
40 | INTRAVENOUS | Status: AC | PRN
Start: 2017-06-27 — End: 2017-06-27
  Administered 2017-06-27: 15:00:00 via INTRAVENOUS

## 2017-06-27 MED FILL — FENTANYL (PF) 50 MCG/ML INJECTION SOLUTION: 50 ug/mL | INTRAMUSCULAR | Qty: 2

## 2017-06-27 MED FILL — HYDROMORPHONE 0.5 MG/0.5 ML INJECTION SYRINGE: 0.5 | INTRAMUSCULAR | Qty: 0.5

## 2017-06-27 MED FILL — GENTAMICIN 40 MG/ML INJECTION SOLUTION: 40 mg/mL | INTRAMUSCULAR | Qty: 9.31

## 2017-06-27 MED FILL — BELLADONNA-OPIUM 16.2 MG-30 MG RECTAL SUPPOSITORY: 16.2-30 mg | RECTAL | Qty: 1

## 2017-06-27 MED FILL — CLINDAMYCIN 900 MG/50 ML IN 5 % DEXTROSE INTRAVENOUS PIGGYBACK: 900 mg/50 mL | INTRAVENOUS | Qty: 50

## 2017-06-27 NOTE — Interval H&P Note (Signed)
I have assessed the patient.    No interval change in H&P.    [facility] Patient Class Confirmed.

## 2017-06-27 NOTE — Op Note (Signed)
DATE OF PROCEDURE:  06/27/2017    PREOPERATIVE DIAGNOSIS:  Chronic right lower quadrant pain, probably secondary   to periadnexal, pericecal or periappendiceal adhesions.    POSTOPERATIVE DIAGNOSES:  1.  Chronic right lower quadrant pain secondary to pericecal adhesions to   anterior abdominal wall.  2.  Moderately extensive pelvic peritoneal endometriosis.  3.  Periadnexal adhesions due to endometriosis.    OPERATION PERFORMED:  Laparoscopy with lysis of adhesions and argon beam   coagulator treatment of moderately extensive pelvic endometriosis.    SURGEON:  Rolene Arbour, MD and Rubin Payor, nurse practitioner.    ANESTHESIA:  Cora Daniels, MD, general endotracheal.    FINDINGS:  Within the pelvis, the uterus was normal size.  Both fallopian tubes   were status post mid salpingectomy for sterilization.  She had small   hydrosalpinx on the medial aspect of the right and left tubal segments.  There were   periovarian adhesions to the posterior aspect of the broad ligament   bilaterally, but I did not see any endometriosis involving ovaries.  She had   moderately extensive endometriosis on bilateral uterosacral ligaments, right  greater than left.  These were ablated with argon beam coagulator.  I  did not see endometriosis elsewhere in the pelvis or abdomen.  Within the upper   abdomen, the appendix was normal, although was adherent to the pelvic brim on   the right.  The cecum was adherent to the anterior abdominal peritoneum.  These   adhesions were lysed.  The liver was normal size, had a smooth surface and a   sharp edge.  The gallbladder was normal.  The remainder of the bowel that I   could see was normal.    PROCEDURE:  The patient was properly identified.  General anesthesia was   induced.  She was positioned in lithotomy on Allen stirrups.  Perineum and   vagina was prepped and draped.  A Foley catheter was placed to dependent   drainage.  A speculum was placed in the vagina.  Cervix was grasped by its     anterior lip with a single tooth tenaculum.  An Acorn cannula was introduced.  I  changed gloves.  Abdomen was prepped and draped and main laparoscopic incision,   12 mm in length was made in the midline about 4 cm above the umbilicus.  Veress   needle was introduced.  Safe entry was evident and CO2 insufflation to 15 mmHg   was carried out.  The Veress needle was removed and a 12 mm bladeless trocar and  cannula was introduced in safe entry was evident.  Two additional 5 mm ports   were placed, 1 in the midline suprapubic area and 1 in the left paramedian area.   I used the Harmonic scalpel to lyse the right pericecal adhesions.  She also   had perisigmoid adhesions to anterior abdominal wall which I lysed.  Within the   pelvis, the endometriosis involving the pelvic peritoneum as described above was  treated with the argon beam coagulator taking care not to injure   retroperitoneal structures.  Pelvis was then thoroughly irrigated with saline   and suctioned.  I then instilled 0.5% Marcaine with epinephrine over the treated  sites, a total of about 10 mL.  There was good hemostasis.  Instruments were   removed.  Carbon dioxide was allowed to escape and the incisions were closed.  I  used interrupted 0 Vicryl on the fascia at the umbilicus  and I used interrupted   subcuticular 4-0 Monocryl on the skin.  Dermabond was applied.  The Foley   catheter and vaginal instruments were removed.  The patient was awakened from   anesthesia, taken to recovery room in good condition.    ESTIMATED BLOOD LOSS:  Negligible.    COMPLICATIONS:  There were no complications.    Clarene Critchley. Florian Buff, MD    PWS/lizfmt  D:  06/27/2017  9:15 A   T:  06/27/2017  9:57 A   TID:  [id number]  RECEIPT:    [id number]  cc:  [name], FNP

## 2017-06-27 NOTE — Brief Op Note (Signed)
Brief Operative Note    Procedure(s) (LRB):  LAPAROSCOPY;  ADHESIOLYSIS; ARGON BEAM TREATMENT OF EXTENSIVE ENDOMETRIOSIS (Right)     Preoperative Diagnoses:   Right lower quadrant abdominal pain [R10.31]    Postoperative Diagnoses:  Post-Op Diagnosis Codes:     * Right lower quadrant abdominal pain [R10.31]     Surgeons: Surgeon(s) and Role:     * Rolene Arbour, MD - Primary    Assistant(s): Nurse Practitioner: Clint Lipps, NP-C     Anesthesia Provider: Anesthesiologist: Lorrine Kin, MD    Anesthesia Type: General    Height & Weight:157.5 cm (62) & 74.6 kg (164 lb 8 oz)     Specimens: none           Implants:none    Antibiotics (admin):   Recent Abx Admin                   gentamicin 372.4 mg in sodium chloride 0.9 % (NS) 100 mL IVPB (mg) 372 mg New Bag 06/27/17 0804    clindamycin (CLEOCIN) 900 mg in D5W IVPB Premix (mg) 900 mg Given 06/27/17 0802                Incision Time:   Anes Incision Time     Date Time Event    06/27/2017 0813 Incision / Procedure Start           Timeout Completed:  Timeouts     Clydene Fake, RN at [facility] Jun 27, 2017 1610     Timeout Details     Timeout type:  Pre-incision                Clydene Fake, RN at [facility] Jun 27, 2017 9604     Timeout Details     Timeout type:  Sign-out                       Estimated Blood Loss: * 20 mLs - 06/27/2017  8:13 AM to 06/27/2017  8:56 AM *    Urethral Catheter:   Urethral Catheter Latex 16 Fr. (Active)   Placement Date: 06/27/17   Inserted by: Dr. Florian Buff  Sterile Technique: Yes  Catheter Type: Latex  Tube Size (Fr.): 16 Fr.  Catheter Balloon Size: 10 mL  Collection Container: Standard drainage bag  Urine Returned: Yes  Tolerance: Good   Number of days: 0       Findings: Pericecal adhesions.  Moderately extensive pelvic endometriosis.  periovarian adhesions.  Normal appendix.    Complications: None     Disposition: PACU - hemodynamically stable.    Rolene Arbour

## 2017-06-27 NOTE — Discharge Instructions (Signed)
Outpatient Surgery, Adult, Care After  These instructions provide you with information about caring for yourself after your procedure. Your health care provider may also give you more specific instructions. Your treatment has been planned according to current medical practices, but problems sometimes occur. Call your health care provider if you have any problems or questions after your procedure.  What can I expect after the procedure?  After the procedure, it is common to have:  ? Tenderness and numbness at the surgical site.  ? Swelling and bruising around the surgical site.  ? Nausea.  Follow these instructions at home:  For at least 24 hours after the procedure:    ? Do not:  ? Participate in activities where you could fall or become injured.  ? Drive.  ? Use heavy machinery.  ? Drink alcohol.  ? Take sleeping pills or medicines that cause drowsiness.  ? Make important decisions or sign legal documents.  ? Take care of children on your own.  ? Rest.  Activity  ? Return to your normal activities as told by your health care provider. Ask your health care provider what activities are safe for you.  ? Do not lift anything that is heavier than 10 lb (4.5 kg), or the limit that your health care provider tells you, until your health care provider says it is okay.  ? Do not play contact sports until your health care provider says it is okay.  Incision care  ? Follow instructions from your health care provider about how to take care of an incision, if you have one. Make sure you:  ? Wash your hands with soap and water before you change your bandage (dressing). If soap and water are not available, use hand sanitizer.  ? Change your dressing as told by your health care provider.  ? Leave stitches (sutures), skin glue, or adhesive strips in place. These skin closures may need to stay in place for 2 weeks or longer. If adhesive strip edges start to loosen and curl up, you may trim the loose edges. Do not remove adhesive  strips completely unless your health care provider tells you to do that.  ? Check your incision area every day for signs of infection. Check for:  ? More redness, swelling, or pain.  ? More fluid or blood.  ? Warmth.  ? Pus or a bad smell.  Medicines  ? Take over-the-counter and prescription medicines only as told by your health care provider.  ? Do not drive or use heavy machinery while taking prescription pain medicines.  Eating and drinking  ? If you vomit:  ? Drink water, juice, or soup when you can drink without vomiting.  ? Make sure you have little or no nausea before eating solid foods.  ? Follow the diet recommended by your health care provider.  General instructions    ? Do not use any tobacco products, such as cigarettes, chewing tobacco, and e-cigarettes, for as long as possible.  ? If you smoke, do not smoke without supervision.  ? Keep all follow-up visits as told by your health care provider. This is important.  Contact a health care provider if:  ? You have more redness, swelling, or pain around your incision.  ? You have more fluid or blood coming from your incision.  ? Your incision feels warm to the touch.  ? You have pus or a bad smell coming from your incision.  ? You have a fever.  ? You  feel light-headed or you faint.  ? You develop a rash.  ? You keep feeling nauseous or keep vomiting.  ? You have very bad pain, even after taking the medicines your health care provider has prescribed or recommended.  ? You have constipation.  Get help right away if:  ? You have trouble breathing.  This information is not intended to replace advice given to you by your health care provider. Make sure you discuss any questions you have with your health care provider.  Document Released: 02/12/2016 Document Revised: 06/13/2016 Document Reviewed: 02/12/2016  Elsevier Interactive Patient Education ? 2018 [employer]       Patient Education   Laparoscopic Lysis of Adhesions, Care After  Refer to this sheet in the  next few weeks. These instructions provide you with information about caring for yourself after your procedure. Your health care provider may also give you more specific instructions. Your treatment has been planned according to current medical practices, but problems sometimes occur. Call your health care provider if you have any problems or questions after your procedure.  What can I expect after the procedure?  After your procedure, it is common to have some pain around the incision.  Follow these instructions at home:  ? Take medicines only as directed by your health care provider.  ? You may need to start by eating only a little at a time. Start with liquids. As your appetite improves, gradually return to eating solid foods.  ? Do not lift anything that is heavier than 10 lb (4.5 kg) for four weeks.  ? Return to your normal activities as directed by your health care provider. Ask your health care provider what activities are safe for you.  ? There are many different ways to close and cover an incision, including stitches (sutures), skin glue, and adhesive strips. Follow instructions from your health care provider about:  ? Incision care.  ? Bandage (dressing) changes and removal.  ? Incision closure removal.  ? Check your incisions every day for signs of infection. Watch for:  ? Redness, swelling, or pain.  ? Fluid , blood, or pus.  Contact a health care provider if:  ? You develop a fever or chills.  ? You have redness, swelling, or pain at the site of your incisions.  ? You have fluid, blood, or pus coming from your incisions.  ? There is a bad smell coming from your incisions or the dressing.  ? Your pain gets worse.  ? You have a cough.  ? You have nausea and vomiting that does not go away after 3 hours.  Get help right away if:  ? You develop severe pain in your abdomen or your chest.  ? You develop shortness of breath.  ? You develop nausea or vomiting that is severe or keeps coming back.  This  information is not intended to replace advice given to you by your health care provider. Make sure you discuss any questions you have with your health care provider.  Document Released: 03/08/2015 Document Revised: 03/29/2016 Document Reviewed: 10/18/2014  Elsevier Interactive Patient Education ? 2018 [employer]

## 2017-06-27 NOTE — Anesthesia Post-Procedure Evaluation (Signed)
Patient Name: Kerry Allen  Procedures performed: Procedure(s):  LAPAROSCOPY;  ADHESIOLYSIS; ARGON BEAM TREATMENT OF EXTENSIVE ENDOMETRIOSIS    Last Vitals:        06/27/17 0940   BP: 110/66          06/27/17 0940   Pulse: 59          06/27/17 0940   Resp: 14          06/27/17 0940   SpO2: 97%          06/27/17 0905   Temp: 36.1 ?C       Planned Anesthesia Type: general  Final Anesthesia Type: general  Patients Current Location: PACU  Level of Consciousness: awake, responds appropriately and alert  Post Procedure Pain:adequate analgesia  Airway: Patent  Respiratory Status: room air  Cardio Status: hemodynamically stable  Hydration: adequately hydrated  PONV? NO  no anesthesia complication,       The patient was able to participate in the post op evaluation    Comments:      Lorrine Kin

## 2017-06-27 NOTE — OR Nursing (Signed)
Patient AOx4. Vital signs stable. Tolerating prescribed diet and denies nausea.Pain well controlled . Discharge instructions and medications reviewed with patient and family and a hard copy provided. Patient verbalizes understanding. All questions have been answered.

## 2017-07-05 DIAGNOSIS — M79601 Pain in right arm: Secondary | ICD-10-CM | POA: Diagnosis not present

## 2017-07-05 DIAGNOSIS — M25511 Pain in right shoulder: Secondary | ICD-10-CM | POA: Diagnosis not present

## 2017-07-05 DIAGNOSIS — M542 Cervicalgia: Secondary | ICD-10-CM | POA: Diagnosis not present

## 2017-07-10 DIAGNOSIS — R1013 Epigastric pain: Secondary | ICD-10-CM | POA: Diagnosis not present

## 2017-07-12 DIAGNOSIS — M542 Cervicalgia: Secondary | ICD-10-CM | POA: Diagnosis not present

## 2017-07-12 DIAGNOSIS — M79601 Pain in right arm: Secondary | ICD-10-CM | POA: Diagnosis not present

## 2017-07-12 DIAGNOSIS — M25511 Pain in right shoulder: Secondary | ICD-10-CM | POA: Diagnosis not present

## 2017-07-16 DIAGNOSIS — M79601 Pain in right arm: Secondary | ICD-10-CM | POA: Diagnosis not present

## 2017-07-16 DIAGNOSIS — M25511 Pain in right shoulder: Secondary | ICD-10-CM | POA: Diagnosis not present

## 2017-07-16 DIAGNOSIS — M542 Cervicalgia: Secondary | ICD-10-CM | POA: Diagnosis not present

## 2017-07-18 DIAGNOSIS — M542 Cervicalgia: Secondary | ICD-10-CM | POA: Diagnosis not present

## 2017-07-18 DIAGNOSIS — M25511 Pain in right shoulder: Secondary | ICD-10-CM | POA: Diagnosis not present

## 2017-07-18 DIAGNOSIS — M79601 Pain in right arm: Secondary | ICD-10-CM | POA: Diagnosis not present

## 2017-07-22 DIAGNOSIS — M542 Cervicalgia: Secondary | ICD-10-CM | POA: Diagnosis not present

## 2017-07-22 DIAGNOSIS — M25511 Pain in right shoulder: Secondary | ICD-10-CM | POA: Diagnosis not present

## 2017-07-22 DIAGNOSIS — M79601 Pain in right arm: Secondary | ICD-10-CM | POA: Diagnosis not present

## 2017-07-26 ENCOUNTER — Ambulatory Visit: Admit: 2017-07-26 | Payer: BC Managed Care – PPO | Attending: Family | Primary: Family

## 2017-07-26 DIAGNOSIS — Z01419 Encounter for gynecological examination (general) (routine) without abnormal findings: Secondary | ICD-10-CM

## 2017-07-26 LAB — LIQUID-BASED PAP
Diagnostic History: ABNORMAL
Final Cytologic Interpretation: NEGATIVE

## 2017-07-26 LAB — GENITAL CULTURE
Genital culture: 1
Gram Stain Result: NONE SEEN
Gram Stain Result: NONE SEEN
Gram Stain Result: NONE SEEN

## 2017-07-26 NOTE — Patient Instructions (Addendum)
Will plan to check bloodwork ordered today. These are fasting labs, so do not eat or drink anything but water for 12 hours prior to blood draw. Also, if you are taking vitamins, please do not take them 24 hours prior to getting blood drawn.

## 2017-07-26 NOTE — Progress Notes (Signed)
Kerry Allen is a 41 y.o. female seen today for Well Woman Exam  .    Patient is alone today in clinic. The patient is the medical decision maker today.    SUBJECTIVE:  HPI   OB/GYN:  G2/P2.  Patient is currently  sexually active with husband, patient has had uterine ablation.  Last PAP was completed 05/30/2015, results abnormal. Patient does not have periods.  Last Mammogram completed 05/17/2017, results normal.  Denies familial history of breast, cervical or endometrial/uterine CA. Endorses familial history of ovarian cancer - cousin. Patient had ablation due to precancerous cells.     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 The chaperone present was Mattie Marlin, [facility]     Lab Results  Pt here to discuss the following lab results as listed below. Pt educated regarding the findings and treatment plan developed.       Recent Results (from the past 4032 hour(s))   Thyroid Stimulating Immunoglobulin -Routine    Collection Time: 03/13/17  7:04 AM   Result Value Ref Range    Thyroid stimulating immunoglobulin <1.0 <=1.3 TSI index   Thyroid Stimulating Hormone -Routine    Collection Time: 03/13/17  7:04 AM   Result Value Ref Range    TSH - Thyroid Stimulating Hormone 0.70 0.45 - 5.33 ?IU/mL   T4, Free -Routine    Collection Time: 03/13/17  7:04 AM   Result Value Ref Range    T4, Free 1.3 (H) 0.6 - 1.2 ng/dL   16-XW Vitamin D Total D2+D3 -Routine    Collection Time: 04/26/17  6:57 AM      Result Value Ref Range    Vitamin D 25-OH Total 33.2 30.0 - 100.0 ng/mL   CBC with Auto Differential -Routine    Collection Time: 04/26/17  6:57 AM   Result Value Ref Range    WBC 6.6 4.8 - 10.8 10*3/?L    RBC 4.72 4.20 - 5.40 10*6/?L    Hemoglobin 13.6 12.0 - 16.0 g/dL    HCT 96.0 45.4 - 09.8 %    MCV 88.3 81.0 - 99.0 fL    MCH 28.9 27.0 - 34.0 pg    MCHC 32.7 32.0 - 36.0 g/dL    RDW 11.9 14.7 - 82.9 %    Platelet Count 353 150 - 400 10*3/?L    MPV 9.9 7.4 - 10.4 fL    Neutrophils % 65 35 - 70 %    Lymphocytes % 23 (L) 25 - 45 %    Monocytes % 6 0 - 12 %    Eosinophils % 5 0 - 7 %    Basophils % 1 0 - 2 %    Neutrophils, Absolute 4.3 1.6 - 7.3 10*3/?L    Lymphocytes, Absolute 1.5 1.1 - 4.3 10*3/?L    Monocytes, Absolute 0.4 0.0 - 1.2 10*3/?L    Eosinophils, Absolute 0.3 0.0 - 0.7 10*3/?L    Basophils, Absolute 0.1 0.0 - 0.2 10*3/?L  Differential Type Automated Differential    Comprehensive Metabolic Panel -Routine    Collection Time: 04/26/17  6:57 AM   Result Value Ref Range    Sodium 140 135 - 143 mmol/L    Potassium 4.2 3.5 - 5.1 mmol/L    Chloride 105 98 - 111 mmol/L    CO2 - Carbon Dioxide 27 21 - 31 mmol/L    Glucose 95 80 - 99 mg/dL    BUN 16 6 - 23 mg/dL    Creatinine 1.61 0.96 - 1.10 mg/dL    Calcium 9.0 8.6 - 04.5 mg/dL    AST - Aspartate Aminotransferase 10 8 - 39 IU/L    ALT - Alanine Aminotransferase 10 7 - 52 IU/L    Alkaline Phosphatase 66 34 - 104 IU/L    Bilirubin Total 0.5 0.3 - 1.2 mg/dL    Protein Total 7.0 6.0 - 8.0 g/dL    Albumin 4.1 3.5 - 5.0 g/dL    Globulin 2.9 2.2 - 3.7 g/dL    Albumin/Globulin Ratio 1.4 >0.9    Anion Gap 8 4 - 13 mmol/L    GFR Estimate >60 >=60 mL/min/1.34m*2    GFR Additional Info     Coronary Risk Lipid Panel w/reflex Direct LDL if triglycerides >400 mg/dL -Routine    Collection Time: 04/26/17  6:57 AM   Result Value Ref Range    Cholesterol 167 <200 mg/dL    Cholesterol, HDL 40 >=40 mg/dL    Triglyceride 77 30 - 149 mg/dL    LDL Calculated 409 (H) <100 mg/dL    Vitamin W11/BJYNWG -Routine    Collection Time: 04/26/17  6:57 AM   Result Value Ref Range    Vitamin B12 529 150 - 840 pg/mL    Folate 8.9 5.9 - 24.8 ng/mL   Vitamin B6 -Routine    Collection Time: 04/26/17  6:57 AM   Result Value Ref Range    Vitamin B6 (Pyridoxal 5-Phosphate) 7 5 - 50 mcg/L   CRP, High-Sensitivity -Routine    Collection Time: 04/26/17  6:57 AM   Result Value Ref Range    C-Reactive Protein High Sensitivity 12.58 (H) <=3.00 mg/L   Homocysteine, Total -Routine    Collection Time: 04/26/17  6:57 AM   Result Value Ref Range    Homocysteine 8.4 <=13.0 ?mol/L   T4, Free -Routine    Collection Time: 06/07/17  9:18 AM   Result Value Ref Range    T4, Free 1.0 0.6 - 1.2 ng/dL   Thyroid Stimulating Hormone -Routine    Collection Time: 06/07/17  9:18 AM   Result Value Ref Range    TSH - Thyroid Stimulating Hormone 0.79 0.45 - 5.33 ?IU/mL   Pregnancy, urine -STAT    Collection Time: 06/27/17  6:28 AM   Result Value Ref Range    hCG, Qualitative Negative Negative    Specific Gravity, Urine 1.014 1.003 - 1.030   Liquid-Based PAP -Routine    Collection Time: 07/26/17 12:55 PM   Result Value Ref Range    Pathology Report       Cytology PAP Report                               Case: [id number]                                   Authorizing Provider:  [name], FNP       Collected:           07/26/2017 1255              Ordering Location:     [facility]  Received:            07/29/2017 0905                                     [facility] [place]                                                   First Screen:          Clydell Hakim. Ritchie, CT(ASCP)                                                   Pathologist:           Dede Query, MD                                                             Specimen:    CERVICAL/ENDOCERVICAL,LIQUID BASED/SUREPATH VIAL, Cervical/Endocervical                    Final Cytologic Interpretation NEGATIVE FOR INTRAEPITHELIAL LESION OR MALIGNANCY     Adequacy        SATISFACTORY FOR EVALUATION: Endocervical component present    Clinical History Not given     Diagnostic History previous abnormal 05/30/2015     Hormone Therapy? No     IUD? No     Ancillary HPV Testing HPV REFLEX (for ASC-US in pt. 21-29)     Ancillary Chlamydia/GC Testing No     AP Embedded Document                                                                Genital culture    Collection Time: 07/26/17 12:55 PM   Result Value Ref Range    Genital culture 1 + Normal Vaginal Flora     Gram Stain Result Rare Polymorphonuclear cells     Gram Stain Result No Red Blood Cells seen     Gram Stain Result No Clue Cells Seen     Gram Stain Result No yeast seen     Gram Stain Result       Morphotypes Not Consistent with Bacterial Vaginosis   25-OH Vitamin D Total D2+D3 -Routine    Collection Time: 07/29/17  6:53 AM   Result Value Ref Range    Vitamin D 25-OH Total 34.2 30.0 - 100.0 ng/mL   CRP, High-Sensitivity -Routine    Collection Time: 07/29/17  6:53 AM  Result Value Ref Range    C-Reactive Protein High Sensitivity 12.53 (H) <=3.00 mg/L   Vitamin B12/Folate -Routine    Collection Time: 07/29/17  6:53 AM   Result Value Ref Range    Vitamin B12 547 150 - 840 pg/mL    Folate >20.0 5.9 - 24.8 ng/mL   Vitamin B2 -Routine    Collection Time: 07/29/17  6:53 AM   Result Value Ref Range    Vitamin B2 6 1 - 19 mcg/L   Vitamin B6 -Routine    Collection Time: 07/29/17  6:53 AM   Result Value Ref Range    Vitamin B6 (Pyridoxal 5-Phosphate) 18 5 - 50 mcg/L   Progesterone Panel -Routine    Collection Time: 07/29/17  6:53 AM   Result Value Ref Range    Progesterone 11.3 ng/mL   Estrone, Estradiol, and Estriol -Routine    Collection Time: 07/29/17  6:53 AM   Result Value Ref Range    Estrone 71 pg/mL    Estradiol Fraction 85 pg/mL    Estriol <0.07 <0.08 ng/mL          The following portions of the patient's history were reviewed and updated as appropriate: allergies, current medications, past family history, past medical history,  past surgical history and problem list.    MEDICATIONS:  Previous Medications    B-COMPLEX VITAMIN TABLET    Take 1 tablet by mouth daily. B-Right; [employer]    CHOLECALCIFEROL, VITAMIN D3, (VITAMIN D3) 5,000 UNIT TAB    Take by mouth.    IBU 600 MG TABLET    Take 600 mg by mouth every 6 hours as needed. for pain    INOSITOL, BULK, MISC    by Miscellaneous route.    LEVOMEFOLATE CALCIUM (L-METHYLFOLATE ORAL)    Take 5 mg by mouth.    LEVOTHYROXINE (SYNTHROID, LEVOTHROID) 125 MCG TABLET    Take 1 tablet by mouth daily.    LIOTHYRONINE (CYTOMEL) 5 MCG TABLET    Take 1 tablet by mouth daily.    MULTIVIT-MIN/FOLIC ACID/BIOTIN (HAIR,SKIN AND NAILS,FA-BIOTIN, ORAL)    Take 2 tablets by mouth daily.       ALLERGIES:  Allergies   Allergen Reactions   . Penicillins Other (See Comments)       PAST HISTORY:  Past Medical History:   Diagnosis Date   . hx of Graves' disease 2002    s/p I131  in 2002   . Postablative hypothyroidism     Hypothyroidism-Dr. Leonia Reader 2002   . Vitamin D deficiency      Past Surgical History:   Procedure Laterality Date   . CERVICAL BIOPSY     . PANNICULECTOMY  2005    tummy tuck   . PROCEDURE Right 06/27/2017    Procedure: LAPAROSCOPY;  ADHESIOLYSIS; ARGON BEAM TREATMENT OF EXTENSIVE ENDOMETRIOSIS;  Surgeon: Rolene Arbour, MD;  Location: [facility];  Service: Gynecology;  Laterality: Right;   . TUBAL LIGATION      Tubal ligation 2008     Family History   Problem Relation Age of Onset   . Diabetes Mother    . Cancer Mother    . Heart attack Father    . Breast cancer Neg Hx    . Ovarian cancer Neg Hx      Social History     Social History Main Topics   . Smoking status: Passive Smoke Exposure - Never Smoker   . Smokeless tobacco: Never Used      Comment:  passive smoke exposure   . Alcohol use 0.0 oz/week      Comment: occasional    . Drug use: No   . Sexual activity: Yes     Partners: Male     Birth control/ protection: Surgical      Comment: Tubal Ligation     Social History   . Marital status: Married      Spouse name: Jerrod   . Number of children: 2   . Years of education: N/A     Other Topics Concern   . Caffeine Concern Yes     moderate   . Exercise Yes     occasional        Review of Systems   Constitutional: Negative for chills and fever.   HENT: Negative for ear pain, facial swelling, sinus pressure and trouble swallowing.    Eyes: Negative for pain, discharge and redness.   Respiratory: Negative for cough, shortness of breath and wheezing.    Cardiovascular: Negative for chest pain and palpitations.   Gastrointestinal: Negative for abdominal pain, blood in stool, diarrhea and vomiting.   Genitourinary: Negative for hematuria, pelvic pain and vaginal bleeding.   Musculoskeletal: Negative for gait problem and joint swelling.   Skin: Negative for rash and wound.   Neurological: Negative for dizziness, seizures, syncope and light-headedness.   Psychiatric/Behavioral: Negative for confusion and hallucinations.       OBJECTIVE:  Vitals:    07/26/17 1159   BP: 112/66   BP Location: Left arm   Patient Position: Sitting   Pulse: 86   SpO2: 99%   Weight: 162 lb (73.5 kg)   Height: 5' 2.6 (1.59 m)     Body mass index is 29.07 kg/m?Marland Kitchen  No LMP recorded. Patient has had an ablation.    Physical Exam   Constitutional: She appears well-developed and well-nourished. She is cooperative. No distress.   HENT:   Head: Normocephalic and atraumatic.   Right Ear: Hearing, tympanic membrane, external ear and ear canal normal.   Left Ear: Hearing, tympanic membrane, external ear and ear canal normal.   Nose: Nose normal.   Mouth/Throat: Uvula is midline, oropharynx is clear and moist and mucous membranes are normal. No oral lesions. Normal dentition. No dental abscesses or dental caries. No oropharyngeal exudate.   Mucus membranes unremarkable   Eyes: Pupils are equal, round, and reactive to light. Conjunctivae, EOM and lids are normal. Right eye exhibits no discharge. Left eye exhibits no discharge. No scleral icterus.   Neck: Normal  range of motion and phonation normal. Neck supple. No thyromegaly present.   Cardiovascular: Normal rate, regular rhythm, S1 normal, S2 normal and normal pulses.   No extrasystoles are present.   Pulmonary/Chest: Effort normal and breath sounds normal. No respiratory distress. She has no wheezes. She has no rhonchi. She exhibits no tenderness.   BREAST EXAM:  (L) Breast: Fibrocystic Changes  (R) Breast: Fibrocystic Changes   Abdominal: Soft. Bowel sounds are normal. There is no tenderness.   Genitourinary: Vagina normal and uterus normal. Rectal exam shows no external hemorrhoid. Cervix exhibits no motion tenderness, no discharge and no friability. No erythema in the vagina. No vaginal discharge found.   Genitourinary Comments: Normal labia and hair distribution; pelvic support normal.   Musculoskeletal: Normal range of motion. She exhibits no edema or tenderness.   normal gait and station   Lymphadenopathy:     She has no cervical adenopathy.     She has no axillary  adenopathy.   Skin: Skin is warm and dry. No rash noted. She is not diaphoretic. No erythema.   Psychiatric:   APPEARANCE: Appropriate dress, No abnormal facial dysmorphisms  BEHAVIOR: Cooperative, Intermittent eye contact, normal psychomotor activity  SPEECH: normal rate and amplitude  MOOD: pleasant, affable  AFFECT: Congruent with mood  THOUGHT PROCESS: Goal directed, organized, logical, linear  THOUGHT Content: No abnormal content, no SI or HI  INSIGHT/JUDGMENT: Intact  CONCIOUSNESS: Awake, Alert  ORIENTATION: Oriented x 4  RECENT MEMORY: intact  REMOTE MEMORY: intact  ATTENTION/CONCENTRATION: intact  FUND OF KNOWLEDGE: Average       ASSESSMENT & PLAN:    ICD-10-CM    1. Well woman exam with routine gynecological exam Z01.419 Liquid-Based PAP -Routine     Genital culture     Liquid-Based PAP -Routine     Genital culture     Progesterone Panel -Routine     Estrone, Estradiol, and Estriol -Routine   2. Avitaminosis E56.9 Vitamin B12/Folate -Routine      Vitamin B2 -Routine     Vitamin B6 -Routine   3. Vitamin D deficiency E55.9 25-OH Vitamin D Total D2+D3 -Routine   4. Elevated C-reactive protein (CRP) R79.82 CRP, High-Sensitivity -Routine        Diagnostic tests above were reviewed and pt's questions were addressed.     Greater than 50% of 15 min session was spent on counseling and/or coordination of care. Patient/Caregiver is aware of how to contact clinical provider if concerns arise.    The patient verbalized understanding of the treatment plan and is in agreement with the treatment plan goals.     Patient Instructions   Will plan to check bloodwork ordered today. These are fasting labs, so do not eat or drink anything but water for 12 hours prior to blood draw. Also, if you are taking vitamins, please do not take them 24 hours prior to getting blood drawn.          Medication List          Accurate as of 07/26/17 11:59 PM. If you have any questions, ask your nurse or doctor.               CONTINUE taking these medications    B-complex vitamin tablet     HAIR,SKIN AND NAILS(FA-BIOTIN) ORAL     IBU 600 MG tablet  Generic drug:  ibuprofen     INOSITOL (BULK) MISC     L-METHYLFOLATE ORAL     levothyroxine 125 MCG tablet  Commonly known as:  SYNTHROID, LEVOTHROID  Take 1 tablet by mouth daily.     liothyronine 5 MCG tablet  Commonly known as:  CYTOMEL  Take 1 tablet by mouth daily.     VITAMIN D3 5,000 unit Tab  Generic drug:  cholecalciferol (vitamin D3)          FOLLOW-UP:     Future Appointments  Date Time Provider Department Center   08/23/2017 9:00 AM [name], FNP [id number]   10/11/2017 10:20 AM [name], FNP [id number]       I had a thoughtful and careful discussion regarding the issues today. All questions were answered. I educated and reassured. I encouraged followup as needed. Hand written instructions were given.    This note was transcribed using speech recognition software. Please contact us for clarification if any  questions arise relating to the wording of this document.

## 2017-07-29 ENCOUNTER — Other Ambulatory Visit: Admit: 2017-07-29 | Discharge: 2017-07-29 | Payer: BC Managed Care – PPO | Primary: Family

## 2017-07-29 DIAGNOSIS — M25511 Pain in right shoulder: Secondary | ICD-10-CM | POA: Diagnosis not present

## 2017-07-29 DIAGNOSIS — M79601 Pain in right arm: Secondary | ICD-10-CM | POA: Diagnosis not present

## 2017-07-29 DIAGNOSIS — M542 Cervicalgia: Secondary | ICD-10-CM | POA: Diagnosis not present

## 2017-07-29 DIAGNOSIS — E559 Vitamin D deficiency, unspecified: Secondary | ICD-10-CM

## 2017-07-29 LAB — C-REACTIVE PROTEIN (HIGH SENSITIVITY): C-Reactive Protein High Sensitivity: 12.53 mg/L — ABNORMAL HIGH (ref <=3.00)

## 2017-07-29 LAB — ESTROGEN PROFILE E1, E2, E3
Estradiol Fraction: 85 pg/mL
Estriol: <0.07 ng/mL (ref <0.08)
Estrone: 71 pg/mL

## 2017-07-29 LAB — VITAMIN B12/FOLATE
Folate: >20 ng/mL (ref 5.9–24.8)
Vitamin B12: 547 pg/mL (ref 150–840)

## 2017-07-29 LAB — PROGESTERONE: Progesterone: 11.3 ng/mL

## 2017-07-29 LAB — VITAMIN B6: Vitamin B6 (Pyridoxal 5-Phosphate): 18 mcg/L (ref 5–50)

## 2017-07-29 LAB — 25-OH HYDROXY VITAMIN D, CALCIFEROL, TOTAL OF D2 & D3: Vitamin D, 25-OH Total: 34.2 ng/mL (ref 30.0–100.0)

## 2017-07-29 LAB — VITAMIN B2: Vitamin B2: 6 mcg/L (ref 1–19)

## 2017-08-01 DIAGNOSIS — M542 Cervicalgia: Secondary | ICD-10-CM | POA: Diagnosis not present

## 2017-08-01 DIAGNOSIS — M79601 Pain in right arm: Secondary | ICD-10-CM | POA: Diagnosis not present

## 2017-08-01 DIAGNOSIS — M25511 Pain in right shoulder: Secondary | ICD-10-CM | POA: Diagnosis not present

## 2017-08-01 NOTE — Telephone Encounter (Signed)
-----   Message from [name], FNP sent at 07/31/2017  7:17 PM PDT -----  Need to discuss sooner than her appt already scheduled please.

## 2017-08-01 NOTE — Telephone Encounter (Signed)
Patient is out of town and will not be back until 08/09/2017.  Needs a Friday appointment.

## 2017-08-05 NOTE — Telephone Encounter (Signed)
Future Appointments  Date Time Provider Department Center   08/23/2017 9:00 AM [name], FNP [id number]   10/11/2017 10:20 AM [name], FNP [id number]     Mychart sent to patient.

## 2017-08-06 DIAGNOSIS — M79601 Pain in right arm: Secondary | ICD-10-CM | POA: Diagnosis not present

## 2017-08-06 DIAGNOSIS — M25511 Pain in right shoulder: Secondary | ICD-10-CM | POA: Diagnosis not present

## 2017-08-06 DIAGNOSIS — M542 Cervicalgia: Secondary | ICD-10-CM | POA: Diagnosis not present

## 2017-08-09 DIAGNOSIS — M542 Cervicalgia: Secondary | ICD-10-CM | POA: Diagnosis not present

## 2017-08-09 DIAGNOSIS — M25511 Pain in right shoulder: Secondary | ICD-10-CM | POA: Diagnosis not present

## 2017-08-09 DIAGNOSIS — M79601 Pain in right arm: Secondary | ICD-10-CM | POA: Diagnosis not present

## 2017-08-23 ENCOUNTER — Ambulatory Visit: Admit: 2017-08-23 | Discharge: 2017-09-30 | Payer: BC Managed Care – PPO | Attending: Family | Primary: Family

## 2017-08-23 DIAGNOSIS — E559 Vitamin D deficiency, unspecified: Secondary | ICD-10-CM

## 2017-08-23 NOTE — Progress Notes (Signed)
Immunizations     Name Date Dose VIS Date Route    Flu Quadrivalent Injectable (PF) 08/23/2017 0.5 mL 06/11/2014 Intramuscular    Site: Right deltoid    Given By: Mattie Marlin, MA    Documented By: Mattie Marlin, MA    Manufacturer: [employer]    Lot: [id number]    NDC: [id number]    Expiration Date: 05/04/2018

## 2017-08-23 NOTE — Patient Instructions (Addendum)
Take 2 of the [employer] brand b complex daily to improve your b-complex levels.     Recheck your B vitamin levels in 6 weeks.     Start on vitamin D3 supplementation as you were found to have a low vitamin D level below 60 (34.2). Would recommend starting on vitamin D3 5,000 IU 1 capsule daily and on Tues and Thurs, take 2 capsules.    Flu shot today.    Flonase, zyrtec and robitussin as needed for URI symptoms.

## 2017-08-23 NOTE — Progress Notes (Signed)
Cancelled lab orders, expected date wrong.

## 2017-08-23 NOTE — Progress Notes (Signed)
Kerry Allen is a 41 y.o. female seen today for Review Lab Results  .    SUBJECTIVE:  HPI  Lab Results  Pt here to discuss the following lab results as listed below. Pt educated regarding the findings and treatment plan developed.       Liquid-Based PAP -Routine    Collection Time: 07/26/17 12:55 PM   Result Value Ref Range    Pathology Report       Cytology PAP Report                               Case: [id number]                                   Authorizing Provider:  [name], FNP       Collected:           07/26/2017 1255              Ordering Location:     [facility]  Received:            07/29/2017 0905                                     [facility]                                                   First Screen:          Clydell Hakim. Ritchie, CT(ASCP)                                                   Pathologist:           Dede Query, MD                                                             Specimen:    CERVICAL/ENDOCERVICAL,LIQUID BASED/SUREPATH VIAL, Cervical/Endocervical                    Final Cytologic Interpretation NEGATIVE FOR INTRAEPITHELIAL LESION OR MALIGNANCY     Adequacy       SATISFACTORY FOR EVALUATION: Endocervical component present    Clinical History Not given     Diagnostic History previous abnormal 05/30/2015     Hormone Therapy? No     IUD? No     Ancillary HPV Testing HPV REFLEX (for ASC-US in pt. 21-29)     Ancillary Chlamydia/GC Testing No     AP Embedded Document  Genital culture    Collection Time: 07/26/17 12:55 PM   Result Value Ref Range    Genital culture 1 + Normal Vaginal Flora     Gram Stain Result Rare Polymorphonuclear cells     Gram Stain Result No Red Blood Cells seen     Gram Stain Result No Clue Cells Seen     Gram Stain Result No yeast seen     Gram Stain Result       Morphotypes Not Consistent with Bacterial Vaginosis   25-OH Vitamin D Total D2+D3 -Routine    Collection Time: 07/29/17  6:53 AM   Result Value Ref Range    Vitamin D 25-OH Total 34.2 30.0 - 100.0 ng/mL   CRP, High-Sensitivity -Routine    Collection Time: 07/29/17  6:53 AM   Result Value Ref Range    C-Reactive Protein High Sensitivity 12.53 (H) <=3.00 mg/L   Vitamin B12/Folate -Routine    Collection Time: 07/29/17  6:53 AM   Result Value Ref Range    Vitamin B12 547 150 - 840 pg/mL    Folate >20.0 5.9 - 24.8 ng/mL   Vitamin B2 -Routine    Collection Time: 07/29/17  6:53 AM   Result Value Ref Range    Vitamin B2 6 1 - 19 mcg/L   Vitamin B6 -Routine    Collection Time: 07/29/17  6:53 AM   Result Value Ref Range    Vitamin B6 (Pyridoxal 5-Phosphate) 18 5 - 50 mcg/L   Progesterone Panel -Routine    Collection Time: 07/29/17  6:53 AM   Result Value Ref Range    Progesterone 11.3 ng/mL   Estrone, Estradiol, and Estriol -Routine    Collection Time: 07/29/17  6:53 AM   Result Value Ref Range    Estrone 71 pg/mL    Estradiol Fraction 85 pg/mL    Estriol <0.07 <0.08 ng/mL        Flu vaccination  Pt is due for her flu vaccine. She is agreeable to the vaccine today. See scanned documents for details.    The following portions of the patient's history were reviewed and updated as appropriate: allergies, current medications, past family history, past medical history, past surgical history and problem list.    MEDICATIONS:  Previous Medications    B-COMPLEX VITAMIN TABLET    Take 1 tablet by mouth daily. B-Right; Jarrow Brand    CHOLECALCIFEROL, VITAMIN D3, (VITAMIN D3) 5,000 UNIT TAB    Take by mouth.    IBU 600 MG TABLET    Take 600 mg by mouth every 6 hours as needed. for pain    INOSITOL, BULK, MISC    by Miscellaneous route.    LEVOMEFOLATE CALCIUM (L-METHYLFOLATE ORAL)    Take 5 mg by mouth.    LEVOTHYROXINE (SYNTHROID, LEVOTHROID) 125 MCG TABLET    Take 1 tablet by mouth daily.    LIOTHYRONINE (CYTOMEL) 5 MCG TABLET    Take 1 tablet by mouth daily.    MULTIVIT-MIN/FOLIC ACID/BIOTIN (HAIR,SKIN AND NAILS,FA-BIOTIN,  ORAL)    Take 2 tablets by mouth daily.       ALLERGIES:  Allergies   Allergen Reactions   . Penicillins Other (See Comments)       PAST HISTORY:  Past Medical History:   Diagnosis Date   . Abnormal ultrasound 05/13/2017   . hx of Graves' disease 2002    s/p I131  in 2002   . Postablative hypothyroidism     Hypothyroidism-Dr. Leonia Reader 2002   .  Vitamin D deficiency      Past Surgical History:   Procedure Laterality Date   . CERVICAL BIOPSY     . PANNICULECTOMY  2005    tummy tuck   . PROCEDURE Right 06/27/2017    Procedure: LAPAROSCOPY;  ADHESIOLYSIS; ARGON BEAM TREATMENT OF EXTENSIVE ENDOMETRIOSIS;  Surgeon: Rolene Arbour, MD;  Location: [facility] OR;  Service: Gynecology;  Laterality: Right;   . TUBAL LIGATION      Tubal ligation 2008     Family History   Problem Relation Age of Onset   . Diabetes Mother    . Cancer Mother    . Heart attack Father    . Breast cancer Neg Hx    . Ovarian cancer Neg Hx      Social History     Social History Main Topics   . Smoking status: Passive Smoke Exposure - Never Smoker   . Smokeless tobacco: Never Used      Comment: passive smoke exposure   . Alcohol use 0.0 oz/week      Comment: occasional    . Drug use: No   . Sexual activity: Yes     Partners: Male     Birth control/ protection: Surgical      Comment: Tubal Ligation     Social History   . Marital status: Married     Spouse name: Jerrod   . Number of children: 2   . Years of education: N/A     Other Topics Concern   . Caffeine Concern Yes     moderate   . Exercise Yes     occasional        Review of Systems   Constitutional: Positive for fatigue. Negative for activity change and appetite change.   HENT: Negative for dental problem.    Respiratory: Negative for cough and shortness of breath.    Cardiovascular: Negative for chest pain.   Gastrointestinal: Negative for abdominal distention, abdominal pain, constipation, diarrhea, nausea and vomiting.   Skin: Negative for rash.       OBJECTIVE:  Vitals:    08/23/17 0903   BP: 114/68   BP  Location: Left arm   Patient Position: Sitting   Pulse: 94   Resp: 16   Temp: 36.7 ?C (98 ?F)   TempSrc: Oral   SpO2: 98%   Weight: 164 lb 9.6 oz (74.7 kg)   Height: 5' 2 (1.575 m)     Body mass index is 30.11 kg/m?Marland Kitchen  No LMP recorded. Patient has had an ablation.    Physical Exam   Constitutional: She is oriented to person, place, and time. She appears well-developed and well-nourished. No distress.   Cardiovascular: Normal rate and regular rhythm.    Pulmonary/Chest: Effort normal and breath sounds normal.   Musculoskeletal: Normal range of motion.   Neurological: She is alert and oriented to person, place, and time.   Skin: Skin is warm and dry.       ASSESSMENT & PLAN:    ICD-10-CM    1. Vitamin D deficiency E55.9 25-OH Vitamin D Total D2+D3 -Routine   2. Elevated C-reactive protein (CRP) R79.82    3. Avitaminosis E56.9 Vitamin B12/Folate -Routine     Vitamin B2 -Routine     Vitamin B6 -Routine   4. Need for vaccination Z23         Diagnostic tests above were reviewed and pt's questions were addressed.     Greater than 50% of 20 min session  was spent on counseling and/or coordination of care. Patient/Caregiver is aware of how to contact clinical provider if concerns arise.    Counseling provided to the patient/caregiver as outlined below. Addressed patient/caregiver concerns regarding current medication regimen including effectiveness, potential side effects, dosing range, duration, drug interaction, and adherence to medication/supplementation therapy. Addressed patient/caregiver concerns regarding diagnosis and prognosis, including accuracy of diagnosis, prognosis over time, impact of diagnosis on life functions, impact on family relationship, problematic behaviors secondary to diagnosis, and adequacy of current interventions. Addressed patient/caregiver concerns regarding  self-management including muscle relaxation, exercise, sleep hygiene, and maintenance of social rhythms. Addressed patient/caregivers  education deficits in regards to the value of nutrition in conjunction to medication management of symptoms.    The patient verbalized understanding of the treatment plan and is in agreement with the treatment plan goals.     Patient Instructions   Take 2 of the Jarrow brand b complex daily to improve your b-complex levels.     Recheck your B vitamin levels in 6 weeks.     Start on vitamin D3 supplementation as you were found to have a low vitamin D level below 60 (34.2). Would recommend starting on vitamin D3 5,000 IU 1 capsule daily and on Tues and Thurs, take 2 capsules.    Flu shot today.    Flonase, zyrtec and robitussin as needed for URI symptoms.       Medication List          Accurate as of 08/23/17 11:59 PM. If you have any questions, ask your nurse or doctor.               CONTINUE taking these medications    B-complex vitamin tablet     HAIR,SKIN AND NAILS(FA-BIOTIN) ORAL     IBU 600 MG tablet  Generic drug:  ibuprofen     INOSITOL (BULK) MISC     L-METHYLFOLATE ORAL     levothyroxine 125 MCG tablet  Commonly known as:  SYNTHROID, LEVOTHROID  Take 1 tablet by mouth daily.     liothyronine 5 MCG tablet  Commonly known as:  CYTOMEL  Take 1 tablet by mouth daily.     VITAMIN D3 5,000 unit Tab  Generic drug:  cholecalciferol (vitamin D3)          FOLLOW-UP:     Future Appointments  Date Time Provider Department Center   05/23/2018 9:40 AM [name], FNP [id number]     I had a thoughtful and careful discussion regarding the issues today. All questions were answered. I educated and reassured. I encouraged followup as needed. Hand written instructions were given.    This note was transcribed using speech recognition software. Please contact us for clarification if any questions arise relating to the wording of this document.

## 2017-08-23 NOTE — Addendum Note (Signed)
Addended by: Dereck Leep on: 02/14/2018 08:50 AM     Modules accepted: Orders

## 2017-09-16 DIAGNOSIS — M2011 Hallux valgus (acquired), right foot: Secondary | ICD-10-CM | POA: Diagnosis not present

## 2017-09-16 DIAGNOSIS — M2012 Hallux valgus (acquired), left foot: Secondary | ICD-10-CM | POA: Diagnosis not present

## 2017-09-19 DIAGNOSIS — M25511 Pain in right shoulder: Secondary | ICD-10-CM | POA: Diagnosis not present

## 2017-09-19 DIAGNOSIS — M542 Cervicalgia: Secondary | ICD-10-CM | POA: Diagnosis not present

## 2017-09-19 DIAGNOSIS — M79601 Pain in right arm: Secondary | ICD-10-CM | POA: Diagnosis not present

## 2017-09-28 DIAGNOSIS — J019 Acute sinusitis, unspecified: Secondary | ICD-10-CM | POA: Diagnosis not present

## 2017-10-01 DIAGNOSIS — M2012 Hallux valgus (acquired), left foot: Secondary | ICD-10-CM | POA: Diagnosis not present

## 2017-10-01 DIAGNOSIS — M19071 Primary osteoarthritis, right ankle and foot: Secondary | ICD-10-CM | POA: Diagnosis not present

## 2017-10-01 DIAGNOSIS — M2011 Hallux valgus (acquired), right foot: Secondary | ICD-10-CM | POA: Diagnosis not present

## 2017-10-01 DIAGNOSIS — M19072 Primary osteoarthritis, left ankle and foot: Secondary | ICD-10-CM | POA: Diagnosis not present

## 2017-10-02 DIAGNOSIS — M76821 Posterior tibial tendinitis, right leg: Secondary | ICD-10-CM | POA: Diagnosis not present

## 2017-10-02 DIAGNOSIS — M76822 Posterior tibial tendinitis, left leg: Secondary | ICD-10-CM | POA: Diagnosis not present

## 2017-10-02 NOTE — Telephone Encounter (Signed)
From: [name]  To: [name], FNP  Sent: 10/02/2017 1:08 PM PST  Subject: Medication Question    I have seen the recall on the Levothroxine and liothyronine sodium. Should I be worried about taking them. Just double checking.    Thank you,

## 2017-10-07 DIAGNOSIS — R439 Unspecified disturbances of smell and taste: Secondary | ICD-10-CM | POA: Diagnosis not present

## 2017-10-11 ENCOUNTER — Encounter: Payer: BC Managed Care – PPO | Attending: Family | Primary: Family

## 2017-10-22 DIAGNOSIS — M542 Cervicalgia: Secondary | ICD-10-CM | POA: Diagnosis not present

## 2017-10-22 DIAGNOSIS — M79601 Pain in right arm: Secondary | ICD-10-CM | POA: Diagnosis not present

## 2017-10-22 DIAGNOSIS — M25511 Pain in right shoulder: Secondary | ICD-10-CM | POA: Diagnosis not present

## 2017-11-08 MED ORDER — lactated Ringers infusion
INTRAVENOUS | Status: DC
Start: 2017-11-08 — End: 2017-11-08
  Administered 2017-11-08: 16:00:00 via INTRAVENOUS

## 2017-11-08 MED ORDER — propofol (DIPRIVAN) injection
10 | INTRAVENOUS | Status: DC | PRN
Start: 2017-11-08 — End: 2017-11-08
  Administered 2017-11-08: 16:00:00 10 mg/mL via INTRAVENOUS

## 2017-11-08 NOTE — Discharge Instructions (Signed)
Patient Education     Colonoscopy, Adult, Care After  This sheet gives you information about how to care for yourself after your procedure. Your health care provider may also give you more specific instructions. If you have problems or questions, contact your health care provider.  What can I expect after the procedure?  After the procedure, it is common to have:  ? A small amount of blood in your stool for 24 hours after the procedure.  ? Some gas.  ? Mild abdominal cramping or bloating.  Follow these instructions at home:  General instructions    ? For the first 24 hours after the procedure:  ? Do not drive or use machinery.  ? Do not sign important documents.  ? Do not drink alcohol.  ? Do your regular daily activities at a slower pace than normal.  ? Eat soft, easy-to-digest foods.  ? Rest often.  ? Take over-the-counter or prescription medicines only as told by your health care provider.  ? It is up to you to get the results of your procedure. Ask your health care provider, or the department performing the procedure, when your results will be ready.  Relieving cramping and bloating  ? Try walking around when you have cramps or feel bloated.  ? Apply heat to your abdomen as told by your health care provider. Use a heat source that your health care provider recommends, such as a moist heat pack or a heating pad.  ? Place a towel between your skin and the heat source.  ? Leave the heat on for 20-30 minutes.  ? Remove the heat if your skin turns bright red. This is especially important if you are unable to feel pain, heat, or cold. You may have a greater risk of getting burned.  Eating and drinking  ? Drink enough fluid to keep your urine clear or pale yellow.  ? Resume your normal diet as instructed by your health care provider. Avoid heavy or fried foods that are hard to digest.  ? Avoid drinking alcohol for as long as instructed by your health care provider.  Contact a health care provider if:  ? You have blood  in your stool 2-3 days after the procedure.  Get help right away if:  ? You have more than a small spotting of blood in your stool.  ? You pass large blood clots in your stool.  ? Your abdomen is swollen.  ? You have nausea or vomiting.  ? You have a fever.  ? You have increasing abdominal pain that is not relieved with medicine.  This information is not intended to replace advice given to you by your health care provider. Make sure you discuss any questions you have with your health care provider.  Document Released: 06/05/2004 Document Revised: 07/16/2016 Document Reviewed: 01/03/2016  Elsevier Interactive Patient Education ? 2018 [employer]

## 2017-11-08 NOTE — Procedures (Signed)
[facility]  Endoscopy Report  Patient Name: Kerry Allen  MRN: [id number]  Date of Birth: 06-20-1976  Attending MD: Olin Pia ,   Procedure Date No Time: 11/08/2017  Age: 42  Procedure:         Colonoscopy  Indications:       Abdominal pain in the right lower quadrant  Referring MD:        Medicines:         Propofol total dose 180 mg IV, Please refer to the [facility] in                      the patient Electronic Medical Record.  Complications:     No immediate complications.  Procedure:         After I obtained informed consent, the scope was passed                      under direct vision. Throughout the procedure, the                      patient's blood pressure, pulse, and oxygen saturations                      were monitored continuously. The Olympus Colonoscope (S/N                      [id number]) was introduced through the anus and advanced to                      the terminal ileum. The colonoscopy was performed without                      difficulty. The patient tolerated the procedure well. The                      quality of the bowel preparation was excellent.  Findings:       The perianal and digital rectal examinations were normal.       The terminal ileum appeared normal.       There is no endoscopic evidence of diverticula, mass, polyps or        ulcerations in the entire colon.       The entire examined colon appeared normal on direct and retroflexion        views.  Impression:        - The examined portion of the ileum was normal.                     - The entire examined colon is normal on direct and                      retroflexion views.                     - No specimens collected.  Recommendation:       - Return to GI clinic at appointment to be scheduled.       - If the pathology report indicates hyperplastic polyp, then repeat        colonoscopy for screening purposes in 10 years.  Moderate Sedation:       Moderate (conscious) sedation was administered by the  endoscopy nurse        and supervised by the  endoscopist. The following parameters were        monitored: oxygen saturation, heart rate, blood pressure, and response        to care. Total physician intraservice time was 10 minutes.  Marcia Brash D'Souza,   11/08/2017 8:03:23 AM  This report has been signed electronically.  Number of Addenda: 0  Estimated Blood Loss:       Estimated blood loss: none.       [facility]

## 2017-11-08 NOTE — OR Nursing (Signed)
Pt refused fluids.  Discharge instructions reviewed and a copy with photos was given to her

## 2017-11-08 NOTE — OR Nursing (Signed)
Dr DSouza spoke with pt regarding findings and plan of care.

## 2017-11-08 NOTE — Interval H&P Note (Signed)
I have assessed the patient.    No interval change in H&P.  ROS and PE unchanged.    [facility] Patient Class Confirmed.

## 2017-11-08 NOTE — Progress Notes (Signed)
Pt denies discomfort. No sig changes from pre assessment

## 2018-01-02 NOTE — Telephone Encounter (Signed)
From: [name]  To: [name], FNP  Sent: 01/01/2018 6:52 PM PST  Subject: Non-Urgent Medical Question    That would be great but I can only do Fridays. Could we do april 1st if possible.   ----- Message -----  From: [name]  Sent: 12/31/2017 2:28 PM PST  To: Kerry Apo. Fagin  Subject: RE: Non-Urgent Medical Question  Hi [name],  I spoke with [name] and she would like to see you in the office to discuss starting medication for weight loss. I scheduled you on 01/22/2018 at 11:20 am. Let me know if this appointment will work for you.    ----- Message -----   From: Kerry Allen   Sent: 12/30/2017 1:38 PM PST   To: [name], FNP  Subject: Non-Urgent Medical Question    [name],    I have a question and I thought I would just ask. Dr Osa Craver prescribed Phentermine 15 mg capsule awhile back and I only lost abt 7 pounds in 3 month. I would really like to try it again but I would like to up the dose if possible. I'm still not losing weight. I did ask dr Osa Craver but he referred me back to you. I understand if you dont think I should but I would really like to try again to see if it can help me lose weight.     Thank u

## 2018-01-08 NOTE — Telephone Encounter (Signed)
From: Kerry Allen  To: Kerry Sias, FNP  Sent: 01/08/2018 6:43 AM PST  Subject: Non-Urgent Medical Question    i could do the march 22 in the afternoon or march 29th any time.  ----- Message -----  From: Kerry Allen  Sent: 01/02/2018 9:08 AM PST  To: Kerry Allen  Subject: RE: Non-Urgent Medical Question  April 1st is a Monday and [name] is out of office. Is there another day     ----- Message -----   From: Kerry Allen   Sent: 01/01/2018 6:52 PM PST   To: Kerry Sias, FNP  Subject: Non-Urgent Medical Question    That would be great but I can only do Fridays. Could we do april 1st if possible.   ----- Message -----  From: Kerry Allen  Sent: 12/31/2017 2:28 PM PST  To: Kerry Allen  Subject: RE: Non-Urgent Medical Question  Hi [name],  I spoke with [name] and she would like to see you in the office to discuss starting medication for weight loss. I scheduled you on 01/22/2018 at 11:20 am. Let me know if this appointment will work for you.    ----- Message -----   From: Kerry Allen   Sent: 12/30/2017 1:38 PM PST   To: Kerry Sias, FNP  Subject: Non-Urgent Medical Question    [name],    I have a question and I thought I would just ask. Dr Osa Craver prescribed Phentermine 15 mg capsule awhile back and I only lost abt 7 pounds in 3 month. I would really like to try it again but I would like to up the dose if possible. I'm still not losing weight. I did ask dr Osa Craver but he referred me back to you. I understand if you dont think I should but I would really like to try again to see if it can help me lose weight.     Thank u

## 2018-01-22 ENCOUNTER — Encounter: Payer: BC Managed Care – PPO | Attending: Family | Primary: Family

## 2018-02-04 DIAGNOSIS — E538 Deficiency of other specified B group vitamins: Secondary | ICD-10-CM | POA: Diagnosis not present

## 2018-02-04 DIAGNOSIS — E039 Hypothyroidism, unspecified: Secondary | ICD-10-CM | POA: Diagnosis not present

## 2018-02-04 DIAGNOSIS — D509 Iron deficiency anemia, unspecified: Secondary | ICD-10-CM | POA: Diagnosis not present

## 2018-02-04 DIAGNOSIS — E559 Vitamin D deficiency, unspecified: Secondary | ICD-10-CM | POA: Diagnosis not present

## 2018-02-12 ENCOUNTER — Telehealth: Payer: Self-pay | Admitting: Hematology and Oncology

## 2018-02-12 ENCOUNTER — Other Ambulatory Visit: Payer: Self-pay | Admitting: Hematology and Oncology

## 2018-02-12 DIAGNOSIS — D508 Other iron deficiency anemias: Secondary | ICD-10-CM

## 2018-02-12 DIAGNOSIS — Z9884 Bariatric surgery status: Secondary | ICD-10-CM

## 2018-02-12 NOTE — Telephone Encounter (Signed)
Spoke to patient regarding upcoming may appointments per 4/10 sch message.

## 2018-02-14 ENCOUNTER — Ambulatory Visit: Admit: 2018-02-14 | Discharge: 2018-02-14 | Payer: BC Managed Care – PPO | Primary: Family

## 2018-02-21 ENCOUNTER — Ambulatory Visit: Admit: 2018-02-21 | Discharge: 2018-03-02 | Payer: BC Managed Care – PPO | Attending: Family | Primary: Family

## 2018-02-21 DIAGNOSIS — F988 Other specified behavioral and emotional disorders with onset usually occurring in childhood and adolescence: Secondary | ICD-10-CM

## 2018-02-21 MED ORDER — dextroamphetamine-amphetamine (ADDERALL) 10 mg tablet
10 | ORAL_TABLET | ORAL | 0 refills | Status: DC
Start: 2018-02-21 — End: 2018-03-21

## 2018-02-21 NOTE — Addendum Note (Signed)
Addended byMattie Marlin on: 03/11/2018 04:51 PM     Modules accepted: Orders, SmartSet

## 2018-02-21 NOTE — Progress Notes (Signed)
Kerry Allen is a 42 y.o. female seen today for Weight management  .    Provider in: 0920  Provider [phone]    Patient is alone. The Patient is the medical decision(s) maker attending today's appointment.    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 States several months ago Dr. Osa Craver put her on phentermine. States that she did see some efficacy with the medication. However, since being off the medication she has noticed that she is having problems controlling her eating habits again. Patient states that she has been increasing her protein intake, she is continuing to drink plenty of water per day, has been working out 3-4 days a week (mainly cardio).    Hypersomnia  Patient reports that she is tired a lot. Patient states this this is a chronic problem.     Epworth Sleepiness Scale     Sitting and reading:1  Watching TV: 2  Sitting inactive in a public place (for example a theater or a meeting): 2  As a passenger in a car for an hour without a break: 3  Lying down to rest in the afternoon when circumstances permit: 3  Sitting and talking to someone: 2  Sitting quietly after a lunch without alcohol: 3  In a car, while stopped for a few minutes in traffic: 0  Total Score: 16/24    STOP-BANG Sleep Apnea Questionnaire 02/21/2018   Do you SNORE loudly (louder than talking or loud enough to be heard through closed doors)? 1   Do you often feel TIRED, fatigued, or sleepy during daytime? 1   Has anyone OBSERVED you stop breathing during your sleep? 0   Do you have or are you being treated for high blood PRESSURE? 0   BMI more  than 35 kg/m2 0   AGE over 2 years old? 0   NECK circumference > 16 inches (40 cm)? 0   GENDER:  Female? 0   STOP BANG Total Score 2   OSA Risk 2 - Low risk of OSA       ALLERGIES:  Allergies   Allergen Reactions   . Penicillins Rash     When she was a child       Outpatient Medications Marked as Taking for the 02/21/18 encounter (Office Visit) with [name], FNP   Medication Sig Dispense Refill   . B-complex vitamin tablet Take 1 tablet by mouth daily. B-Right; Glo Herring     . cholecalciferol, vitamin D3, (VITAMIN D3) 5,000 unit Tab Take by mouth.     Laroy Apple, BULK, MISC by Miscellaneous route.     Marland Kitchen levomefolate calcium (L-METHYLFOLATE ORAL) Take 5 mg by mouth.     . levothyroxine (SYNTHROID, LEVOTHROID) 125 MCG tablet Take 1 tablet by mouth daily. 90 tablet 3   . liothyronine (CYTOMEL) 5 MCG tablet Take 1 tablet by mouth daily. 90 tablet 3   .  multivit-min/folic acid/biotin (HAIR,SKIN AND NAILS,FA-BIOTIN, ORAL) Take 2 tablets by mouth daily.         VITALS:  Vitals:    02/21/18 0915   BP: 128/72   BP Location: Left arm   Patient Position: Sitting   Pulse: 91   Resp: 16   Temp: 36.6 ?C (97.9 ?F)   TempSrc: Oral   SpO2: 98%   Weight: 171 lb (77.6 kg)   Height: 5' 2 (1.575 m)     Body mass index is 31.28 kg/m?Marland Kitchen  No LMP recorded. Patient has had an ablation.    Ideal body weight: 110 lb 7.2 oz (50.1 kg)  Adjusted ideal body weight: 134 lb 10.7 oz (61.1 kg)       Wt Readings from Last 3 Encounters:   02/21/18 171 lb (77.6 kg)   11/08/17 170 lb (77.1 kg)   08/23/17 164 lb 9.6 oz (74.7 kg)       Review of Systems   Constitutional: Positive for fatigue. Negative for activity change, appetite change and diaphoresis.   HENT: Negative for ear pain, postnasal drip, sinus pressure and sore throat.    Eyes: Negative for discharge, redness and visual disturbance.   Respiratory: Negative for cough, chest tightness, shortness of breath and wheezing.    Cardiovascular: Negative for chest pain and palpitations.      Genitourinary: Negative for dyspareunia, dysuria, menstrual problem, pelvic pain, vaginal bleeding, vaginal discharge and vaginal pain.   Musculoskeletal: Negative for arthralgias, gait problem, myalgias and neck pain.   Skin: Negative for color change and rash.   Neurological: Negative for dizziness, seizures and headaches.   Hematological: Negative for adenopathy. Does not bruise/bleed easily.   Psychiatric/Behavioral: Positive for decreased concentration, dysphoric mood and sleep disturbance. Negative for agitation. The patient is not nervous/anxious.        Also see HPI for ROS    Physical Exam   Constitutional: She is oriented to person, place, and time. She appears well-developed and well-nourished. No distress.   Obese   HENT:   Shallow palate. Otherwise unremarkable.   Eyes:   Unremarkable   Neck:   Unremarkable   Cardiovascular: Normal rate and regular rhythm.   Pulmonary/Chest: Effort normal and breath sounds normal.   Abdominal:   Deferred   Genitourinary:   Genitourinary Comments: Deferred   Musculoskeletal: Normal range of motion.   Neurological: She is alert and oriented to person, place, and time.   Skin: Skin is warm and dry.   Psychiatric: She has a normal mood and affect. Thought content normal. She expresses impulsivity.   ASRS screen is positive She is inattentive.       Items reviewed in preparation for this visit:  The following portions of the patient's history were reviewed and updated as appropriate: allergies, current medications, past family history, past medical history, past surgical history and problem list.  When necessary and appropriate, prior medical records, medical history from family members or other providers were reviewed for coordination of care.     LABWORK COMPLETED PRIOR TO THIS VISIT: N/A      RADIOLOGY STUDIES COMPLETED PRIOR TO THIS VISIT:  No results found.    Health Maintenance Due  There are no preventive care reminders to display for this patient.    Past Medical  History:   Diagnosis Date   . Abnormal ultrasound 05/13/2017   . hx of Graves' disease 2002    s/p I131  in 2002   . Postablative hypothyroidism  Hypothyroidism-Dr. Leonia Reader 2002   . Vitamin D deficiency      Past Surgical History:   Procedure Laterality Date   . ABDOMINAL SURGERY     . CERVICAL BIOPSY     . PANNICULECTOMY  2005    tummy tuck   . PROCEDURE Right 06/27/2017    Procedure: LAPAROSCOPY;  ADHESIOLYSIS; ARGON BEAM TREATMENT OF EXTENSIVE ENDOMETRIOSIS;  Surgeon: Rolene Arbour, MD;  Location: [facility] OR;  Service: Gynecology;  Laterality: Right;   . PROCEDURE N/A 11/08/2017    Procedure: COLONOSCOPY with sedation;  Surgeon: Olin Pia, MD;  Location: [facility] ENDO;  Service: Endoscopy;  Laterality: N/A;   . TUBAL LIGATION      Tubal ligation 2008     Family History   Problem Relation Age of Onset   . Diabetes Mother    . Cancer Mother    . Heart attack Father    . Breast cancer Neg Hx    . Ovarian cancer Neg Hx         Social History     Socioeconomic History   . Marital status: Married     Spouse name: Jerrod   . Number of children: 2   . Years of education: Not on file   . Highest education level: Not on file   Tobacco Use   . Smoking status: Never Smoker   . Smokeless tobacco: Never Used   . Tobacco comment: passive smoke exposure   Substance and Sexual Activity   . Alcohol use: Yes     Alcohol/week: 0.0 oz     Comment: occasional    . Drug use: No   . Sexual activity: Yes     Partners: Male     Birth control/protection: Surgical     Comment: Tubal Ligation   Other Topics Concern   . Caffeine Concern Yes     Comment: moderate   . Exercise Yes     Comment: occasional        I had a thoughtful and careful discussion regarding the issues today. All questions were answered. I educated and reassured. I encouraged followup as needed. Hand written instructions were given via the After Visit Summary including reference literature as appropriate. Today's medical decision maker  verbalize understanding of the treatment  plan and are in agreement with the treatment plan goals. Medical decision maker is aware of how to contact clinical provider if concerns arise.    Patient Instructions   Check EKG.     Start on adderall 10 mg at 8 AM and 1 tab between 12-2 PM. May start on 5 mg 3 times a day at first and see how you feel.       FOLLOW-UP:     Future Appointments   Date Time Provider Department Center   03/21/2018  9:30 AM [name], FNP [id number]   05/23/2018 10:00 AM [name], FNP [id number]       This note was transcribed using speech recognition software. Please contact us for clarification if any questions arise relating to the wording of this document.

## 2018-02-21 NOTE — Patient Instructions (Addendum)
Check EKG.     Start on adderall 10 mg at 8 AM and 1 tab between 12-2 PM. May start on 5 mg 3 times a day at first and see how you feel.

## 2018-02-21 NOTE — Progress Notes (Deleted)
Kerry Allen is a 42 y.o. female seen today for Weight management  .    Provider in: 0930  Provider exit:1000    Patient is alone. The Patient is the medical decision(s) maker attending today's appointment.    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 Patient states that she eats healthy. Patient states that she has not been able to lose weight.    Adult ADD  Pt with a positive ASRS screen. Pt has a long standing problem with distractibility/impulsivity.       ALLERGIES:  Allergies   Allergen Reactions   . Penicillins Rash     When she was a child       Outpatient Medications Marked as Taking for the 02/21/18 encounter (Office Visit) with [name], FNP   Medication Sig Dispense Refill   . B-complex vitamin tablet Take 1 tablet by mouth daily. B-Right; Glo Herring     . cholecalciferol, vitamin D3, (VITAMIN D3) 5,000 unit Tab Take by mouth.     Laroy Apple, BULK, MISC by Miscellaneous route.     Marland Kitchen levomefolate calcium (L-METHYLFOLATE ORAL) Take 5 mg by mouth.     . levothyroxine (SYNTHROID, LEVOTHROID) 125 MCG tablet Take 1 tablet by mouth daily. 90 tablet 3   . liothyronine (CYTOMEL) 5 MCG tablet Take 1 tablet by mouth daily. 90 tablet 3   . multivit-min/folic acid/biotin (HAIR,SKIN AND NAILS,FA-BIOTIN, ORAL) Take 2 tablets by mouth daily.         VITALS:  Vitals:    02/21/18 0915   BP: 128/72   BP Location: Left arm   Patient Position: Sitting   Pulse: 91   Resp: 16   Temp: 36.6 ?C (97.9 ?F)   TempSrc: Oral   SpO2: 98%   Weight: 171 lb (77.6 kg)   Height: 5' 2 (1.575 m)     Body mass index is 31.28 kg/m?Marland Kitchen  No LMP recorded. Patient has had an ablation.    Ideal body weight: 110 lb 7.2 oz (50.1 kg)  Adjusted ideal body weight: 134 lb 10.7 oz (61.1 kg)     Wt  Readings from Last 3 Encounters:   02/21/18 171 lb (77.6 kg)   11/08/17 170 lb (77.1 kg)   08/23/17 164 lb 9.6 oz (74.7 kg)       Review of Systems   Constitutional: Positive for fatigue. Negative for activity change, appetite change, chills and fever.   HENT: Negative for congestion, ear discharge, ear pain, rhinorrhea, sinus pressure, sinus pain, sneezing and sore throat.    Eyes: Negative for pain, itching and visual disturbance.   Respiratory: Negative for cough, chest tightness, shortness of breath and wheezing.    Cardiovascular: Positive for palpitations and leg swelling. Negative for chest pain.   Gastrointestinal: Negative for abdominal pain, constipation, diarrhea, nausea and vomiting.   Genitourinary: Negative for difficulty urinating, dysuria, flank pain, hematuria, pelvic pain, vaginal bleeding, vaginal discharge and vaginal pain.   Musculoskeletal: Negative for arthralgias, back pain, myalgias, neck pain and neck stiffness.   Skin: Negative for rash and wound.   Neurological: Positive for headaches. Negative for dizziness  and numbness.   Psychiatric/Behavioral: Positive for agitation, decreased concentration and sleep disturbance. Negative for hallucinations, self-injury and suicidal ideas. The patient is nervous/anxious.         Patient states that she is not sleeping through the night.       Also see HPI for ROS    Physical Exam    ***    Items reviewed in preparation for this visit:  The following portions of the patient's history were reviewed and updated as appropriate: allergies, current medications, past family history, past medical history, past surgical history and problem list.  When necessary and appropriate, prior medical records, medical history from family members or other providers were reviewed for coordination of care.     Specialty consultations since last visit: ***    LABWORK COMPLETED PRIOR TO THIS VISIT:  No visits with results within 3 Month(s) from this visit.   Latest known  visit with results is:   Lab Walk-In on 07/29/2017   Component Date Value Ref Range Status   . Vitamin D 25-OH Total 07/29/2017 34.2  30.0 - 100.0 ng/mL Final   . C-Reactive Protein High Sensitivity 07/29/2017 12.53* <=3.00 mg/L Final   . Vitamin B12 07/29/2017 547  150 - 840 pg/mL Final   . Folate 07/29/2017 >20.0  5.9 - 24.8 ng/mL Final   . Vitamin B2 07/29/2017 6  1 - 19 mcg/L Final   . Vitamin B6 (Pyridoxal 5-Phosphate) 07/29/2017 18  5 - 50 mcg/L Final   . Progesterone 07/29/2017 11.3  ng/mL Final   . Estrone 07/29/2017 71  pg/mL Final   . Estradiol Fraction 07/29/2017 85  pg/mL Final   . Estriol 07/29/2017 <0.07  <0.08 ng/mL Final       RADIOLOGY STUDIES COMPLETED PRIOR TO THIS VISIT:  No results found.    Health Maintenance Due  There are no preventive care reminders to display for this patient.    Past Medical History:   Diagnosis Date   . Abnormal ultrasound 05/13/2017   . hx of Graves' disease 2002    s/p I131  in 2002   . Postablative hypothyroidism     Hypothyroidism-Dr. Leonia Reader 2002   . Vitamin D deficiency      Past Surgical History:   Procedure Laterality Date   . ABDOMINAL SURGERY     . CERVICAL BIOPSY     . PANNICULECTOMY  2005    tummy tuck   . PROCEDURE Right 06/27/2017    Procedure: LAPAROSCOPY;  ADHESIOLYSIS; ARGON BEAM TREATMENT OF EXTENSIVE ENDOMETRIOSIS;  Surgeon: Rolene Arbour, MD;  Location: [facility] OR;  Service: Gynecology;  Laterality: Right;   . PROCEDURE N/A 11/08/2017    Procedure: COLONOSCOPY with sedation;  Surgeon: Olin Pia, MD;  Location: [facility] ENDO;  Service: Endoscopy;  Laterality: N/A;   . TUBAL LIGATION      Tubal ligation 2008     Family History   Problem Relation Age of Onset   . Diabetes Mother    . Cancer Mother    . Heart attack Father    . Breast cancer Neg Hx    . Ovarian cancer Neg Hx       Social History     Socioeconomic History   . Marital status: Married     Spouse name: Jerrod   . Number of children: 2   . Years of education: Not on file   . Highest education level:  Not on file   Tobacco Use   . Smoking  status: Never Smoker   . Smokeless tobacco: Never Used   . Tobacco comment: passive smoke exposure   Substance and Sexual Activity   . Alcohol use: Yes     Alcohol/week: 0.0 oz     Comment: occasional    . Drug use: No   . Sexual activity: Yes     Partners: Male     Birth control/protection: Surgical     Comment: Tubal Ligation   Other Topics Concern   . Caffeine Concern Yes     Comment: moderate   . Exercise Yes     Comment: occasional        I had a thoughtful and careful discussion regarding the issues today. All questions were answered. I educated and reassured. I encouraged followup as needed. Hand written instructions were given via the After Visit Summary including reference literature as appropriate. Today's medical decision maker  verbalize understanding of the treatment plan and {ARE/ARE NOT:21863} in agreement with the treatment plan goals. Medical decision maker is aware of how to contact clinical provider if concerns arise.    FOLLOW-UP:  No follow-ups on file.   Future Appointments   Date Time Provider Department Center   05/23/2018 10:00 AM [name], FNP [id number]       This note was transcribed using speech recognition software. Please contact us for clarification if any questions arise relating to the wording of this document.

## 2018-02-25 NOTE — Telephone Encounter (Signed)
Prior authorization for Adderall 10 mg approved by patient's insurance for dates 01/26/18 - 02/24/2021.

## 2018-02-25 NOTE — Telephone Encounter (Signed)
Prior Authorization submitted through CoverMyMeds for Adderall 10 mg.  Pending outcome from insurance.

## 2018-02-28 ENCOUNTER — Other Ambulatory Visit: Admit: 2018-02-28 | Discharge: 2018-02-28 | Payer: BC Managed Care – PPO | Primary: Family

## 2018-02-28 DIAGNOSIS — E569 Vitamin deficiency, unspecified: Secondary | ICD-10-CM

## 2018-02-28 LAB — VITAMIN B12/FOLATE
Folate: >20 ng/mL (ref 5.9–24.8)
Vitamin B12: 612 pg/mL (ref 150–840)

## 2018-02-28 LAB — 25-OH HYDROXY VITAMIN D, CALCIFEROL, TOTAL OF D2 & D3: Vitamin D, 25-OH Total: 58.4 ng/mL (ref 30.0–100.0)

## 2018-02-28 LAB — VITAMIN B2: Vitamin B2: 12 mcg/L (ref 1–19)

## 2018-02-28 LAB — VITAMIN B6: Vitamin B6 (Pyridoxal 5-Phosphate): 40 mcg/L (ref 5–50)

## 2018-03-07 ENCOUNTER — Inpatient Hospital Stay: Payer: 59 | Attending: Hematology and Oncology

## 2018-03-07 ENCOUNTER — Inpatient Hospital Stay (HOSPITAL_BASED_OUTPATIENT_CLINIC_OR_DEPARTMENT_OTHER): Payer: 59 | Admitting: Hematology and Oncology

## 2018-03-07 ENCOUNTER — Encounter: Payer: Self-pay | Admitting: Hematology and Oncology

## 2018-03-07 ENCOUNTER — Telehealth: Payer: Self-pay | Admitting: Hematology and Oncology

## 2018-03-07 ENCOUNTER — Inpatient Hospital Stay: Payer: 59

## 2018-03-07 VITALS — BP 115/62 | HR 72

## 2018-03-07 DIAGNOSIS — K909 Intestinal malabsorption, unspecified: Secondary | ICD-10-CM | POA: Insufficient documentation

## 2018-03-07 DIAGNOSIS — G2581 Restless legs syndrome: Secondary | ICD-10-CM | POA: Insufficient documentation

## 2018-03-07 DIAGNOSIS — Z79899 Other long term (current) drug therapy: Secondary | ICD-10-CM | POA: Diagnosis not present

## 2018-03-07 DIAGNOSIS — G6281 Critical illness polyneuropathy: Secondary | ICD-10-CM | POA: Diagnosis not present

## 2018-03-07 DIAGNOSIS — R5383 Other fatigue: Secondary | ICD-10-CM | POA: Diagnosis not present

## 2018-03-07 DIAGNOSIS — Z9884 Bariatric surgery status: Secondary | ICD-10-CM

## 2018-03-07 DIAGNOSIS — D508 Other iron deficiency anemias: Secondary | ICD-10-CM

## 2018-03-07 LAB — CBC WITH DIFFERENTIAL/PLATELET
BASOS ABS: 0 10*3/uL (ref 0.0–0.1)
BASOS PCT: 1 %
EOS ABS: 0.1 10*3/uL (ref 0.0–0.5)
Eosinophils Relative: 1 %
HCT: 44 % (ref 34.8–46.6)
Hemoglobin: 14.4 g/dL (ref 11.6–15.9)
Lymphocytes Relative: 22 %
Lymphs Abs: 1.3 10*3/uL (ref 0.9–3.3)
MCH: 30.8 pg (ref 25.1–34.0)
MCHC: 32.7 g/dL (ref 31.5–36.0)
MCV: 94.3 fL (ref 79.5–101.0)
MONO ABS: 0.4 10*3/uL (ref 0.1–0.9)
MONOS PCT: 7 %
NEUTROS ABS: 3.9 10*3/uL (ref 1.5–6.5)
NEUTROS PCT: 69 %
Platelets: 228 10*3/uL (ref 145–400)
RBC: 4.67 MIL/uL (ref 3.70–5.45)
RDW: 13.2 % (ref 11.2–14.5)
WBC: 5.7 10*3/uL (ref 3.9–10.3)

## 2018-03-07 LAB — IRON AND TIBC
IRON: 120 ug/dL (ref 41–142)
Saturation Ratios: 37 % (ref 21–57)
TIBC: 326 ug/dL (ref 236–444)
UIBC: 206 ug/dL

## 2018-03-07 LAB — FERRITIN: FERRITIN: 19 ng/mL (ref 9–269)

## 2018-03-07 MED ORDER — SODIUM CHLORIDE 0.9 % IV SOLN
510.0000 mg | Freq: Once | INTRAVENOUS | Status: AC
  Administered 2018-03-07: 510 mg via INTRAVENOUS
  Filled 2018-03-07: qty 17

## 2018-03-07 NOTE — Patient Instructions (Signed)

## 2018-03-07 NOTE — Assessment & Plan Note (Signed)
She has severe profound symptoms of iron deficiency even though she is not anemic The patient had gastric bypass surgery with mineral malabsorption She has tried oral supplement without success The most likely cause of her anemia is due to chronic blood loss/malabsorption syndrome. We discussed some of the risks, benefits, and alternatives of intravenous iron infusions. The patient is symptomatic from anemia and the iron level is critically low. She tolerated oral iron supplement poorly and desires to achieved higher levels of iron faster for adequate hematopoesis. Some of the side-effects to be expected including risks of infusion reactions, phlebitis, headaches, nausea and fatigue.  The patient is willing to proceed. Patient education material was dispensed.  Goal is to keep ferritin level greater than 50 She will get repeat blood work and iron studies to be done through her primary care office at the end of the year and she will call me if she have recurrent iron deficiency again She desire to get pregnant.  If she becomes pregnant, she is informed to call me for further intravenous iron infusion

## 2018-03-07 NOTE — Assessment & Plan Note (Signed)
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

## 2018-03-07 NOTE — Telephone Encounter (Signed)
Gave patient AVs and calendar of upcoming May appointments.  °

## 2018-03-07 NOTE — Progress Notes (Signed)
[name] OFFICE PROGRESS NOTE  Maurice Small, MD  ASSESSMENT & PLAN:  IDA (iron deficiency anemia) She has severe profound symptoms of iron deficiency even though she is not anemic The patient had gastric bypass surgery with mineral malabsorption She has tried oral supplement without success The most likely cause of her anemia is due to chronic blood loss/malabsorption syndrome. We discussed some of the risks, benefits, and alternatives of intravenous iron infusions. The patient is symptomatic from anemia and the iron level is critically low. She tolerated oral iron supplement poorly and desires to achieved higher levels of iron faster for adequate hematopoesis. Some of the side-effects to be expected including risks of infusion reactions, phlebitis, headaches, nausea and fatigue.  The patient is willing to proceed. Patient education material was dispensed.  Goal is to keep ferritin level greater than 50 She will get repeat blood work and iron studies to be done through her primary care office at the end of the year and she will call me if she have recurrent iron deficiency again She desire to get pregnant.  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   INTERVAL HISTORY: Margaret Miller 42 y.o. female returns for for further follow-up and for repeat intravenous iron infusion. Planes of excessive fatigue and restless leg Since the last time I saw her, she had recurrent miscarriage She desire to get pregnant again She has one baby at home The patient denies any recent signs or symptoms of bleeding such as spontaneous epistaxis, hematuria or  hematochezia. She denies excessive menorrhagia She has no infusion reaction from prior intravenous iron infusion  SUMMARY OF HEMATOLOGIC HISTORY:  She was found to have abnormal CBC from routine blood work monitoring She has recent 1st trimester miscarriage in August 2017. She has noted to be anemic. Recent blood work also showed severe iron deficiency with ferritin of 5 She denies recent chest pain on exertion, but complained of shortness of breath on minimal exertion, pre-syncopal episodes, and palpitations. She had not noticed any recent bleeding such as epistaxis, hematuria or hematochezia The patient denies over the counter NSAID ingestion. She is not on antiplatelets agents. 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 Her age appropriate screening programs are up-to-date. She has significant pica, chewing on ice and craving "rocks"; she eats a variety of diet. She never donated blood or received blood transfusion The patient was prescribed oral iron supplements and she takes iron supplement 3 times a day but is still iron deficient She received intravenous iron infusion in December 2017  I have reviewed the past medical history, past surgical history, social history and family history with the patient and they are unchanged from previous note.  ALLERGIES:  is allergic to erythromycin and penicillins.  MEDICATIONS:  Current Outpatient Medications  Medication Sig Dispense Refill  . acetaminophen (TYLENOL) 500 MG tablet Take 1,000 mg by mouth every 6 (six) hours as needed for mild pain.    . Ascorbic Acid (VITAMIN C PO) Take 1 tablet by mouth daily.    Marland Kitchen CALCIUM  PO Take 1 tablet by mouth.    . Cholecalciferol (VITAMIN D3) 2000 UNITS TABS Take 1 tablet by mouth daily.    Marland Kitchen ibuprofen (ADVIL,MOTRIN) 600 MG tablet Take 600 mg by mouth every 6 (six) hours as needed for moderate pain.    . IRON PO Take 1  tablet by mouth daily.    Marland Kitchen levothyroxine (SYNTHROID, LEVOTHROID) 100 MCG tablet Take 100 mcg by mouth daily before breakfast.    . loratadine (CLARITIN) 10 MG tablet Take 10 mg by mouth daily.    . magnesium oxide (MAG-OX) 400 MG tablet Take 400 mg by mouth daily.    . metFORMIN (GLUCOPHAGE) 850 MG tablet Take 850 mg by mouth 3 (three) times daily.    Marland Kitchen omega-3 acid ethyl esters (LOVAZA) 1 G capsule Take 1 g by mouth daily.    . Prenatal Vit-Fe Fumarate-FA (PRENATAL MULTIVITAMIN) TABS tablet Take 1 tablet by mouth daily at 12 noon.    . sertraline (ZOLOFT) 100 MG tablet Take 100 mg by mouth daily.     No current facility-administered medications for this visit.      REVIEW OF SYSTEMS:   Constitutional: Denies fevers, chills or night sweats Eyes: Denies blurriness of vision Ears, nose, mouth, throat, and face: Denies mucositis or sore throat Respiratory: Denies cough, dyspnea or wheezes Cardiovascular: Denies palpitation, chest discomfort or lower extremity swelling Gastrointestinal:  Denies nausea, heartburn or change in bowel habits Skin: Denies abnormal skin rashes Lymphatics: Denies new lymphadenopathy or easy bruising Neurological:Denies numbness, tingling or new weaknesses Behavioral/Psych: Mood is stable, no new changes  All other systems were reviewed with the patient and are negative.  PHYSICAL EXAMINATION: ECOG PERFORMANCE STATUS: 1 - Symptomatic but completely ambulatory  Vitals:   03/07/18 1104  BP: 121/68  Pulse: (!) 59  Resp: 18  Temp: 98.4 F (36.9 C)  SpO2: 100%   Filed Weights   03/07/18 1104  Weight: 171 lb 9.6 oz (77.8 kg)    GENERAL:alert, no distress and comfortable NEURO: alert & oriented x 3 with fluent speech, no focal motor/sensory deficits  LABORATORY DATA:  I have reviewed the data as listed  No results found for: NA, K, CL, CO2, GLUCOSE, BUN, CREATININE, CALCIUM, PROT, ALBUMIN, AST, ALT, ALKPHOS, BILITOT, GFRNONAA, GFRAA  No results found  for: SPEP, UPEP  Lab Results  Component Value Date   WBC 5.7 03/07/2018   NEUTROABS 3.9 03/07/2018   HGB 14.4 03/07/2018   HCT 44.0 03/07/2018   MCV 94.3 03/07/2018   PLT 228 03/07/2018      Chemistry   No results found for: NA, K, CL, CO2, BUN, CREATININE, GLU No results found for: CALCIUM, ALKPHOS, AST, ALT, BILITOT     I spent 10 minutes counseling the patient face to face. The total time spent in the appointment was 15 minutes and more than 50% was on counseling.   All questions were answered. The patient knows to call the clinic with any problems, questions or concerns. No barriers to learning was detected.    Heath Lark, MD 5/3/201911:31 AM

## 2018-03-12 DIAGNOSIS — R21 Rash and other nonspecific skin eruption: Secondary | ICD-10-CM | POA: Diagnosis not present

## 2018-03-12 DIAGNOSIS — Z889 Allergy status to unspecified drugs, medicaments and biological substances status: Secondary | ICD-10-CM | POA: Diagnosis not present

## 2018-03-12 DIAGNOSIS — Z880 Allergy status to penicillin: Secondary | ICD-10-CM | POA: Diagnosis not present

## 2018-03-14 ENCOUNTER — Inpatient Hospital Stay: Payer: 59

## 2018-03-14 VITALS — BP 120/64 | HR 68 | Temp 98.5°F | Resp 17

## 2018-03-14 DIAGNOSIS — D508 Other iron deficiency anemias: Secondary | ICD-10-CM

## 2018-03-14 DIAGNOSIS — Z9884 Bariatric surgery status: Secondary | ICD-10-CM

## 2018-03-14 MED ORDER — SODIUM CHLORIDE 0.9 % IV SOLN
510.0000 mg | Freq: Once | INTRAVENOUS | Status: AC
  Administered 2018-03-14: 510 mg via INTRAVENOUS
  Filled 2018-03-14: qty 17

## 2018-03-14 NOTE — Patient Instructions (Signed)

## 2018-03-21 ENCOUNTER — Ambulatory Visit: Admit: 2018-03-21 | Payer: BC Managed Care – PPO | Primary: Family

## 2018-03-21 ENCOUNTER — Ambulatory Visit: Admit: 2018-03-21 | Discharge: 2018-03-25 | Payer: BC Managed Care – PPO | Attending: Family | Primary: Family

## 2018-03-21 DIAGNOSIS — F988 Other specified behavioral and emotional disorders with onset usually occurring in childhood and adolescence: Secondary | ICD-10-CM

## 2018-03-21 MED ORDER — dextroamphetamine-amphetamine (ADDERALL) 10 mg tablet
10 | ORAL_TABLET | ORAL | 0 refills | Status: DC
Start: 2018-03-21 — End: 2018-03-21

## 2018-03-21 MED ORDER — dextroamphetamine-amphetamine (ADDERALL) 10 mg tablet
10 | ORAL_TABLET | ORAL | 0 refills | Status: DC
Start: 2018-03-21 — End: 2019-01-19

## 2018-03-21 MED ORDER — benzonatate (TESSALON PERLES) 100 MG capsule
100 | ORAL_CAPSULE | Freq: Three times a day (TID) | ORAL | 2 refills | 7.00000 days | Status: AC | PRN
Start: 2018-03-21 — End: 2018-04-20

## 2018-03-21 NOTE — Progress Notes (Signed)
ECG completed.  Tracing sent to SOC.

## 2018-03-21 NOTE — Telephone Encounter (Signed)
Sent patient a my chart message to let her know her EKG is normal.

## 2018-03-21 NOTE — Telephone Encounter (Signed)
-----   Message from [name], FNP sent at 03/21/2018  8:56 AM PDT -----  Normal ekg

## 2018-03-21 NOTE — Patient Instructions (Addendum)
Continue current medications.     Start on tessalon perles 100 mg 3 times a day for the cough.    May take this with claritin or zyrtec for nasal drainage.    Use flonase 2 sprays to both nares at bedtime to help with the nasal congestion. May also use saline as needed in between.     Common colds are a viral infection. This is usually not going to respond to an antibiotic. RTC if you develop a fever greater than 100.4, if you have worsening chest discomfort with the cough, or develop face pain. A cough from a cold can last for 10-14 days.     genepro protein.

## 2018-03-21 NOTE — Progress Notes (Signed)
Kerry Allen is a 42 y.o. female seen today for ADD; URI; and Cough  .    Provider in: 1013  Provider exit:[id number]    Patient is alone. The Patient is the medical decision(s) maker attending today's appointment.    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 The current episode started in the past 7 days. The problem has been gradually worsening. There has been no fever. Associated symptoms include congestion, a plugged ear sensation, sinus pain and sneezing. Pertinent negatives include no abdominal pain, diarrhea, dysuria, joint pain, joint swelling, nausea, neck pain or vomiting. She has tried decongestant for the symptoms. The treatment provided moderate relief.     Adult ADD  Patient states that she feels like the Adderall is working for her and feels like she is concentrating better. States that she only started taking the afternoon dose this past week. States that she noticed even more improvement in her overall performance at her job and in her concentration levels.  Total Part A: 2. Total Part B: 7. Please see scanned media for results and additional ROS findings.     Adult ADHD Self-Report Scale (ASRS-V1.1) Symptom Checklist 03/21/2018   How often do you have trouble wrapping up the final details of a project, once the challenging parts have been done? Sometimes   How often do you have difficulty getting things in order when you have to do a task that requries organization? Rarely   How often do you have problems remembering appointments or obligations? Sometimes   When you have a task that requires a lot of  thought, how often do you avoid or delay getting started? Sometimes   How often do you fidget or squirm with your hands or feet when you have to sit down for a long time? Sometimes   How often do you feel overly active and compelled to do things, like you were driven by a motor? Rarely   How often do you make careless mistakes when you have to work on a boring or difficult project? Often   How often do you have difficulty keeping your attention when you are doing boring or repetitive work? Often   How often do you have difficulty concentrating on what people say to you, even when they are speaking to you directly? Sometimes   How often do you misplace or have difficulty finding things at home or at work? Very Often   How often are you distracted by activity or noise around you? Often   How often do you leave your seat in meetings or other situations in which you are expected to remain seated? Rarely   How often do you feel restless or fidgety? Sometimes   How often do you have difficulty unwinding and relaxing when you have time to yourself? Sometimes   How often do you find yourself talking too much when you are in social situations? Sometimes   When you're in a conversation, how often do you find  yourself finishing the sentences of the people you are talking to, before they can finish them themselves? Very Often   How often do you have difficulty waiting your turn in situations when turn taking is required? Sometimes   How often do you interrupt others when they are busy? Often         ALLERGIES:  Allergies   Allergen Reactions   . Penicillins Rash     When she was a child       Outpatient Medications Marked as Taking for the 03/21/18 encounter (Office Visit) with [name], FNP   Medication Sig Dispense Refill   . B-complex vitamin tablet Take 1 tablet by mouth daily. B-Right; Glo Herring     . cholecalciferol, vitamin D3, (VITAMIN D3) 5,000 unit Tab Take by mouth.     Melene Muller ON 05/20/2018]  dextroamphetamine-amphetamine (ADDERALL) 10 mg tablet Take 10 mg at 8 AM and between 12-2PM. 56 tablet 0   . INOSITOL, BULK, MISC by Miscellaneous route.     Marland Kitchen levomefolate calcium (L-METHYLFOLATE ORAL) Take 5 mg by mouth.     . levothyroxine (SYNTHROID, LEVOTHROID) 125 MCG tablet Take 1 tablet by mouth daily. 90 tablet 3   . liothyronine (CYTOMEL) 5 MCG tablet Take 1 tablet by mouth daily. 90 tablet 3   . multivit-min/folic acid/biotin (HAIR,SKIN AND NAILS,FA-BIOTIN, ORAL) Take 2 tablets by mouth daily.     . [DISCONTINUED] dextroamphetamine-amphetamine (ADDERALL) 10 mg tablet Take 10 mg at 8 AM and between 12-2PM. 56 tablet 0   . [DISCONTINUED] dextroamphetamine-amphetamine (ADDERALL) 10 mg tablet Take 10 mg at 8 AM and between 12-2PM. 56 tablet 0   . [DISCONTINUED] dextroamphetamine-amphetamine (ADDERALL) 10 mg tablet Take 10 mg at 8 AM and between 12-2PM. 56 tablet 0       VITALS:  Vitals:    03/21/18 0936   BP: 128/70   BP Location: Left arm   Patient Position: Sitting   Pulse: 104   Resp: 18   Temp: 37.1 ?C (98.8 ?F)   TempSrc: Oral   SpO2: 98%   Weight: 164 lb (74.4 kg)   Height: 5' 2 (1.575 m)     Body mass index is 30 kg/m?Marland Kitchen  No LMP recorded. Patient has had an ablation.    Ideal body weight: 110 lb 7.2 oz (50.1 kg)  Adjusted ideal body weight: 131 lb 13.9 oz (59.8 kg)     Wt Readings from Last 3 Encounters:   03/21/18 164 lb (74.4 kg)   02/21/18 171 lb (77.6 kg)   11/08/17 170 lb (77.1 kg)       Review of Systems   Constitutional: Positive for appetite change and fatigue. Negative for activity change.        Patient is watching what she is eating.   HENT: Positive for congestion, sinus pain and sneezing.    Gastrointestinal: Negative for abdominal pain, diarrhea, nausea and vomiting.   Genitourinary: Negative for dysuria.   Musculoskeletal: Negative for joint pain and neck pain.   Psychiatric/Behavioral:        See HPI     Physical Exam   Constitutional: She appears well-developed and well-nourished.      Does not appear to feel well   HENT:   Right Ear: Hearing normal. Tympanic membrane is not erythematous.   Left Ear: Hearing normal. Tympanic membrane is not erythematous.   Nose: Rhinorrhea present.   Mouth/Throat: Oropharynx is clear and moist.   Mild congestion, no sinus tenderness noted,  no tonsillar hypertrophy, no erythema to tonsillar pillars, no exudate   Eyes:   Bilateral allergic shiners   Neck:   no LAD noted to anterior/posterior cervical chains.   Pulmonary/Chest: She has no wheezes.   Psychiatric:   APPEARANCE: Well developed, well nourished, adequately groomed, appropriate dress for stated age and present situation    BEHAVIOR: Good eye contact, Calm and cooperative  ORIENTATION: person, place and time/date  MEMORY:  Recent and remote grossly intact.  CONCIOUSNESS: awake and responds appropriately  SPEECH: Normal rate, tone and volume.  MOTOR: normal psychomotor activity, No evidence of tremors or dystonia. No psychomotor agitation or retardation. Steady gait, normal station.  MOOD:  pleasant   AFFECT: Congruent, full range and appropriate.  THOUGHT PROCESS/ASSOCIATIONS: Clarity is coherent, Linear thoughts are intact.   THOUGHT CONTENT: appropriate to present situation; Denies any suicidal or homicidal ideations. Denies any auditory or visual hallucinations. No overt evidence of any delusions or paranoia.  INSIGHT: fair  JUDGMENT: fair  IMPULSE CONTROL: fair  ATTENTION/CONCENTRATION: Sufficient/Intact and appropriate.  FUND OF KNOWLEDGE:  Normal/average  LEVEL OF SELF ESTEEM:  average     Items reviewed in preparation for this visit:  The following portions of the patient's history were reviewed and updated as appropriate: allergies, current medications, past family history, past medical history, past surgical history and problem list.  When necessary and appropriate, prior medical records, medical history from family members or other providers were reviewed for coordination of care.     LABWORK  COMPLETED PRIOR TO THIS VISIT:  Lab Walk-In on 02/28/2018   Component Date Value Ref Range Status   . Vitamin B2 02/28/2018 12  1 - 19 mcg/L Final   . Vitamin B6 (Pyridoxal 5-Phosphate) 02/28/2018 40  5 - 50 mcg/L Final   . Vitamin B12 02/28/2018 612  150 - 840 pg/mL Final   . Folate 02/28/2018 >20.0  5.9 - 24.8 ng/mL Final   . Vitamin D 25-OH Total 02/28/2018 58.4  30.0 - 100.0 ng/mL Final       RADIOLOGY STUDIES COMPLETED PRIOR TO THIS VISIT:  No results found.    Health Maintenance Due  There are no preventive care reminders to display for this patient.    Past Medical History:   Diagnosis Date   . Abnormal ultrasound 05/13/2017   . hx of Graves' disease 2002    s/p I131  in 2002   . Postablative hypothyroidism     Hypothyroidism-Dr. Leonia Reader 2002   . Vitamin D deficiency      Past Surgical History:   Procedure Laterality Date   . ABDOMINAL SURGERY     . CERVICAL BIOPSY     . PANNICULECTOMY  2005    tummy tuck   . PROCEDURE Right 06/27/2017    Procedure: LAPAROSCOPY;  ADHESIOLYSIS; ARGON BEAM TREATMENT OF EXTENSIVE ENDOMETRIOSIS;  Surgeon: Rolene Arbour, MD;  Location: [facility] OR;  Service: Gynecology;  Laterality: Right;   . PROCEDURE N/A 11/08/2017    Procedure: COLONOSCOPY with sedation;  Surgeon: Olin Pia, MD;  Location: [facility] ENDO;  Service: Endoscopy;  Laterality: N/A;   . TUBAL LIGATION      Tubal ligation 2008     Family History   Problem Relation Age of Onset   . Diabetes Mother    . Cancer Mother    . Heart attack Father    . Breast cancer Neg Hx    . Ovarian cancer Neg Hx       Social History  Socioeconomic History   . Marital status: Married     Spouse name: Jerrod   . Number of children: 2   . Years of education: Not on file   . Highest education level: Not on file   Tobacco Use   . Smoking status: Never Smoker   . Smokeless tobacco: Never Used   . Tobacco comment: passive smoke exposure   Substance and Sexual Activity   . Alcohol use: Yes     Alcohol/week: 0.0 oz     Comment: occasional    . Drug  use: No   . Sexual activity: Yes     Partners: Male     Birth control/protection: Surgical     Comment: Tubal Ligation   Other Topics Concern   . Caffeine Concern Yes     Comment: moderate   . Exercise Yes     Comment: occasional        I had a thoughtful and careful discussion regarding the issues today. All questions were answered. I educated and reassured. I encouraged followup as needed. Hand written instructions were given via the After Visit Summary including reference literature as appropriate. Today's medical decision maker  verbalize understanding of the treatment plan and are in agreement with the treatment plan goals. Medical decision maker is aware of how to contact clinical provider if concerns arise.    PATIENT INSTRUCTIONS  Patient Instructions   Continue current medications.     Start on tessalon perles 100 mg 3 times a day for the cough.    May take this with claritin or zyrtec for nasal drainage.    Use flonase 2 sprays to both nares at bedtime to help with the nasal congestion. May also use saline as needed in between.     Common colds are a viral infection. This is usually not going to respond to an antibiotic. RTC if you develop a fever greater than 100.4, if you have worsening chest discomfort with the cough, or develop face pain. A cough from a cold can last for 10-14 days.     genepro protein.       FOLLOW-UP:     Future Appointments   Date Time Provider Department Center   03/25/2018  8:55 PM OVERNIGHT STUDIES [facility] SLEEP None   05/23/2018 10:00 AM [name], FNP [id number]   06/16/2018  8:00 AM [name], FNP [id number]       This note was transcribed using speech recognition software. Please contact us for clarification if any questions arise relating to the wording of this document.

## 2018-03-25 ENCOUNTER — Other Ambulatory Visit: Admit: 2018-03-26 | Payer: BC Managed Care – PPO | Attending: RPSGT, CRT | Primary: Family

## 2018-03-25 DIAGNOSIS — G4710 Hypersomnia, unspecified: Secondary | ICD-10-CM

## 2018-03-25 NOTE — Progress Notes (Signed)
Pt arrived on time for apt.  03/25/2018 9:40 PM    Hook up was uneventful. PSG, CPAP, and Split night procedures explained to Pt. Pt does not have any questions at this time about procedures. Pt was asked to try and sleep Supine for as long as the Pt was able to tolerate it.  03/25/2018 9:40 PM    Pt asked for Lights Out as soon as hook up was finished.  03/25/2018 9:40 PM    Pt asking for wake time no later than 06:00 as she has to go to work in [employer].  03/25/2018 9:41 PM    Lights Out @ 21:32  03/25/2018 9:41 PM    Cameras down on this study. Not able to see body positions. Also study keeps freezing. Have been on hold waiting for ITS for over 20 mins so far.  03/26/2018 12:21 AM    Pt does not meet split night criteria, PSG study continued.  03/26/2018 1:58 AM    Pt quiet, SpO2 96%  03/26/2018 2:31 AM    Pt moved on pulled Therm / Trans off. Tech in to fix  03/26/2018 3:12 AM    Pt was found sleeping holding Therm in her hands. Therm/Trans put back on and re-tapped.  03/26/2018 3:12 AM    Also verified pt currently on Right side. Have not been able to verify positions due to camera not working.   03/26/2018 3:13 AM    Pt with mild snores, SpO2 96%  03/26/2018 3:44 AM    Pt with mild snores, SpO2 96%  03/26/2018 4:28 AM    Pt quiet, SpO2 97% , Body position unknown due to camera error.  03/26/2018 5:25 AM    Pt states she is awake and wants to D/C the study.  03/26/2018 5:34 AM    Lights On @ 05:34  03/26/2018 5:34 AM    Pt was seen in Left, Right, and Supine positions during the study. Pt demonstrated mild snoring at times during the study but snoring was not constant. Pt did not meet split night criteria so a full night PSG study was performed. Half way thru the study there was a technical error and the lab lost use of the camera systems in most of the rooms, so during the 2nd portion of the night body positions were unknown. Pt to bathroom x0.   03/26/2018 6:12 AM    Pt was advised both during the Hook-up and Un-Hook  process of how to clean paste out of the hair after the study is finished.    03/26/2018 6:12 AM    Pt was given follow up instructions.   03/26/2018 6:12 AM    Per Pt's morning questionnaire, Pt did not take any medications to help with sleep.  03/26/2018 6:13 AM

## 2018-03-27 NOTE — Procedures (Signed)
[facility]    NAME:  Kerry Allen, Kerry L.  AGE:  41  DOB:  04/07/1976  HOSP NO:  [id number]  DATE OF SERVICE:  03/25/2018    DIAGNOSTIC POLYSOMNOGRAM    REFERRING PROVIDER:  [name], FNP    HISTORY:  The patient is a 42 year old female who weighs 171 pounds and is 62   inches tall with a calculated body mass index (BMI) of 31.3.  She reports a   history of snoring and daytime fatigue.  She has a background history of adult   attention deficit disorder, obesity, and thyroid disorder. Current medications   include Adderall (dextroamphetamine-amphetamine), which she takes for ADD.  Her   Epworth sleepiness scale score is 16 out of a possible 24, consistent with   moderately severe daytime hypersomnolence.    PROTOCOL: This study was scored according to AASM standards. A standard   polysomnogram was conducted. This included 3 channels of electroencephalogram   (EEG), 2 channels electrooculogram, (EOG), 3 channels of electromyogram (EMG), 1  channel electrocardiogram, (ECG), 1 channel oxygen saturation and heart rate via  pulse oximeter, 2 channels airflow at the nose and mouth via thermocouple and   pressure transducer, 2 channels respiratory effort via piezo trace sensors,   audio is monitored and video is recorded to monitor movement. Apnea is defined   as a cessation of airflow for at least 10 seconds. Hypopnea is defined as an   abnormal respiratory event lasting at least 10 seconds with at least a 30   percent reduction in thoracoabdominal movement or airflow as compared to   baseline, and with at least a 4 percent oxygen desaturation. Raw digital patient  data is stored for 5 years (minus video) and is then deleted from archive   storage.    Sleep time supine position was 79 minutes with 353 minutes of non-supine sleep.   On the morning after study questionnaire, the patient estimated that she had 6   hours of sleep, which is normal for her and felt that the quality of sleep was   similar to  her usual at home.    SUMMARY OF FINDINGS    SLEEP ARCHITECTURE:  Lights out was at 9:32 p.m. with lights on at 5:34 a.m.    There were 8.0 hours of recorded bedtime with 7.2 hours of sleep time, yielding   a sleep efficiency of 89.7%.  Sleep onset latency was 11 minutes.  REM latency   was 101 minutes.  Stage N1 sleep constituted 2.7% of total sleep time, stage N2   54.6%, stage N3 25.8%, and REM sleep 17.0%.  The total arousal index was 36.1   per hour with 0.8 per hour respiratory, 0.3 per hour PLM related, and 35 per   hour spontaneous arousals.  There were occasional awakenings scattered   throughout the evening.  Wake time after sleep onset was 38.5 minutes.    RESPIRATORY:  1.  There was 1 obstructive apnea, 4 central apneas, and 5 RERAs (respiratory   event related arousals), yielding an apnea plus hypopnea index of 0.7 per hour   and a respiratory disturbance index (RDI) of 1.4 per hour (normal less than 5   per hour).  2.  Mean awake saturation was 97%.  Mean saturations during both non-REM and REM  sleep was also 97%.  There were no significant desaturations on this study with   lowest recorded saturation being 92%.  3.  Occasional mild snoring was  reported by the technician.    LEG MOVEMENT: There were a few periodic limb movements, but only rare periodic   limb movement related arousals.  The overall PLM index was 3.1 per hour and the  PLM with arousal index 0.3 per hour.    EKG:  Single-lead EKG demonstrated sinus rhythm without significant arrhythmias.    IMPRESSION:  1.  No evidence of clinically significant obstructive sleep apnea or   sleep-related breathing disorder.  2.  Mild snoring.  3.  Relatively normal sleep efficiency and sleep architecture, but with an  increased spontaneous arousal index of unknown etiology and significance.  4.  No evidence of clinically significant periodic limb movement disorder during  sleep.    RECOMMENDATIONS:  1.  This study was negative for obstructive sleep apnea  and CPAP would not be   indicated.  2.  Review of sleep hygiene to assess any factors that may be affecting his   sleep quality.  3.  Continued efforts at weight loss.    AXIS A:  1. Hypersomnia, unspecified, G47.10.  2. Primary snoring.    AXIS B:  Polysomnogram    AXIS C:  1.  Obesity.  2.  Attention deficit disorder.    Andi Devon, MD    JS/sb  D:  03/27/2018  3:44 P   T:  03/27/2018  4:23 P   TID:  [id number]  RECEIPT:    [id number]

## 2018-04-04 ENCOUNTER — Encounter: Payer: BC Managed Care – PPO | Attending: Family | Primary: Family

## 2018-04-28 DIAGNOSIS — D509 Iron deficiency anemia, unspecified: Secondary | ICD-10-CM | POA: Diagnosis not present

## 2018-05-06 NOTE — Telephone Encounter (Signed)
LMTCB, need to cancel upcoming appointment and reschedule

## 2018-05-07 NOTE — Telephone Encounter (Signed)
Patient rescheduled her appointment with [name] in August.

## 2018-05-07 NOTE — Telephone Encounter (Signed)
Already an open encounter for this message.  Patient rescheduled her appointment.

## 2018-05-07 NOTE — Telephone Encounter (Signed)
She is returning a call to [place].  ( This is a message from exchange.)

## 2018-05-23 ENCOUNTER — Encounter: Payer: BC Managed Care – PPO | Attending: Family | Primary: Family

## 2018-06-02 ENCOUNTER — Encounter: Payer: BC Managed Care – PPO | Attending: Family | Primary: Family

## 2018-06-02 NOTE — Addendum Note (Signed)
Addended byMattie Marlin on: 06/02/2018 02:40 PM     Modules accepted: Orders

## 2018-06-02 NOTE — Progress Notes (Deleted)
Kerry Allen is a 42 y.o. female seen today for No chief complaint on file.  .    Provider in: [ ]  Provider exit: [ ]     Patient {is/is not:23437} {PDC ACCOMPANIED [id number]. The {APP Patient Accompanied:310001077} {is/are:23057} the medical decision(s) maker attending today's appointment.    ASSESSMENT AND PLAN:  Problem List Items Addressed This Visit     None            Support for the above plan obtained during this visit:    HPI   OB/GYN:  G2/P2. Patient is currently  sexually active with husband, patient has had uterine ablation. Patient does not have periods   Last PAP was completed 07/26/2017, results normal.   Last Mammogram completed 11/17/2016, results normal.  Denies familial history of breast, ovarian, cervical or endometrial/uterine CA. Endorses familial history of ovarian cancer - cousin. Patient had ablation due to precancerous cells.   ?    HEALTH MAINTENANCE:  Last Dental Exam: ***  Dentist's name: ***  Any Abnormalities?: ***  Last Eye Exam: ***  Diabetes screening: 04/26/17  Hyperlipidemia screening: 04/26/17  Influenza Vaccine: declined  TDaP Vaccine: 03/22/2009    BREAST EXAM:  (L) Breast: ***  (R) Breast: ***    A chaperone was present during the procedure/examination portion of the visit. The chaperone present was ***.           ALLERGIES:  Allergies   Allergen Reactions   . Penicillins Rash     When she was a child       No outpatient medications have been marked as taking for the 06/02/18 encounter (Appointment) with [name], FNP.       VITALS:  There were no vitals filed for this visit.  There is no height or weight on file to calculate BMI.  No LMP recorded. Patient has had an ablation.    Ideal body weight: 110 lb 7.2 oz (50.1 kg)  Adjusted ideal body weight: 134 lb 10.7 oz (61.1 kg)     Wt Readings from Last 3 Encounters:   03/25/18 171 lb (77.6 kg)   03/21/18 164 lb (74.4 kg)   02/21/18 171 lb (77.6 kg)       Review of Systems    Also see HPI for ROS    Physical  Exam    ***    Items reviewed in preparation for this visit:  The following portions of the patient's history were reviewed and updated as appropriate: allergies, current medications, past family history, past medical history, past surgical history and problem list.  When necessary and appropriate, prior medical records, medical history from family members or other providers were reviewed for coordination of care.     Specialty consultations since last visit:[ ]     LABWORK COMPLETED PRIOR TO THIS VISIT:  No visits with results within 3 Month(s) from this visit.   Latest known visit with results is:   Lab Walk-In on 02/28/2018   Component Date Value Ref Range Status   . Vitamin B2 02/28/2018 12  1 - 19 mcg/L Final   . Vitamin B6 (Pyridoxal 5-Phosphate) 02/28/2018 40  5 - 50 mcg/L Final   . Vitamin B12 02/28/2018 612  150 - 840 pg/mL Final   . Folate 02/28/2018 >20.0  5.9 - 24.8 ng/mL Final   . Vitamin D 25-OH Total 02/28/2018 58.4  30.0 - 100.0 ng/mL Final       RADIOLOGY STUDIES COMPLETED PRIOR TO THIS VISIT:  No  results found.    Health Maintenance Due  There are no preventive care reminders to display for this patient.    Past Medical History:   Diagnosis Date   . Abnormal ultrasound 05/13/2017   . hx of Graves' disease 2002    s/p I131  in 2002   . Postablative hypothyroidism     Hypothyroidism-Dr. Leonia Reader 2002   . Vitamin D deficiency      Past Surgical History:   Procedure Laterality Date   . ABDOMINAL SURGERY     . CERVICAL BIOPSY     . PANNICULECTOMY  2005    tummy tuck   . PROCEDURE Right 06/27/2017    Procedure: LAPAROSCOPY;  ADHESIOLYSIS; ARGON BEAM TREATMENT OF EXTENSIVE ENDOMETRIOSIS;  Surgeon: Rolene Arbour, MD;  Location: [facility] OR;  Service: Gynecology;  Laterality: Right;   . PROCEDURE N/A 11/08/2017    Procedure: COLONOSCOPY with sedation;  Surgeon: Olin Pia, MD;  Location: [facility] ENDO;  Service: Endoscopy;  Laterality: N/A;   . TUBAL LIGATION      Tubal ligation 2008     Family History   Problem  Relation Age of Onset   . Diabetes Mother    . Cancer Mother    . Heart attack Father    . Breast cancer Neg Hx    . Ovarian cancer Neg Hx       Social History     Socioeconomic History   . Marital status: Married     Spouse name: Jerrod   . Number of children: 2   . Years of education: Not on file   . Highest education level: Not on file   Tobacco Use   . Smoking status: Never Smoker   . Smokeless tobacco: Never Used   . Tobacco comment: passive smoke exposure   Substance and Sexual Activity   . Alcohol use: Yes     Alcohol/week: 0.0 oz     Comment: occasional    . Drug use: No   . Sexual activity: Yes     Partners: Male     Birth control/protection: Surgical     Comment: Tubal Ligation   Other Topics Concern   . Caffeine Concern Yes     Comment: moderate   . Exercise Yes     Comment: occasional        I had a thoughtful and careful discussion regarding the issues today. All questions were answered. I educated and reassured. I encouraged followup as needed. Hand written instructions were given via the After Visit Summary including reference literature as appropriate. Today's medical decision maker  verbalize understanding of the treatment plan and {ARE/ARE NOT:21863} in agreement with the treatment plan goals. Medical decision maker is aware of how to contact clinical provider if concerns arise.    PATIENT INSTRUCTIONS  There are no Patient Instructions on file for this visit.    FOLLOW-UP:  No follow-ups on file.   Future Appointments   Date Time Provider Department Center   06/02/2018  2:30 PM [name], FNP [id number]       This note was transcribed using speech recognition software. Please contact us for clarification if any questions arise relating to the wording of this document.

## 2018-06-02 NOTE — Telephone Encounter (Signed)
LMTCB and reschedule appt on the 12th.

## 2018-06-04 DIAGNOSIS — L814 Other melanin hyperpigmentation: Secondary | ICD-10-CM | POA: Diagnosis not present

## 2018-06-04 DIAGNOSIS — L821 Other seborrheic keratosis: Secondary | ICD-10-CM | POA: Diagnosis not present

## 2018-06-04 DIAGNOSIS — D1801 Hemangioma of skin and subcutaneous tissue: Secondary | ICD-10-CM | POA: Diagnosis not present

## 2018-06-16 ENCOUNTER — Encounter: Payer: BC Managed Care – PPO | Attending: Family | Primary: Family

## 2018-07-10 DIAGNOSIS — N951 Menopausal and female climacteric states: Secondary | ICD-10-CM | POA: Diagnosis not present

## 2018-07-10 DIAGNOSIS — R5383 Other fatigue: Secondary | ICD-10-CM | POA: Diagnosis not present

## 2018-07-10 DIAGNOSIS — E039 Hypothyroidism, unspecified: Secondary | ICD-10-CM | POA: Diagnosis not present

## 2018-07-24 DIAGNOSIS — N951 Menopausal and female climacteric states: Secondary | ICD-10-CM | POA: Diagnosis not present

## 2018-08-05 ENCOUNTER — Encounter: Payer: BC Managed Care – PPO | Attending: Family | Primary: Family

## 2018-08-05 NOTE — Progress Notes (Deleted)
Kerry Allen is a 42 y.o. female seen today for No chief complaint on file.  .    Provider in: [ ]  Provider exit: [ ]     Patient {is/is not:23437} {PDC ACCOMPANIED O566101. The {APP Patient Accompanied:310001077} {is/are:23057} the medical decision(s) maker attending today's appointment.    ASSESSMENT AND PLAN:  Problem List Items Addressed This Visit     None            Support for the above plan obtained during this visit:    HPI   OB/GYN:  G2/P2.  Patient is currently  sexually active with husband, patient has had uterine ablation.  Last PAP was completed 07/26/2017, results normal. Patient has history of abnormal results in the past. Patient does not have periods.  Last Mammogram completed 05/17/2017, results normal.  Denies familial history of breast, cervical or endometrial/uterine CA. Endorses familial history of ovarian cancer - cousin. Patient had ablation due to precancerous cells.   ?  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 Penicillins Rash     When she was a child       No outpatient medications have been marked as taking for the 08/05/18 encounter (Appointment) with [name], FNP.       VITALS:  There were no vitals filed for this visit.  There is no height or weight on file to calculate BMI.  No LMP recorded. Patient has had an ablation.    Patient weight not recorded     Wt Readings from Last 3 Encounters:   03/25/18 171 lb (77.6 kg)   03/21/18 164 lb (74.4 kg)   02/21/18 171 lb (77.6 kg)       Review of Systems    Also see HPI for ROS    Physical Exam    ***    Items reviewed in preparation for this visit:  The following  portions of the patient's history were reviewed and updated as appropriate: allergies, current medications, past family history, past medical history, past surgical history and problem list.  When necessary and appropriate, prior medical records, medical history from family members or other providers were reviewed for coordination of care.     Specialty consultations since last visit:[ ]     LABWORK COMPLETED PRIOR TO THIS VISIT:  No visits with results within 3 Month(s) from this visit.   Latest known visit with results is:   Lab Walk-In on 02/28/2018   Component Date Value Ref Range Status   . Vitamin B2 02/28/2018 12  1 - 19 mcg/L Final   . Vitamin B6 (Pyridoxal 5-Phosphate) 02/28/2018 40  5 - 50 mcg/L Final   . Vitamin B12 02/28/2018 612  150 - 840 pg/mL Final   . Folate 02/28/2018 >20.0  5.9 - 24.8 ng/mL Final   . Vitamin D 25-OH Total 02/28/2018 58.4  30.0 - 100.0 ng/mL Final       RADIOLOGY STUDIES COMPLETED PRIOR TO THIS VISIT:  No results found.    Health Maintenance Due  Health Maintenance Due   Topic Date Due   . INFLUENZA VACCINE  08/05/2018  Past Medical History:   Diagnosis Date   . Abnormal ultrasound 05/13/2017   . hx of Graves' disease 2002    s/p I131  in 2002   . Postablative hypothyroidism     Hypothyroidism-Dr. Leonia Reader 2002   . Vitamin D deficiency      Past Surgical History:   Procedure Laterality Date   . ABDOMINAL SURGERY     . CERVICAL BIOPSY     . PANNICULECTOMY  2005    tummy tuck   . PROCEDURE Right 06/27/2017    Procedure: LAPAROSCOPY;  ADHESIOLYSIS; ARGON BEAM TREATMENT OF EXTENSIVE ENDOMETRIOSIS;  Surgeon: Rolene Arbour, MD;  Location: [facility] OR;  Service: Gynecology;  Laterality: Right;   . PROCEDURE N/A 11/08/2017    Procedure: COLONOSCOPY with sedation;  Surgeon: Olin Pia, MD;  Location: [facility] ENDO;  Service: Endoscopy;  Laterality: N/A;   . TUBAL LIGATION      Tubal ligation 2008     Family History   Problem Relation Age of Onset   . Diabetes Mother    . Cancer Mother    . Heart  attack Father    . Breast cancer Neg Hx    . Ovarian cancer Neg Hx       Social History     Socioeconomic History   . Marital status: Married     Spouse name: Jerrod   . Number of children: 2   . Years of education: Not on file   . Highest education level: Not on file   Tobacco Use   . Smoking status: Never Smoker   . Smokeless tobacco: Never Used   . Tobacco comment: passive smoke exposure   Substance and Sexual Activity   . Alcohol use: Yes     Alcohol/week: 0.0 standard drinks     Comment: occasional    . Drug use: No   . Sexual activity: Yes     Partners: Male     Birth control/protection: Surgical     Comment: Tubal Ligation   Other Topics Concern   . Caffeine Concern Yes     Comment: moderate   . Exercise Yes     Comment: occasional        I had a thoughtful and careful discussion regarding the issues today. All questions were answered. I educated and reassured. I encouraged followup as needed. Hand written instructions were given via the After Visit Summary including reference literature as appropriate. Today's medical decision maker  verbalize understanding of the treatment plan and {ARE/ARE NOT:21863} in agreement with the treatment plan goals. Medical decision maker is aware of how to contact clinical provider if concerns arise.    PATIENT INSTRUCTIONS  There are no Patient Instructions on file for this visit.    FOLLOW-UP:  No follow-ups on file.   Future Appointments   Date Time Provider Department Center   08/05/2018  1:30 PM [name], FNP [id number]       This note was transcribed using speech recognition software. Please contact us for clarification if any questions arise relating to the wording of this document.

## 2018-08-18 DIAGNOSIS — J209 Acute bronchitis, unspecified: Secondary | ICD-10-CM | POA: Diagnosis not present

## 2018-09-10 LAB — URINE CULTURE: Culture Result: >100000 — AB

## 2018-09-16 NOTE — Procedures (Addendum)
Arrival time   1145          Surgery time   1345         Call for time changes: [phone]  Instructed NPO after MN solids; NPO after   0730   clear liquids  Instructed to take the following meds with sip of water in AM of surgery:Levothyroxine, Cytomel  Instructed to hold the following meds the morning of surgery: N/A  Instructed in use of  Hibiclens  Instructed to have a ride home  Instructed to remove all jewelry, and leave at home, and leave all valuables at home  Instructed to leave all medications at home except eye drops and rescue inhalers  Instructed to bring minimal personal care items  Instructed to check in at main registration day of surgery  Instructed to stop all supplements until after surgery  MRSA screen done; swab needed

## 2018-09-17 NOTE — Anesthesia Pre-Procedure Evaluation (Signed)
Anesthesia Evaluation     Patient summary reviewed      Airway   Mallampati: II  TM distance: > 8 cm  Neck ROM: full  Comment: Small mouth opening  Dental - normal exam     Pulmonary - negative ROS and normal exam    breath sounds clear to auscultation  (-) COPD, asthma, sleep apnea  Cardiovascular - negative ROS and normal exam  Exercise tolerance: good (4-7 METS)    Rhythm: regular  Rate: normal    Neuro/Psych - negative ROS   (-) seizures, neuromuscular disease    GI/Hepatic/Renal - negative ROS   (-) GERD    Endo/Other  (+) , hypothyroidism (h/o graves s/p ablation)   Musculoskeletal - negative ROS                   Anesthesia Plan    ASA 2     general     intravenous induction         Anesthetic plan, risks and benefits discussed with patient.  Risks discussed included (but were not limited to)   General risks drug reaction, perioprative CV events and respiratory eventsConsenting person understands and agrees to proceed. PARQ.  Pre-Anesthesia Evaluation Completed at:  09/18/2018 2:13 PM

## 2018-09-18 LAB — TISSUE EXAM

## 2018-09-18 LAB — MRSA BY PCR: MRSA DNA Result: NOT DETECTED

## 2018-09-18 MED ORDER — HYDROmorphone (PF) (DILAUDID) injection 0.2-0.4 mg
0.5 | INTRAMUSCULAR | Status: DC | PRN
Start: 2018-09-18 — End: 2018-09-18

## 2018-09-18 MED ORDER — diphenhydrAMINE (BENADRYL) injection 25 mg
50 | INTRAMUSCULAR | Status: DC | PRN
Start: 2018-09-18 — End: 2018-09-18

## 2018-09-18 MED ORDER — HYDROmorphone (PF) (DILAUDID) injection 0.5-0.8 mg
0.5 | INTRAMUSCULAR | Status: DC | PRN
Start: 2018-09-18 — End: 2018-09-18

## 2018-09-18 MED ORDER — meperidine (PF) (DEMEROL) injection 12.5 mg
50 | INTRAMUSCULAR | Status: DC | PRN
Start: 2018-09-18 — End: 2018-09-18

## 2018-09-18 MED ORDER — ceFAZolin (ANCEF) injection
1 | INTRAMUSCULAR | Status: DC | PRN
Start: 2018-09-18 — End: 2018-09-18
  Administered 2018-09-18: 23:00:00 1 gram via INTRAVENOUS

## 2018-09-18 MED ORDER — electrolyte-S (ISOLYTE-S) infusion
INTRAVENOUS | Status: DC | PRN
Start: 2018-09-18 — End: 2018-09-18
  Administered 2018-09-18: 23:00:00 via INTRAVENOUS

## 2018-09-18 MED ORDER — fentaNYL (PF) (SUBLIMAZE) injection
50 | INTRAMUSCULAR | Status: DC | PRN
Start: 2018-09-18 — End: 2018-09-18
  Administered 2018-09-18 (×2): 50 mcg/mL via INTRAVENOUS

## 2018-09-18 MED ORDER — fentaNYL (PF) (SUBLIMAZE) injection 50-100 mcg
50 | INTRAMUSCULAR | Status: DC | PRN
Start: 2018-09-18 — End: 2018-09-18

## 2018-09-18 MED ORDER — HYDROmorphone (DILAUDID) syringe
1 | INTRAMUSCULAR | Status: DC | PRN
Start: 2018-09-18 — End: 2018-09-18
  Administered 2018-09-18: 23:00:00 1 mg/mL via INTRAVENOUS

## 2018-09-18 MED ORDER — gentamicin 300 mg in sodium chloride 0.9 % (NS) 100 mL IVPB
40 | INTRAMUSCULAR | Status: DC | PRN
Start: 2018-09-18 — End: 2018-09-18

## 2018-09-18 MED ORDER — sugammadex (BRIDION) injection
100 | INTRAVENOUS | Status: DC | PRN
Start: 2018-09-18 — End: 2018-09-18
  Administered 2018-09-18: 100 mg/mL via INTRAVENOUS

## 2018-09-18 MED ORDER — haloperidol lactate (HALDOL) injection 0.5-2 mg
5 | Freq: Once | INTRAMUSCULAR | Status: DC | PRN
Start: 2018-09-18 — End: 2018-09-18

## 2018-09-18 MED ORDER — glycopyrrolate (ROBINUL) injection 0.4 mg
0.2 | INTRAMUSCULAR | Status: DC | PRN
Start: 2018-09-18 — End: 2018-09-18

## 2018-09-18 MED ORDER — rocuronium (ZEMURON) injection
10 | INTRAVENOUS | Status: DC | PRN
Start: 2018-09-18 — End: 2018-09-18
  Administered 2018-09-18: 22:00:00 10 mg/mL via INTRAVENOUS

## 2018-09-18 MED ORDER — labetalol (NORMODYNE, TRANDATE) syringe
20 | INTRAVENOUS | Status: DC | PRN
Start: 2018-09-18 — End: 2018-09-18
  Administered 2018-09-18: 23:00:00 20 mg/4 mL (5 mg/mL) via INTRAVENOUS

## 2018-09-18 MED ORDER — HYDROmorphone (DILAUDID) 2 mg/mL injection
2 | INTRAMUSCULAR | Status: AC
Start: 2018-09-18 — End: ?

## 2018-09-18 MED ORDER — lactated Ringers infusion
INTRAVENOUS | Status: DC
Start: 2018-09-18 — End: 2018-09-18
  Administered 2018-09-18 – 2018-09-19 (×3): via INTRAVENOUS

## 2018-09-18 MED ORDER — fentaNYL (PF) (SUBLIMAZE) 50 mcg/mL injection
50 | INTRAMUSCULAR | Status: AC
Start: 2018-09-18 — End: ?

## 2018-09-18 MED ORDER — clindamycin (CLEOCIN) 900 mg in D5W IVPB Premix
900 | INTRAVENOUS | Status: DC | PRN
Start: 2018-09-18 — End: 2018-09-18

## 2018-09-18 MED ORDER — metoclopramide HCl (REGLAN) injection 10 mg
5 | Freq: Once | INTRAMUSCULAR | Status: DC | PRN
Start: 2018-09-18 — End: 2018-09-18

## 2018-09-18 MED ORDER — fentaNYL (PF) (SUBLIMAZE) injection 12.5-25 mcg
50 | INTRAMUSCULAR | Status: DC | PRN
Start: 2018-09-18 — End: 2018-09-18

## 2018-09-18 MED ORDER — bupivacaine-EPINEPHrine 0.5 %-1:200,000 injection
0.5 | INTRAMUSCULAR | Status: DC | PRN
Start: 2018-09-18 — End: 2018-09-18
  Administered 2018-09-18: 0.5 %-1:200,000 via SUBCUTANEOUS

## 2018-09-18 MED ORDER — ondansetron (ZOFRAN) injection 4 mg
4 | Freq: Four times a day (QID) | INTRAMUSCULAR | Status: DC | PRN
Start: 2018-09-18 — End: 2018-09-18

## 2018-09-18 MED ORDER — propofol (DIPRIVAN) injection
10 | INTRAVENOUS | Status: DC | PRN
Start: 2018-09-18 — End: 2018-09-18
  Administered 2018-09-18: 22:00:00 10 mg/mL via INTRAVENOUS

## 2018-09-18 MED ORDER — ondansetron (ZOFRAN) injection
4 | INTRAMUSCULAR | Status: DC | PRN
Start: 2018-09-18 — End: 2018-09-18
  Administered 2018-09-18: 23:00:00 4 mg/2 mL via INTRAVENOUS

## 2018-09-18 MED ORDER — sodium chloride (NS) 0.9% irrigation
0.9 | Status: DC | PRN
Start: 2018-09-18 — End: 2018-09-18
  Administered 2018-09-18: 23:00:00 0.9 % irrigation

## 2018-09-18 MED ORDER — labetalol (NORMODYNE, TRANDATE) syringe 5 mg
20 | INTRAVENOUS | Status: DC | PRN
Start: 2018-09-18 — End: 2018-09-18

## 2018-09-18 MED ORDER — ipratropium-albuterol (DUO-NEB) 0.5 mg-3 mg(2.5 mg base)/3 mL nebulizer solution 3 mL
0.5 | RESPIRATORY_TRACT | Status: DC | PRN
Start: 2018-09-18 — End: 2018-09-18

## 2018-09-18 MED ORDER — belladonna alkaloids-opium (B&O SUPPRETTES) 16.2-30 mg suppository
16.2-30 | RECTAL | Status: DC | PRN
Start: 2018-09-18 — End: 2018-09-18
  Administered 2018-09-18: 23:00:00 via RECTAL

## 2018-09-18 MED ORDER — lidocaine 20 mg/mL (2 %) injection
20 | INTRAMUSCULAR | Status: DC | PRN
Start: 2018-09-18 — End: 2018-09-18
  Administered 2018-09-18: 22:00:00 20 mg/mL (2 %) via INTRAVENOUS

## 2018-09-18 MED ORDER — hydrALAZINE (APRESOLINE) injection 10 mg
20 | INTRAMUSCULAR | Status: DC | PRN
Start: 2018-09-18 — End: 2018-09-18

## 2018-09-18 MED ORDER — belladonna alkaloids-opium (B&O SUPPRETTES) 16.2-30 mg suppository
16.2-30 | RECTAL | Status: AC
Start: 2018-09-18 — End: ?

## 2018-09-18 MED ORDER — atropine injection 0.4 mg
0.4 | INTRAMUSCULAR | Status: DC | PRN
Start: 2018-09-18 — End: 2018-09-18

## 2018-09-18 MED ORDER — naloxone (NARCAN) injection 0.08 mg
0.4 | INTRAMUSCULAR | Status: DC | PRN
Start: 2018-09-18 — End: 2018-09-18

## 2018-09-18 MED ORDER — fentaNYL (PF) (SUBLIMAZE) injection 25-50 mcg
50 | INTRAMUSCULAR | Status: DC | PRN
Start: 2018-09-18 — End: 2018-09-18

## 2018-09-18 MED FILL — HYDROMORPHONE 2 MG/ML INJECTION SOLUTION: 2 mg/mL | INTRAMUSCULAR | Qty: 1

## 2018-09-18 MED FILL — BELLADONNA ALKALOIDS-OPIUM 16.2 MG-30 MG RECTAL SUPPOSITORY: 16.2-30 mg | RECTAL | Qty: 1

## 2018-09-18 MED FILL — FENTANYL (PF) 50 MCG/ML INJECTION SOLUTION: 50 ug/mL | INTRAMUSCULAR | Qty: 5

## 2018-09-18 MED FILL — CLINDAMYCIN 900 MG/50 ML IN 5 % DEXTROSE INTRAVENOUS PIGGYBACK: 900 mg/50 mL | INTRAVENOUS | Qty: 50

## 2018-09-18 MED FILL — GENTAMICIN 40 MG/ML INJECTION SOLUTION: 40 mg/mL | INTRAMUSCULAR | Qty: 7.5

## 2018-09-18 NOTE — H&P (Signed)
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    She has continued to have right lower quadrant pain in the area of McBurney   point that tends to wax and wane and has been present for over 2 years.  Her   right lower quadrant abdominal pain is always present, but can be severe in a   cyclic fashion such that she needs to ball up in a fetal position.  Bladder   and bowel function has been normal.  I did find significant pericecal adhesions   to the anterior abdominal wall, which I lysed.  She did have endometriosis in   the deep pelvis that I ablated with the argon beam coagulator, but that was not   symptomatic, and there was no evident endometriosis involving the appendix,   cecum, or right adnexa or in the anterior abdominal wall.  I recommended   consultation with gastroenterology.  Colonoscopy 11/08/2016 was normal.  She saw  Heron Sabins, FNP at [facility] on 11/21/2017 and dicyclomine   (Bentyl) trial and CT abdomen and pelvis with contrast was recommended, but she   demurred and I doubt that would be helpful.    PAST MEDICAL HISTORY:  Significants for thyroid dysfunction.    PAST SURGICAL HISTORY:  She is status post normal vaginal birth x2, laparoscopic  bilateral tubal ligation in 2003 (findings were normal, she states), thyroid   ablation for Graves', abdominoplasty in 2005, laparoscopy with lysis of   pericecal adhesions and argon beam coagulator treatment of moderate deep pelvic   endometriosis on 06/27/2017, NovaSure endometrial ablation per Dr. Brooke Dare 2011   (she has been amenorrheic subsequently).    PRESENT MEDICATIONS:  1.  Adderall 10 mg daily.  2.  Levoxyl 125 mcg daily.    ALLERGIES:  PENICILLIN CAUSES A RASH.    SOCIAL HISTORY:   The patient is married.  She is a nonsmoker.  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  She does not abuse alcohol or other drugs.    FAMILY HISTORY:  Unrevealing.    REVIEW OF SYSTEMS:  Unremarkable, except as noted above.    PHYSICAL EXAMINATION:  GENERAL:  This is a delightful, intelligent female in no acute distress.  VITAL SIGNS:  She is 5 feet 2 inches.  Weight is 167, BMI is 30.5.  Blood   pressure 119/82.  HEENT:  Normal.  Thyroid is normal.  CHEST:  Clear.  CARDIOVASCULAR:  Reveals a regular rate and rhythm.  There is no murmur.  BREASTS:  Negative.  There is no mass, skin change or adenopathy, and she had a   normal mammogram in 2018.  ABDOMEN:  Reveals mild tenderness near McBurney point, but no peritoneal signs,   fascial defect, mass, or adenopathy.  PELVIC:  External genitalia are normal.  Vagina and cervix is normal.  There is   no cervical motion tenderness.  The uterus is midposition, normal size,   nontender and the adnexa are negative.  She does have mild induration in the   posterior cul-de-sac, uterosacral ligaments, but no mass.    IMPRESSION:  Chronic right lower quadrant abdominal pain, has not resolved   status post laparoscopy with lysis of pericecal adhesions and argon beam   treatment  of moderate deep pelvic endometriosis on 06/27/2017.  Colonoscopy was   normal.  This pain is not abdominal wall in origin or etiology.  Endometriosis   may involve the appendix and/or the adnexa.  Her left ovary has never been   bothersome and she has been amenorrheic status post endometrial ablation.  She   does not have deep dyspareunia and denies pelvic pain chronically.    PLAN:  I recommend repeat laparoscopy with treatment of any persistent   endometriosis, lysis of adhesions, right salpingo-oophorectomy and appendectomy   as an outpatient at [facility].  I discussed these procedures with the patient in the   context of a PARQ conference.  Her questions were encouraged and answered and   she does  wish to proceed as outlined.    Clarene Critchley. Florian Buff, MD    PWS/sponmt  D:  09/16/2018  4:22 P   T:  09/16/2018  4:49 P   TID:  [id number]  RECEIPT:    [id number]  cc:  [name], FNP

## 2018-09-18 NOTE — Brief Op Note (Signed)
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  Tissue Fallopian Tube, Right TISSUE EXAM Rolene Arbour, MD 09/18/2018 1526               Implants:none    Antibiotics (admin):   Recent Abx Admin                   ceFAZolin (ANCEF) injection 1 g (g) 2 g Given 09/18/18 1436                Incision Time:   Anes Incision Time     Date Time Event    09/18/2018 1440 Incision / Procedure Start           Timeout Completed:  Timeouts     Barrie Lyme at [facility] Sep 18, 2018 1439     Timeout Details     Timeout type:  Pre-incision                Clydene Fake, RN at [facility] Sep 18, 2018 1536     Timeout Details     Timeout type:  Sign-out                       Estimated Blood Loss: * 10 mLs - 09/18/2018  2:40 PM to 09/18/2018  3:38 PM *    Urethral Catheter: No  [REMOVED] Urethral Catheter Latex 16 Fr. (1)   Removal Date/Time: 09/18/18 1532  Placement Date/Time: 09/18/18 1440   Inserted by: Rolene Arbour, MD  Sterile Technique: Yes  Catheter Type: Latex  Tube Size (Fr.): 16 Fr.  Catheter Balloon Size: 10 mL  Collection Container: Standard drainage bag  U...   Number of days: 0       Findings: Right periadnexal adhesions,  right hydrosalpinx, perappendiceal adhesions.  Normal uterus and left adnexa.    Complications: None     Disposition: PACU - hemodynamically stable.    Rolene Arbour

## 2018-09-18 NOTE — Op Note (Signed)
DATE OF PROCEDURE:  09/18/2018    PREOPERATIVE DIAGNOSIS:  Chronic right lower quadrant pain.    POSTOPERATIVE DIAGNOSES:  1.  Chronic right lower quadrant pain, probably secondary to right periadnexal   and periappendiceal adhesions.  2.  Right proximal hydrosalpinx (S/P BTL).    OPERATIONS PERFORMED:  1.  Laparoscopy with lysis of adhesions.  2.  Right salpingo-oophorectomy (left tube and ovary were normal and spared).  3.  Appendectomy.    SURGEON:  Rolene Arbour, M.D.    ASSISTANT:  Rubin Payor, RNFA/FNP.    ANESTHESIA:  Hubbard Hartshorn, MD, general endotracheal.    FINDINGS:  Within the pelvis, the uterus was normal as were left adnexa.  Both   fallopian tubes were status post mid segment salpingectomy for sterilization.    The proximal right fallopian tube was dilated in the fashion of a hydrosalpinx;   greatest diameter was about 1.5 cm.  I did not see any remaining endometriosis.   She had right adnexal adhesions to the sidewall.  She also had periappendiceal   adhesions to the pelvic brim.  These were lysed.  Within the upper abdomen, the   remainder of the bowel that I could see was normal.  The liver was normal size.   It had a smooth surface and a sharp edge, and the gallbladder was normal.    DESCRIPTION OF PROCEDURE:  The patient was properly identified.  General   anesthesia was induced.  She was positioned in lithotomy on Allen stirrups and   on the pink pad.  Perineum and vagina were prepped and draped and Foley catheter  was placed in a sterile fashion.  Speculum was placed in the vagina.  Cervix was  grasped by its anterior lip with a single-tooth tenaculum.  An acorn cannula was  introduced.  Speculum was removed.  I proceeded to the abdomen.  I changed my   gloves.  Abdomen was prepped and draped, and the main 12 mm laparoscopic   incision was made about 5 cm above the umbilicus in the midline.  I opened up   the fascia and peritoneum under direct vision and introduced the 12 mm Hassan   and  safe entry was evident.  I insufflated with carbon dioxide at 15 mmHg.    Three additional 5 mm ports were placed, one in the midline suprapubic area, one  in the left paramedian, and one in the right paramedian areas.  These were   placed under direct vision.  The eight ureter was   Identified  .I used the Voyant to cauterize and divide the right  periadnexal adhesions and the periappendiceal adhesions.      I used the Voyant to cauterize and divide the infundibulopelvic   vessels on the right.  Also, the upper aspect of the broad ligament was divided sequentially   to the cornua.  The proximal right fallopian tube and the right uteroovarian   ligament were cauterized and divided with the Voyant.  The right tube and ovary   were then placed in the posterior cul-de-sac.  I proceeded to lyse the   periappendiceal adhesions.  I then used the Voyant to cauterize and divide the   mesosalpinx sequentially to near the cecum.  I then used the Endo-GIA through   the 12 mm port and observed its placement across the base of the appendix with a  5 mm scope.  Once safe approximation was confirmed, the instrument was fired,   dividing the appendix  from the cecum, and the staple line was intact.  There was  good hemostasis.  I then used the Endo Catch to retrieve the appendix and the   right tube and ovary.  They were submitted together in formalin.  The pelvis   was thoroughly irrigated.  Periappendiceal area was hemostatic, as was the right  adnexal pedicles.  Photographs were obtained again.  Instruments were removed.    Carbon dioxide was allowed to escape, and the incisions were closed.  I used 0   Vicryl interrupted on the fascia at the umbilicus, and I closed the skin   incisions with interrupted subcuticular 4-0 Monocryl.  Dermabond was applied.    The Foley catheter and vaginal instruments were removed.  The patient was   awakened from anesthesia and taken to recovery room in good condition.    ESTIMATED BLOOD LOSS:   About 10 mL.    COMPLICATIONS:  There were no complications.    Clarene Critchley. Florian Buff, MD    PWS/namemt  D:  09/18/2018  3:51 P   T:  09/18/2018  10:13 P   TID:  [id number]  RECEIPT:    [id number]  cc:  [name], FNP

## 2018-09-18 NOTE — Interval H&P Note (Signed)
I have assessed the patient.    No interval change in H&P.    [facility] Patient Class Confirmed.

## 2018-09-18 NOTE — Discharge Instructions (Signed)
Patient Education     Bilateral Salpingo-Oophorectomy, Care After  This sheet gives you information about how to care for yourself after your procedure. Your health care provider may also give you more specific instructions. If you have problems or questions, contact your health care provider.  What can I expect after the procedure?  After the procedure, it is common to have:  ? Abdominal pain.  ? Some occasional vaginal bleeding (spotting).  ? Tiredness.  ? Symptoms of menopause, such as hot flashes, night sweats, or mood swings.  Follow these instructions at home:  Incision care    ? Keep your incision area and your bandage (dressing) clean and dry.  ? Follow instructions from your health care provider about how to take care of your incision. Make sure you:  ? Wash your hands with soap and water before you change your dressing. If soap and water are not available, use hand sanitizer.  ? Change your dressing as told by your health care provider.  ? Leave stitches (sutures), staples, skin glue, or adhesive strips in place. These skin closures may need to stay in place for 2 weeks or longer. If adhesive strip edges start to loosen and curl up, you may trim the loose edges. Do not remove adhesive strips completely unless your health care provider tells you to do that.  ? Check your incision area every day for signs of infection. Check for:  ? Redness, swelling, or pain.  ? Fluid or blood.  ? Warmth.  ? Pus or a bad smell.  Activity  ? Do not drive or use heavy machinery while taking prescription pain medicine.  ? Do not drive for 24 hours if you received a medicine to help you relax (sedative) during your procedure.  ? Take frequent, short walks throughout the day. Rest when you get tired. Ask your health care provider what activities are safe for you.  ? Avoid activity that requires great effort. Also, avoid heavy lifting. Do not lift anything that is heavier than 10 lbs. (4.5 kg), or the limit that your health care  provider tells you, until he or she says that it is safe to do so.  ? Do not douche, use tampons, or have sex until your health care provider approves.  General instructions  ? To prevent or treat constipation while you are taking prescription pain medicine, your health care provider may recommend that you:  ? Drink enough fluid to keep your urine clear or pale yellow.  ? Take over-the-counter or prescription medicines.  ? Eat foods that are high in fiber, such as fresh fruits and vegetables, whole grains, and beans.  ? Limit foods that are high in fat and processed sugars, such as fried and sweet foods.  ? Take over-the-counter and prescription medicines only as told by your health care provider.  ? Do not take baths, swim, or use a hot tub until your health care provider approves. Ask your health care provider if you can take showers. You may only be allowed to take sponge baths for bathing.  ? Wear compression stockings as told by your health care provider. These stockings help to prevent blood clots and reduce swelling in your legs.  ? Keep all follow-up visits as told by your health care provider. This is important.  Contact a health care provider if:  ? You have pain when you urinate.  ? You have pus or a bad smelling discharge coming from your vagina.  ? You   have redness, swelling, or pain around your incision.  ? You have fluid or blood coming from your incision.  ? Your incision feels warm to the touch.  ? You have pus or a bad smell coming from your incision.  ? You have a fever.  ? Your incision starts to break open.  ? You have pain in the abdomen, and it gets worse or does not get better when you take medicine.  ? You develop a rash.  ? You develop nausea and vomiting.  ? You feel lightheaded.  Get help right away if:  ? You develop pain in your chest or leg.  ? You become short of breath.  ? You faint.  ? You have increased bleeding from your vagina.  Summary  ? After the procedure, it is common to  have pain, bleeding in the vagina, and symptoms of menopause.  ? Follow instructions from your health care provider about how to take care of your incision.  ? Follow instructions from your health care provider about activities and restrictions.  ? Check your incision every day for signs of infection and report any symptoms to your health care provider.  This information is not intended to replace advice given to you by your health care provider. Make sure you discuss any questions you have with your health care provider.  Document Released: 10/22/2005 Document Revised: 01/31/2017 Document Reviewed: 11/26/2016  Elsevier Interactive Patient Education ? 2019 Elsevier Inc[employer]     Patient Education     Laparoscopic Appendectomy, Adult, Care After  Refer to this sheet in the next few weeks. These instructions provide you with information about caring for yourself after your procedure. Your health care provider may also give you more specific instructions. Your treatment has been planned according to current medical practices, but problems sometimes occur. Call your health care provider if you have any problems or questions after your procedure.  What can I expect after the procedure?  After the procedure, it is common to have:  ? A decrease in your energy level.  ? Mild pain in the area where the surgical cuts (incisions) were made.  ? Constipation. This can be caused by pain medicine and a decrease in your activity.  Follow these instructions at home:  Medicines  ? Take over-the-counter and prescription medicines only as told by your health care provider.  ? Do not drive for 24 hours if you received a sedative.  ? Do not drive or operate heavy machinery while taking prescription pain medicine.  ? If you were prescribed an antibiotic medicine, take it as told by your health care provider. Do not stop taking the antibiotic even if you start to feel better.  Activity    ? For 3 weeks or as long as told by your health care  provider:  ? Do not lift anything that is heavier than 10 pounds (4.5 kg).  ? Do not play contact sports.  ? Gradually return to your normal activities. Ask your health care provider what activities are safe for you.  Bathing  ? Keep your incisions clean and dry. Clean them as often as told by your health care provider:  ? Gently wash the incisions with soap and water.  ? Rinse the incisions with water to remove all soap.  ? Pat the incisions dry with a clean towel. Do not rub the incisions.  ? You may take showers after 48 hours.  ? Do not take baths, swim, or use  hot tubs for 2 weeks or as told by your health care provider.  Incision care    ? Follow instructions from your healthcare provider about how to take care of your incisions. Make sure you:  ? Wash your hands with soap and water before you change your bandage (dressing). If soap and water are not available, use hand sanitizer.  ? Change your dressing as told by your health care provider.  ? Leave stitches (sutures), skin glue, or adhesive strips in place. These skin closures may need to stay in place for 2 weeks or longer. If adhesive strip edges start to loosen and curl up, you may trim the loose edges. Do not remove adhesive strips completely unless your health care provider tells you to do that.  ? Check your incision areas every day for signs of infection. Check for:  ? More redness, swelling, or pain.  ? More fluid or blood.  ? Warmth.  ? Pus or a bad smell.  Other Instructions  ? If you were sent home with a drain, follow instructions from your health care provider about how to care for it.  ? Take deep breaths. This helps to prevent your lungs from becoming inflamed.  ? To relieve and prevent constipation:  ? Drink plenty of fluids.  ? Eat plenty of fruits and vegetables.  ? Keep all follow-up visits as told by your health care provider. This is important.  Contact a health care provider if:  ? You have more redness, swelling, or pain around an  incision.  ? You have more fluid or blood coming from an incision.  ? Your incision feels warm to the touch.  ? You have pus or a bad smell coming from an incision or dressing.  ? Your incision edges break open after your sutures have been removed.  ? You have increasing pain in your shoulders.  ? You feel dizzy or you faint.  ? You develop shortness of breath.  ? You keep feeling nauseous or vomiting.  ? You have diarrhea or you cannot control your bowel functions.  ? You lose your appetite.  ? You develop swelling or pain in your legs.  Get help right away if:  ? You have a fever.  ? You develop a rash.  ? You have difficulty breathing.  ? You have sharp pains in your chest.  This information is not intended to replace advice given to you by your health care provider. Make sure you discuss any questions you have with your health care provider.  Document Released: 10/22/2005 Document Revised: 06/06/2017 Document Reviewed: 04/11/2015  Elsevier Interactive Patient Education ? 2019 Elsevier Inc.     Patient Education     General Anesthesia, Adult, Care After  This sheet gives you information about how to care for yourself after your procedure. Your health care provider may also give you more specific instructions. If you have problems or questions, contact your health care provider.  What can I expect after the procedure?  After the procedure, the following side effects are common:  ? Pain or discomfort at the IV site.  ? Nausea.  ? Vomiting.  ? Sore throat.  ? Trouble concentrating.  ? Feeling cold or chills.  ? Weak or tired.  ? Sleepiness and fatigue.  ? Soreness and body aches. These side effects can affect parts of the body that were not involved in surgery.  Follow these instructions at home:    For at least 24  hours after the procedure:  ? Have a responsible adult stay with you. It is important to have someone help care for you until you are awake and alert.  ? Rest as needed.  ? Do not:  ? Participate in  activities in which you could fall or become injured.  ? Drive.  ? Use heavy machinery.  ? Drink alcohol.  ? Take sleeping pills or medicines that cause drowsiness.  ? Make important decisions or sign legal documents.  ? Take care of children on your own.  Eating and drinking  ? Follow any instructions from your health care provider about eating or drinking restrictions.  ? When you feel hungry, start by eating small amounts of foods that are soft and easy to digest (bland), such as toast. Gradually return to your regular diet.  ? Drink enough fluid to keep your urine pale yellow.  ? If you vomit, rehydrate by drinking water, juice, or clear broth.  General instructions  ? If you have sleep apnea, surgery and certain medicines can increase your risk for breathing problems. Follow instructions from your health care provider about wearing your sleep device:  ? Anytime you are sleeping, including during daytime naps.  ? While taking prescription pain medicines, sleeping medicines, or medicines that make you drowsy.  ? Return to your normal activities as told by your health care provider. Ask your health care provider what activities are safe for you.  ? Take over-the-counter and prescription medicines only as told by your health care provider.  ? If you smoke, do not smoke without supervision.  ? Keep all follow-up visits as told by your health care provider. This is important.  Contact a health care provider if:  ? You have nausea or vomiting that does not get better with medicine.  ? You cannot eat or drink without vomiting.  ? You have pain that does not get better with medicine.  ? You are unable to pass urine.  ? You develop a skin rash.  ? You have a fever.  ? You have redness around your IV site that gets worse.  Get help right away if:  ? You have difficulty breathing.  ? You have chest pain.  ? You have blood in your urine or stool, or you vomit blood.  Summary  ? After the procedure, it is common to have a  sore throat or nausea. It is also common to feel tired.  ? Have a responsible adult stay with you for the first 24 hours after general anesthesia. It is important to have someone help care for you until you are awake and alert.  ? When you feel hungry, start by eating small amounts of foods that are soft and easy to digest (bland), such as toast. Gradually return to your regular diet.  ? Drink enough fluid to keep your urine pale yellow.  ? Return to your normal activities as told by your health care provider. Ask your health care provider what activities are safe for you.  This information is not intended to replace advice given to you by your health care provider. Make sure you discuss any questions you have with your health care provider.  Document Released: 01/28/2001 Document Revised: 06/07/2017 Document Reviewed: 06/07/2017  Elsevier Interactive Patient Education ? 2019 Elsevier Inc.

## 2018-09-18 NOTE — OR Nursing (Signed)
No vag flow,dsg d and I.no pain.

## 2018-09-18 NOTE — OR Nursing (Signed)
Patient AOx4. Vital signs stable. Tolerating prescribed diet and denies nausea. Pain well controlled. Discharge instructions and/or medications reviewed with patient and family and a hard copy provided. Patient verbalizes understanding. All questions have been answered. Patient discharged to home via wheelchair and escort. Husband to drive patient home.

## 2018-09-18 NOTE — OR Nursing (Signed)
Pt arrived to Phase II from PACU, alert & oriented x4. Vital signs WNL. Pt denies nausea/vomiting at this time. Patient denies pain at this time. Incision sites x4 with skin glue clean/dry/intact. Tolerating PO. Pt resting comfortably, call light within reach. Family at the bedside. Will continue to monitor.

## 2018-09-18 NOTE — Anesthesia Post-Procedure Evaluation (Signed)
Patient Name: Kerry Allen  Procedures performed: Procedure(s):  LAPAROSCOPY; RIGHTSALPINGO-OOPHORECTOMY; APPENDECTOMY;  LYSIS OF PERIADENEXAL ADHESIONS    Last Vitals:   Vitals Value Taken Time   BP 122/72 09/18/2018  4:21 PM   Pulse 85 09/18/2018  4:21 PM   Resp 14 09/18/2018  4:21 PM   SpO2 98 % 09/18/2018  4:21 PM   Temp 36.2 ?C 09/18/2018  4:21 PM       Planned Anesthesia Type: general  Final Anesthesia Type: No value filed.  Patients Current Location: PACU  Level of Consciousness: awake, alert and responds appropriately  Post Procedure Pain:adequate analgesia  Airway: Patent  Respiratory Status: nonlabor ventilation, unassisted and spontaneous ventilation  Cardio Status: hemodynamically stable  Hydration: adequately hydrated  PONV prophylaxis Ordered  PONV? NO  no anesthesia complication,       planned opioid use           The patient was able to participate in the post op evaluation    Comments:      Hubbard Hartshorn, MD  4:41 PM

## 2018-09-19 MED ORDER — sodium chloride 0.9 % (NS) syringe 5 mL
INTRAMUSCULAR | Status: DC | PRN
Start: 2018-09-19 — End: 2018-09-18

## 2018-09-19 MED ORDER — oxyCODONE-acetaminophen (PERCOCET) 7.5-325 mg per tablet 1 tablet
7.5-325 | ORAL | Status: DC | PRN
Start: 2018-09-19 — End: 2018-09-18

## 2018-09-19 MED ORDER — sodium chloride 0.9 % (NS) syringe 5-10 mL
Freq: Three times a day (TID) | INTRAMUSCULAR | Status: DC
Start: 2018-09-19 — End: 2018-09-18

## 2018-09-19 MED ORDER — HYDROmorphone (PF) (DILAUDID) injection 0.4-0.8 mg
0.5 | INTRAMUSCULAR | Status: DC | PRN
Start: 2018-09-19 — End: 2018-09-18

## 2018-09-19 MED ORDER — naloxone (NARCAN) injection 0.08 mg
0.4 | INTRAMUSCULAR | Status: DC | PRN
Start: 2018-09-19 — End: 2018-09-18

## 2018-09-24 DIAGNOSIS — D509 Iron deficiency anemia, unspecified: Secondary | ICD-10-CM | POA: Diagnosis not present

## 2018-09-24 DIAGNOSIS — R5382 Chronic fatigue, unspecified: Secondary | ICD-10-CM | POA: Diagnosis not present

## 2018-09-24 DIAGNOSIS — E538 Deficiency of other specified B group vitamins: Secondary | ICD-10-CM | POA: Diagnosis not present

## 2018-10-10 NOTE — Progress Notes (Signed)
New Employee Checklist            Employment Date (MM/DD/YY): 11/10/18     Employment Location []  [employer]  [x]  [employer]  []  [employer]    Drug Screen Result [x]  Pass        Physical [x]  Pass  []  Fail  []  Medical Hold        Date EH Cleared Candidate with HR/TAT 10/20/18     TB Risk Assessment and Symptom Review Form [x]  Completed    QuantiFERON Gold Testing  [x]  Negative        []  Positive  []  Further Testing Required   Tdap Status [x]  Given             []  Declined  []  Documentation Provided    Hepatitis B []  Immune          [x]  Given  [x]  Non-Immune  []  Declined    Measles [x]  Immune          []  Given  []  Non-Immune  []  Declined    Mumps [x]  Immune          []  Given  []  Non-Immune  []  Declined    Rubella [x]  Immune          []  Given  []  Non-Immune  []  Declined    Varicella []  Immune          [x]  Given  [x]  Non-Immune  []  Declined    OSHA Questionaire []  Completed      [x]  N/A    FIT Testing []  Completed      [x]  N/A    RFT Sticker []  Given              [x]  N/A    Laser Use (Baseline Eye Exam) []  Completed      [x]  N/A    Date Reviewed 11/17/18 kc

## 2018-10-14 ENCOUNTER — Institutional Professional Consult (permissible substitution): Admit: 2018-10-14 | Discharge: 2018-10-14 | Attending: Family | Primary: Family

## 2018-10-14 DIAGNOSIS — Z0283 Encounter for blood-alcohol and blood-drug test: Secondary | ICD-10-CM

## 2018-10-14 LAB — POCT URINE
Bilirubin (Urine): NEGATIVE mg/dL
Blood (urine): NEGATIVE mg/dL
Glucose (Urine): NEGATIVE mg/dL
Leukocyte (Urine): NEGATIVE mg/dL
Nitrites (Urine): NEGATIVE mg/dL
Protein (Urine): NEGATIVE mg/dL
Specific Gravity (Urine): 1.025 (ref 1.000–1.030)
Urobilinogen (Urine): 0.2 mg/dL
pH (Urine): 5 (ref 5.0–8.5)

## 2018-10-14 NOTE — Progress Notes (Signed)
Please see paper chart for results.

## 2018-10-17 ENCOUNTER — Other Ambulatory Visit: Admit: 2018-10-18 | Discharge: 2018-10-18 | Primary: Family

## 2018-10-17 ENCOUNTER — Encounter: Admit: 2018-10-17 | Discharge: 2018-10-20 | Attending: Family | Primary: Family

## 2018-10-17 DIAGNOSIS — I358 Other nonrheumatic aortic valve disorders: Secondary | ICD-10-CM | POA: Diagnosis not present

## 2018-10-17 DIAGNOSIS — R5383 Other fatigue: Secondary | ICD-10-CM | POA: Diagnosis not present

## 2018-10-17 DIAGNOSIS — R5381 Other malaise: Secondary | ICD-10-CM | POA: Diagnosis not present

## 2018-10-17 DIAGNOSIS — Z021 Encounter for pre-employment examination: Secondary | ICD-10-CM

## 2018-10-17 NOTE — Progress Notes (Signed)
Medical Exam    No hernia exam indicated.      AMB Vitals - 1 value per visit 10/14/2018   SYSTOLIC 130   DIASTOLIC 74   PULSE 89   TEMPERATURE    RESPIRATIONS    VISIT REPORT    Height 62 in   Weight 73.936 kg   Weight 163 lb   BMI (Calculated) 29.8 kg/m2   BODY SURFACE AREA 1.8 m2   SPO2      Vision and Whisper Test 10/14/2018   Uncorrected Right Eye Near 20/20   Uncorrected Left Eye Near 20/20   Uncorrected Both Eye Near 20/20   Corrected Right Eye Far 20/13   Corrected Left Eye Far 20/13   Corrected Both Eye Far 20/13   For the Right ear, record distance (in feet) from driver at which a forced whispered voice can first be heard pass   For the Left ear, record distance (in feet) from driver at which a forced whispered voice can first be heard pass       Physical Exam  Constitutional:       Appearance: Normal appearance. She is well-developed.   HENT:      Head: Normocephalic.      Right Ear: External ear normal.      Left Ear: External ear normal.      Nose: Nose normal.   Eyes:      Conjunctiva/sclera: Conjunctivae normal.      Pupils: Pupils are equal, round, and reactive to light.   Neck:      Musculoskeletal: Full passive range of motion without pain, normal range of motion and neck supple.   Cardiovascular:      Rate and Rhythm: Normal rate and regular rhythm.      Heart sounds: Normal heart sounds.   Pulmonary:      Effort: Pulmonary effort is normal.      Breath sounds: Normal breath sounds.   Abdominal:      General: Bowel sounds are normal.      Palpations: Abdomen is soft.   Musculoskeletal: Normal range of motion.   Skin:     General: Skin is warm and dry.   Neurological:      Mental Status: She is alert and oriented to person, place, and time.      Deep Tendon Reflexes: Reflexes are normal and symmetric.   Psychiatric:         Behavior: Behavior normal.         Thought Content: Thought content normal.         Judgment: Judgment normal.            Medical Provider Assessment:    1. Medication issues:  Yes - Penicillin causes rash.      2. Musculoskeletal issues: No    3. Work related injuries: No    4. Psychological/Mental Health issues: No    5. Work restrictions or accommodations: No    6. Medical issues identified from this visit: No    Medically capable to perform the job (no restrictions)    See scanned documents for related paper based physical documents.

## 2018-10-18 LAB — MUMPS ANTIBODY, IGG
Mumps IgG Antibody Index: 15.6 AU/mL
Mumps IgG Antibody: POSITIVE

## 2018-10-18 LAB — QTB GOLD PLUS
Mitogen Minus NIL: >10 IU/mL
NIL: 0.02 IU/mL
QuantiFERON-TB Gold: NEGATIVE
TB Antigen Minus NIL: 0 IU/mL
TB2 Antigen Minus NIL: 0 IU/mL

## 2018-10-18 LAB — VARICELLA ZOSTER ANTIBODY, IGG: Varicella IgG Antibody Index: 15

## 2018-10-18 LAB — RUBEOLA ANTIBODY IGG: Rubeola IgG Antibody (measles) Index: 44.7 AU/mL

## 2018-10-18 LAB — RUBELLA ANTIBODY, IGG: Rubella, IgG: 15 IU/mL

## 2018-10-18 LAB — HEPATITIS B SURFACE ANTIBODY: Hepatitis B Surface Antibody Interpretation: NON-IMMUNE/NOT IMMUNE — AB

## 2018-10-31 DIAGNOSIS — E039 Hypothyroidism, unspecified: Secondary | ICD-10-CM | POA: Diagnosis not present

## 2018-10-31 DIAGNOSIS — N951 Menopausal and female climacteric states: Secondary | ICD-10-CM | POA: Diagnosis not present

## 2018-10-31 DIAGNOSIS — R5383 Other fatigue: Secondary | ICD-10-CM | POA: Diagnosis not present

## 2018-11-07 DIAGNOSIS — R0989 Other specified symptoms and signs involving the circulatory and respiratory systems: Secondary | ICD-10-CM | POA: Diagnosis not present

## 2018-11-07 DIAGNOSIS — R011 Cardiac murmur, unspecified: Secondary | ICD-10-CM | POA: Diagnosis not present

## 2018-11-11 NOTE — Telephone Encounter (Addendum)
-----   Message from [name], FNP sent at 11/11/2018  4:18 PM PST -----  Needs a F/U appt due to hyperlipidemia      Recent Results (from the past 168 hour(s))   POC Biometric Screening    Collection Time: 11/11/18  4:03 PM   Result Value Ref Range    Total Cholesterol 242 (A) 200 mg/dL    HDL 57 (A) 60 mg/dL    Non-HDL 784 (A) 696 mg/dL    TC/HDL Ratio 4.2 5.0    Fasting Glucose      Non-fasting Glucose 78 139 mg/dL

## 2018-11-12 LAB — POC HEALTH PROMOTION BIOMETRIC SCREEN
HDL: 57 mg/dL — AB (ref 60–?)
Non-HDL: 185 mg/dL — AB (ref ?–130)
Non-fasting Glucose: 78 mg/dL (ref ?–139)
TC/HDL Ratio: 4.2 (ref ?–5.0)
Total Cholesterol: 242 mg/dL — AB (ref ?–200)

## 2018-11-12 NOTE — Telephone Encounter (Signed)
Attempted to call patient.  No answer.  Left message and sent my chart message for patient to call back.  Needs to schedule an appointment to review lab results from Biometric Screening (cholesterol).

## 2018-11-13 ENCOUNTER — Encounter: Admit: 2018-11-13 | Discharge: 2018-11-13 | Attending: Family | Primary: Family

## 2018-11-13 DIAGNOSIS — Z23 Encounter for immunization: Secondary | ICD-10-CM

## 2018-11-13 NOTE — Progress Notes (Signed)
Immunizations Administered     Name Date Dose VIS Date Route    Flu Quadrivalent Injectable (PF) 11/13/2018 0.5 mL 06/19/2018 Intramuscular    Site: Right deltoid    Given By: Laurine Blazer, MA    Documented By: Laurine Blazer, MA    Manufacturer: [employer]    Lot: [id number]    NDC: 60454098119    Expiration Date: 05/05/2019    HEP-B ADULT 11/13/2018 1 mL 08/16/2017 Intramuscular    Site: Left deltoid    Given By: Laurine Blazer, MA    Documented By: Laurine Blazer, MA    Manufacturer: [employer]    Lot: [phone]    NDC: [id number]    Expiration Date: 09/16/2020    Tdap 11/13/2018 0.5 mL 12/29/2013 Intramuscular    Site: Left deltoid    Given By: Laurine Blazer, MA    Documented By: Laurine Blazer, MA    Manufacturer: [employer]    Lot: [id number]    NDC: [id number]    Expiration Date: 10/07/2020    Varicella 11/13/2018 0.5 mL 01/16/2007 Subcutaneous    Site: Right arm    Given By: Laurine Blazer, MA    Documented By: Laurine Blazer, MA    Manufacturer: [facility] [employer] D    Lot: [id number]    NDC: [id number]    Expiration Date: 05/04/2020

## 2018-11-14 DIAGNOSIS — R5381 Other malaise: Secondary | ICD-10-CM | POA: Diagnosis not present

## 2018-11-14 DIAGNOSIS — I358 Other nonrheumatic aortic valve disorders: Secondary | ICD-10-CM | POA: Diagnosis not present

## 2018-11-14 DIAGNOSIS — R5383 Other fatigue: Secondary | ICD-10-CM | POA: Diagnosis not present

## 2018-11-18 MED ORDER — liothyronine (CYTOMEL) 5 MCG tablet
5 | ORAL_TABLET | Freq: Every day | ORAL | 0 refills | 90.00000 days | Status: DC
Start: 2018-11-18 — End: 2019-01-07

## 2018-11-18 NOTE — Telephone Encounter (Signed)
From: [name]  To: [name], FNP  Sent: 11/18/2018 5:51 AM PST  Subject: Medication Question    [name],    I need to get a refill of Liothyronine SOD 5 mcg tablets. I have only 5 left. Dr. Osa Craver wont approve anymore since he is not working with me with my thyroid anymore and gave the care to you. Could you please refill and send the Rx to [facility].     Thank you,    Kerry Allen

## 2018-11-18 NOTE — Telephone Encounter (Signed)
Ok to refill 1 time until she gets her lab work done and schedules and appointment?

## 2018-11-18 NOTE — Telephone Encounter (Signed)
Yes

## 2018-11-26 ENCOUNTER — Ambulatory Visit (HOSPITAL_COMMUNITY): Payer: 59 | Attending: Internal Medicine

## 2018-11-26 DIAGNOSIS — R002 Palpitations: Secondary | ICD-10-CM | POA: Insufficient documentation

## 2018-11-26 DIAGNOSIS — R0609 Other forms of dyspnea: Secondary | ICD-10-CM | POA: Insufficient documentation

## 2018-11-26 DIAGNOSIS — R5381 Other malaise: Secondary | ICD-10-CM | POA: Diagnosis not present

## 2018-11-26 DIAGNOSIS — R5383 Other fatigue: Secondary | ICD-10-CM | POA: Insufficient documentation

## 2018-11-26 DIAGNOSIS — R0600 Dyspnea, unspecified: Secondary | ICD-10-CM | POA: Diagnosis present

## 2018-11-27 ENCOUNTER — Other Ambulatory Visit (HOSPITAL_COMMUNITY): Payer: Self-pay | Admitting: Cardiology

## 2018-11-27 ENCOUNTER — Other Ambulatory Visit (HOSPITAL_COMMUNITY): Payer: Self-pay | Admitting: *Deleted

## 2018-11-27 DIAGNOSIS — R0600 Dyspnea, unspecified: Secondary | ICD-10-CM

## 2018-12-18 ENCOUNTER — Encounter: Attending: Family | Primary: Family

## 2018-12-19 ENCOUNTER — Encounter: Admit: 2018-12-19 | Discharge: 2018-12-19 | Attending: Family | Primary: Family

## 2018-12-19 DIAGNOSIS — Z23 Encounter for immunization: Secondary | ICD-10-CM

## 2018-12-19 NOTE — Progress Notes (Signed)
Immunizations Administered     Name Date Dose VIS Date Route    HEP-B ADULT 12/19/2018 1 mL 08/16/2017 Intramuscular    Site: Left deltoid    Given By: Burnis Kingfisher, FNP    Documented By: Burnis Kingfisher, FNP    Manufacturer: [employer]    Lot: [id number]    Expiration Date: 03/24/2021    Varicella 12/19/2018 0.5 mL 01/16/2007 Subcutaneous    Site: Right arm    Given By: Burnis Kingfisher, FNP    Documented By: Burnis Kingfisher, FNP    Manufacturer: [employer] & D    Lot: [id number]    Expiration Date: 05/04/2020

## 2018-12-24 ENCOUNTER — Other Ambulatory Visit: Admit: 2018-12-24 | Discharge: 2018-12-24 | Payer: PRIVATE HEALTH INSURANCE | Primary: Family

## 2018-12-24 DIAGNOSIS — Z0000 Encounter for general adult medical examination without abnormal findings: Secondary | ICD-10-CM

## 2018-12-24 LAB — CBC WITH AUTO DIFFERENTIAL
Basophils %: 1 % (ref 0–2)
Basophils, Absolute: 0.1 10*3/ÂµL (ref 0.0–0.2)
Eosinophils %: 5 % (ref 0–7)
Eosinophils, Absolute: 0.3 10*3/ÂµL (ref 0.0–0.7)
HCT: 39.5 % (ref 37.0–48.0)
Hemoglobin: 13.5 g/dL (ref 12.0–16.0)
Lymphocytes %: 22 % — ABNORMAL LOW (ref 25–45)
Lymphocytes, Absolute: 1.4 10*3/ÂµL (ref 1.1–4.3)
MCH: 30.4 pg (ref 27.0–34.0)
MCHC: 34.3 g/dL (ref 32.0–36.0)
MCV: 88.9 fL (ref 81.0–99.0)
MPV: 9.1 fL (ref 7.4–10.4)
Monocytes %: 6 % (ref 0–12)
Monocytes, Absolute: 0.4 10*3/ÂµL (ref 0.0–1.2)
Neutrophils %: 66 % (ref 35–70)
Neutrophils, Absolute: 4.3 10*3/ÂµL (ref 1.6–7.3)
Platelet Count: 339 10*3/ÂµL (ref 150–400)
RBC: 4.44 10*6/ÂµL (ref 4.20–5.40)
RDW: 13.9 % (ref 11.5–14.5)
WBC: 6.5 10*3/ÂµL (ref 4.8–10.8)

## 2018-12-24 LAB — COMPREHENSIVE METABOLIC PANEL
ALT - Alanine Aminotransferase: 12 IU/L (ref 7–52)
AST - Aspartate Aminotransferase: 12 IU/L (ref 8–39)
Albumin/Globulin Ratio: 1.6 (ref >0.9)
Albumin: 4.2 g/dL (ref 3.5–5.0)
Alkaline Phosphatase: 59 IU/L (ref 34–104)
Anion Gap: 8 mmol/L (ref 4–13)
BUN: 15 mg/dL (ref 6–23)
Bilirubin Total: 0.4 mg/dL (ref 0.3–1.2)
CO2 - Carbon Dioxide: 28 mmol/L (ref 21–31)
Calcium: 9 mg/dL (ref 8.6–10.3)
Chloride: 104 mmol/L (ref 98–111)
Creatinine: 0.88 mg/dL (ref 0.55–1.10)
GFR Estimated Female: >60 mL/min/{1.73_m2} (ref >=60)
Globulin: 2.6 g/dL (ref 2.2–3.7)
Glucose: 89 mg/dL (ref 80–99)
Potassium: 4.2 mmol/L (ref 3.5–5.1)
Protein Total: 6.8 g/dL (ref 6.0–8.0)
Sodium: 140 mmol/L (ref 135–143)

## 2018-12-24 LAB — CORONARY RISK LIPID PANEL REFLEX DIRECT LDL
Cholesterol, HDL: 50 mg/dL (ref >=40)
Cholesterol: 180 mg/dL (ref <200)
LDL Calculated: 118 mg/dL — ABNORMAL HIGH (ref <100)
Triglyceride: 61 mg/dL (ref 30–149)

## 2018-12-24 LAB — T4, FREE: T4, Free: 0.7 ng/dL (ref 0.6–1.2)

## 2018-12-24 LAB — TSH: TSH - Thyroid Stimulating Hormone: 28.81 u[IU]/mL — ABNORMAL HIGH (ref 0.45–5.33)

## 2018-12-24 NOTE — Telephone Encounter (Signed)
Attempted to call patient.  No answer.  Left message for patient to call back.  Needs to schedule an appointment to discuss lab results.

## 2018-12-24 NOTE — Telephone Encounter (Addendum)
-----   Message from Kerry Sias, FNP sent at 12/24/2018  3:59 PM PST -----  Abnormal lab - needs appointment      Recent Results (from the past 48 hour(s))   Coronary Risk Lipid Panel w/reflex Direct LDL if triglycerides >400 mg/dL -Routine    Collection Time: 12/24/18  7:14 AM   Result Value Ref Range    Cholesterol 180 <200 mg/dL    Cholesterol, HDL 50 >=40 mg/dL    Triglyceride 61 30 - 149 mg/dL    LDL Calculated 161 (H) <100 mg/dL   Comprehensive Metabolic Panel -Routine    Collection Time: 12/24/18  7:14 AM   Result Value Ref Range    Sodium 140 135 - 143 mmol/L    Potassium 4.2 3.5 - 5.1 mmol/L    Chloride 104 98 - 111 mmol/L    CO2 - Carbon Dioxide 28 21 - 31 mmol/L    Glucose 89 80 - 99 mg/dL    BUN 15 6 - 23 mg/dL    Creatinine 0.96 0.45 - 1.10 mg/dL    Calcium 9.0 8.6 - 40.9 mg/dL    AST - Aspartate Aminotransferase 12 8 - 39 IU/L    ALT - Alanine Aminotransferase 12 7 - 52 IU/L    Alkaline Phosphatase 59 34 - 104 IU/L    Bilirubin Total 0.4 0.3 - 1.2 mg/dL    Protein Total 6.8 6.0 - 8.0 g/dL    Albumin 4.2 3.5 - 5.0 g/dL    Globulin 2.6 2.2 - 3.7 g/dL    Albumin/Globulin Ratio 1.6 >0.9    Anion Gap 8 4 - 13 mmol/L    GFR Estimated Female >60 >=60 mL/min/1.10m*2    GFR Additional Info     CBC with Auto Differential -Routine    Collection Time: 12/24/18  7:14 AM   Result Value Ref Range    WBC 6.5 4.8 - 10.8 10*3/?L    RBC 4.44 4.20 - 5.40 10*6/?L    Hemoglobin 13.5 12.0 - 16.0 g/dL    HCT 81.1 91.4 - 78.2 %    MCV 88.9 81.0 - 99.0 fL    MCH 30.4 27.0 - 34.0 pg    MCHC 34.3 32.0 - 36.0 g/dL    RDW 95.6 21.3 - 08.6 %    Platelet Count 339 150 - 400 10*3/?L    MPV 9.1 7.4 - 10.4 fL    Neutrophils % 66 35 - 70 %    Lymphocytes % 22 (L) 25 - 45 %    Monocytes % 6 0 - 12 %    Eosinophils % 5 0 - 7 %    Basophils % 1 0 - 2 %    Neutrophils, Absolute 4.3 1.6 - 7.3 10*3/?L    Lymphocytes, Absolute 1.4 1.1 - 4.3 10*3/?L    Monocytes, Absolute 0.4 0.0 - 1.2 10*3/?L    Eosinophils, Absolute 0.3 0.0 - 0.7 10*3/?L    Basophils, Absolute 0.1 0.0 - 0.2 10*3/?L    Differential Type Automated Differential    Thyroid Stimulating Hormone -Routine    Collection Time: 12/24/18  7:14 AM   Result Value Ref Range    TSH - Thyroid Stimulating Hormone 28.81 (H) 0.45 - 5.33 ?IU/mL   T4, Free -Routine    Collection Time: 12/24/18  7:14 AM   Result Value Ref Range    T4, Free 0.7 0.6 - 1.2 ng/dL

## 2018-12-25 NOTE — Telephone Encounter (Signed)
Attempted to call patient again.  No answer.  Left another message for patient to call back.  Needs to schedule an appointment to discuss lab results.

## 2018-12-25 NOTE — Telephone Encounter (Signed)
Scheduled patient for 01/06/2019 at 8:30 am.  She declined an earlier appointment.

## 2019-01-06 ENCOUNTER — Ambulatory Visit: Admit: 2019-01-06 | Payer: PRIVATE HEALTH INSURANCE | Attending: Family | Primary: Family

## 2019-01-06 DIAGNOSIS — E039 Hypothyroidism, unspecified: Secondary | ICD-10-CM

## 2019-01-06 MED ORDER — levothyroxine (SYNTHROID) 175 MCG tablet
175 | ORAL_TABLET | Freq: Every day | ORAL | 0 refills | Status: DC
Start: 2019-01-06 — End: 2019-04-27
  Filled 2019-01-08: qty 90, 90d supply, fill #0

## 2019-01-06 NOTE — Progress Notes (Signed)
Kerry Allen is a 43 y.o. female seen today for Lab Results  .    Patient is alone. The Patient is the medical decision(s) maker attending today's appointment.    ASSESSMENT AND PLAN:  Problem List Items Addressed This Visit        Active Problems    Hypothyroidism - Primary (Chronic)    Relevant Medications    levothyroxine (SYNTHROID) 175 MCG tablet    Other Relevant Orders    [place] [facility]      Other Visit Diagnoses     Screening for depression        Hyperthyroidism        Relevant Medications    levothyroxine (SYNTHROID) 175 MCG tablet    Fatigue, unspecified type        Relevant Orders    Iron & Total Iron Binding Capacity -Routine    Ferritin -Routine          Plan:     Referral to Endocrinology for further evaluation.     Increase dose of levothyroxine to 175 mcg Q Am.   Check iron levels due to easy bruising, feeilng cold and symptoms of iron deficiency.             Support for the above plan obtained during this visit:    HPI    Lab Results  Pt here to discuss the following lab results as listed below. Pt educated regarding the findings and treatment plan developed.     hypothyroidism  PT currently taking 200 mcg of levothyroxine and TSH is in   Easy bruising. Cold all the time.     Depression Screening Questionnaire 01/06/2019 04/19/2017   During the past two weeks, have you been bothered by little interest or pleasure in doing things? Not at all Not at all   During the past two weeks, have you been bothered by feeling down, depressed, or hopeless? Not at all Not at all   Little interest or pleasure in doing things Not at all Not at all   Feeling down, depressed, or hopeless Not at all Not at all   PHQ-9 Score 0 0       GAD7 01/06/2019   1. Feeling nervous, anxious or on edge Several days   2. Not being able to stop or control worrying Several days   3. Worrying too much about different things Several days   4. Trouble relaxing Several days   5. Being so restless that it is hard to sit still  Not at all   6. Becoming easily annoyed or irritable Not at all   7. Feeling afraid as if something awful might happen Not at all   GAD7 Total 4       MOOD DISORDER                                 Has there ever been a period of time when you were not your usual self and... 01/06/2019   ...you felt so good or so hyper that other people thought you were not your normal self or you were so hyper that you got into trouble? 0   ...you were so irritable that you shouted at people or started fights or arguments? 0   ...you felt much more self-confident than usual? 0   ...you got much less sleep than usual and found that you didn't really miss it?  1   ...you were more talkative or spoke much faster than usual? 0   ...thoughts raced through your head or you couldn't slow your mind down? 0   ...you were so easily distracted by things around you that you had trouble concentrating or staying on track? 0   ...you had more energy than usual? 0   ...you were much more active or did many more things than usual? 0   ...you were much more social or outgoing than usual, for example, you telephoned friends in the middle of the night? 0   ...you were much more interested in sex than usual? 0   ...you did things that were unusual for you or that other people might have thought were excessive, foolish, or risky? 0   ...spending money got you or your family in trouble? 0   Mood 1       ALLERGIES:  Allergies   Allergen Reactions   . Penicillins Rash     When she was a child       Outpatient Medications Marked as Taking for the 01/06/19 encounter (Office Visit) with [name], FNP   Medication Sig Dispense Refill   . B-complex vitamin tablet Take 1 tablet by mouth daily. B-Right; Glo Herring     . cholecalciferol, vitamin D3, (VITAMIN D3) 5,000 unit Tab Take by mouth.     . dextroamphetamine-amphetamine (ADDERALL) 10 mg tablet Take 10 mg at 8 AM and between 12-2PM. 56 tablet 0   . IBU 600 mg tablet Take 600 mg by mouth every 6 hours as  needed. for pain  2   . INOSITOL, BULK, MISC by Miscellaneous route.     Marland Kitchen levomefolate calcium (L-METHYLFOLATE ORAL) Take 5 mg by mouth.     . levothyroxine (SYNTHROID) 175 MCG tablet Take 1 tablet by mouth daily. 90 tablet 0   . liothyronine (CYTOMEL) 5 MCG tablet Take 1 tablet by mouth daily. 30 tablet 0   . [DISCONTINUED] levothyroxine (SYNTHROID, LEVOTHROID) 125 MCG tablet TAKE 1 TABLET BY MOUTH EVERY DAY 15 tablet 0       VITALS:  Vitals:    01/06/19 0821   BP: 118/76   BP Location: Left arm   Patient Position: Sitting   Pulse: 96   Resp: 16   Temp: 36.9 ?C (98.5 ?F)   TempSrc: Oral   SpO2: 100%   Weight: 167 lb (75.8 kg)   Height: 5' 2 (1.575 m)     Body mass index is 30.54 kg/m?Marland Kitchen  No LMP recorded. Patient has had an ablation.    Ideal body weight: 110 lb 7.2 oz (50.1 kg)  Adjusted ideal body weight: 133 lb 1.1 oz (60.4 kg)     Wt Readings from Last 3 Encounters:   01/06/19 167 lb (75.8 kg)   11/11/18 167 lb (75.8 kg)   10/14/18 163 lb (73.9 kg)       Review of Systems   Constitutional: Positive for fatigue. Negative for activity change, appetite change, chills and fever.   Respiratory: Negative for cough, chest tightness and shortness of breath.    Cardiovascular: Negative for chest pain, palpitations and leg swelling.   Gastrointestinal: Positive for constipation. Negative for abdominal pain, diarrhea and nausea.   Skin: Negative for rash and wound.   Neurological: Negative for dizziness and headaches.   Psychiatric/Behavioral: Positive for sleep disturbance. Negative for agitation, decreased concentration, dysphoric mood, self-injury and suicidal ideas. The patient is nervous/anxious.  Also see HPI for ROS    Physical Exam  Constitutional:       Appearance: She is well-developed.      Comments: Does not appear to feel well   HENT:      Right Ear: Hearing normal. Tympanic membrane is not erythematous.      Left Ear: Hearing normal. Tympanic membrane is not erythematous.      Nose: Rhinorrhea present.    Eyes:      Comments: Bilateral allergic shiners   Neck:      Comments: no LAD noted to anterior/posterior cervical chains.  Pulmonary:      Breath sounds: No wheezing.   Psychiatric:      Comments: APPEARANCE: Well developed, well nourished, adequately groomed, appropriate dress for stated age and present situation    BEHAVIOR: Good eye contact, Calm and cooperative  ORIENTATION: person, place and time/date  MEMORY:  Recent and remote grossly intact.  CONCIOUSNESS: awake and responds appropriately  SPEECH: Normal rate, tone and volume.  MOTOR: normal psychomotor activity, No evidence of tremors or dystonia. No psychomotor agitation or retardation. Steady gait, normal station.  MOOD:  pleasant   AFFECT: Congruent, full range and appropriate.  THOUGHT PROCESS/ASSOCIATIONS: Clarity is coherent, Linear thoughts are intact.   THOUGHT CONTENT: appropriate to present situation; Denies any suicidal or homicidal ideations. Denies any auditory or visual hallucinations. No overt evidence of any delusions or paranoia.  INSIGHT: fair  JUDGMENT: fair  IMPULSE CONTROL: fair  ATTENTION/CONCENTRATION: Sufficient/Intact and appropriate.  FUND OF KNOWLEDGE:  Normal/average  LEVEL OF SELF ESTEEM:  average         Items reviewed in preparation for this visit:  The following portions of the patient's history were reviewed and updated as appropriate: allergies, current medications, past family history, past medical history, past surgical history and problem list.  When necessary and appropriate, prior medical records, medical history from family members or other providers were reviewed for coordination of care.     LABWORK COMPLETED PRIOR TO THIS VISIT:  Lab Walk-In on 12/24/2018   Component Date Value Ref Range Status   . Cholesterol 12/24/2018 180  <200 mg/dL Final   . Cholesterol, HDL 12/24/2018 50  >=40 mg/dL Final   . Triglyceride 12/24/2018 61  30 - 149 mg/dL Final   . LDL Calculated 12/24/2018 425* <100 mg/dL Final   . Sodium  95/63/8756 140  135 - 143 mmol/L Final   . Potassium 12/24/2018 4.2  3.5 - 5.1 mmol/L Final   . Chloride 12/24/2018 104  98 - 111 mmol/L Final   . CO2 - Carbon Dioxide 12/24/2018 28  21 - 31 mmol/L Final   . Glucose 12/24/2018 89  80 - 99 mg/dL Final   . BUN 43/32/9518 15  6 - 23 mg/dL Final   . Creatinine 84/16/6063 0.88  0.55 - 1.10 mg/dL Final   . Calcium 01/60/1093 9.0  8.6 - 10.3 mg/dL Final   . AST - Aspartate Aminotransferase 12/24/2018 12  8 - 39 IU/L Final   . ALT - Alanine Aminotransferase 12/24/2018 12  7 - 52 IU/L Final   . Alkaline Phosphatase 12/24/2018 59  34 - 104 IU/L Final   . Bilirubin Total 12/24/2018 0.4  0.3 - 1.2 mg/dL Final   . Protein Total 12/24/2018 6.8  6.0 - 8.0 g/dL Final   . Albumin 23/55/7322 4.2  3.5 - 5.0 g/dL Final   . Globulin 02/54/2706 2.6  2.2 - 3.7 g/dL Final   .  Albumin/Globulin Ratio 12/24/2018 1.6  >0.9 Final   . Anion Gap 12/24/2018 8  4 - 13 mmol/L Final   . GFR Estimated Female 12/24/2018 >60  >=60 mL/min/1.58m*2 Final   . GFR Additional Info 12/24/2018    Final   . WBC 12/24/2018 6.5  4.8 - 10.8 10*3/?L Final   . RBC 12/24/2018 4.44  4.20 - 5.40 10*6/?L Final   . Hemoglobin 12/24/2018 13.5  12.0 - 16.0 g/dL Final   . HCT 16/08/9603 39.5  37.0 - 48.0 % Final   . MCV 12/24/2018 88.9  81.0 - 99.0 fL Final   . MCH 12/24/2018 30.4  27.0 - 34.0 pg Final   . MCHC 12/24/2018 34.3  32.0 - 36.0 g/dL Final   . RDW 54/07/8118 13.9  11.5 - 14.5 % Final   . Platelet Count 12/24/2018 339  150 - 400 10*3/?L Final   . MPV 12/24/2018 9.1  7.4 - 10.4 fL Final   . Neutrophils % 12/24/2018 66  35 - 70 % Final   . Lymphocytes % 12/24/2018 22* 25 - 45 % Final   . Monocytes % 12/24/2018 6  0 - 12 % Final   . Eosinophils % 12/24/2018 5  0 - 7 % Final   . Basophils % 12/24/2018 1  0 - 2 % Final   . Neutrophils, Absolute 12/24/2018 4.3  1.6 - 7.3 10*3/?L Final   . Lymphocytes, Absolute 12/24/2018 1.4  1.1 - 4.3 10*3/?L Final   . Monocytes, Absolute 12/24/2018 0.4  0.0 - 1.2 10*3/?L Final   .  Eosinophils, Absolute 12/24/2018 0.3  0.0 - 0.7 10*3/?L Final   . Basophils, Absolute 12/24/2018 0.1  0.0 - 0.2 10*3/?L Final   . Differential Type 12/24/2018 Automated Differential   Final   . TSH - Thyroid Stimulating Hormone 12/24/2018 28.81* 0.45 - 5.33 ?IU/mL Final   . T4, Free 12/24/2018 0.7  0.6 - 1.2 ng/dL Final       RADIOLOGY STUDIES COMPLETED PRIOR TO THIS VISIT:  No results found.    Health Maintenance Due  There are no preventive care reminders to display for this patient.    Past Medical History:   Diagnosis Date   . Abnormal ultrasound 05/13/2017   . hx of Graves' disease 2002    s/p I131  in 2002   . Postablative hypothyroidism     Hypothyroidism-Dr. Leonia Reader 2002   . Vitamin D deficiency      Past Surgical History:   Procedure Laterality Date   . ABDOMINAL SURGERY     . CERVICAL BIOPSY     . PANNICULECTOMY  2005    tummy tuck   . PROCEDURE Right 06/27/2017    Procedure: LAPAROSCOPY;  ADHESIOLYSIS; ARGON BEAM TREATMENT OF EXTENSIVE ENDOMETRIOSIS;  Surgeon: Rolene Arbour, MD;  Location: [facility] OR;  Service: Gynecology;  Laterality: Right;   . PROCEDURE N/A 11/08/2017    Procedure: COLONOSCOPY with sedation;  Surgeon: Olin Pia, MD;  Location: [facility] ENDO;  Service: Endoscopy;  Laterality: N/A;   . PROCEDURE Right 09/18/2018    Procedure: LAPAROSCOPY; RIGHTSALPINGO-OOPHORECTOMY; APPENDECTOMY;  LYSIS OF PERIADENEXAL ADHESIONS;  Surgeon: Rolene Arbour, MD;  Location: [facility] OR;  Service: Gynecology;  Laterality: Right;   . TUBAL LIGATION      Tubal ligation 2008     Family History   Problem Relation Age of Onset   . Diabetes Mother    . Cancer Mother    . Heart attack Father    . Breast  cancer Neg Hx    . Ovarian cancer Neg Hx       Social History     Socioeconomic History   . Marital status: Married     Spouse name: Jerrod   . Number of children: 2   . Years of education: Not on file   . Highest education level: Not on file   Tobacco Use   . Smoking status: Never Smoker   . Smokeless tobacco: Never Used   .  Tobacco comment: passive smoke exposure   Substance and Sexual Activity   . Alcohol use: Yes     Alcohol/week: 0.0 standard drinks     Comment: occasional    . Drug use: No   . Sexual activity: Yes     Partners: Male     Birth control/protection: Surgical     Comment: Tubal Ligation   Other Topics Concern   . Caffeine Concern Yes     Comment: moderate   . Exercise Yes     Comment: occasional        I had a thoughtful and careful discussion regarding the issues today. All questions were answered. I educated and reassured. I encouraged followup as needed. Hand written instructions were given via the After Visit Summary including reference literature as appropriate. Today's medical decision maker  verbalize understanding of the treatment plan and are in agreement with the treatment plan goals. Medical decision maker is aware of how to contact clinical provider if concerns arise.      FOLLOW-UP:     Future Appointments   Date Time Provider Department Center   05/19/2019  6:30 AM Burnis Kingfisher, FNP [facility]       This note was transcribed using speech recognition software. Please contact us for clarification if any questions arise relating to the wording of this document.

## 2019-01-06 NOTE — Patient Instructions (Signed)
Referral to Endocrinology for further evaluation.     Increase dose of levothyroxine to 175 mcg Q Am.     Increase the fiber in your diet.       Cholesterol Content in Foods  Cholesterol is a waxy, fat-like substance that helps to carry fat in the blood. The body needs cholesterol in small amounts, but too much cholesterol can cause damage to the arteries and heart.  Most people should eat less than 200 milligrams (mg) of cholesterol a day.  Foods with cholesterol    Cholesterol is found in animal-based foods, such as meat, seafood, and dairy. Generally, low-fat dairy and lean meats have less cholesterol than full-fat dairy and fatty meats. The milligrams of cholesterol per serving (mg per serving) of common cholesterol-containing foods are listed below.  Meat and other proteins  ? Egg -- one large whole egg has 186 mg.  ? Veal shank -- 4 oz has 141 mg.  ? Lean ground [place] (93% lean) -- 4 oz has 118 mg.  ? Fat-trimmed lamb loin -- 4 oz has 106 mg.  ? Lean ground beef (90% lean) -- 4 oz has 100 mg.  ? Lobster -- 3.5 oz has 90 mg.  ? Pork loin chops -- 4 oz has 86 mg.  ? Canned salmon -- 3.5 oz has 83 mg.  ? Fat-trimmed beef top loin -- 4 oz has 78 mg.  ? Frankfurter -- 1 frank (3.5 oz) has 77 mg.  ? Crab -- 3.5 oz has 71 mg.  ? Roasted chicken without skin, white meat -- 4 oz has 66 mg.  ? Light bologna -- 2 oz has 45 mg.  ? Deli-cut [place] -- 2 oz has 31 mg.  ? Canned tuna -- 3.5 oz has 31 mg.  ? Tomasa Blase -- 1 oz has 29 mg.  ? Oysters and mussels (raw) -- 3.5 oz has 25 mg.  ? Mackerel -- 1 oz has 22 mg.  ? Trout -- 1 oz has 20 mg.  ? Pork sausage -- 1 link (1 oz) has 17 mg.  ? Salmon -- 1 oz has 16 mg.  ? Tilapia -- 1 oz has 14 mg.  Dairy  ? Soft-serve ice cream -- ? cup (4 oz) has 103 mg.  ? Whole-milk yogurt -- 1 cup (8 oz) has 29 mg.  ? Cheddar cheese -- 1 oz has 28 mg.  ? American cheese -- 1 oz has 28 mg.  ? Whole milk -- 1 cup (8 oz) has 23 mg.  ? 2% milk -- 1 cup (8 oz) has 18 mg.  ? Cream cheese -- 1  tablespoon (Tbsp) has 15 mg.  ? Cottage cheese -- ? cup (4 oz) has 14 mg.  ? Low-fat (1%) milk -- 1 cup (8 oz) has 10 mg.  ? Sour cream -- 1 Tbsp has 8.5 mg.  ? Low-fat yogurt -- 1 cup (8 oz) has 8 mg.  ? Nonfat Greek yogurt -- 1 cup (8 oz) has 7 mg.  ? Half-and-half cream -- 1 Tbsp has 5 mg.  Fats and oils  ? Cod liver oil -- 1 tablespoon (Tbsp) has 82 mg.  ? Butter -- 1 Tbsp has 15 mg.  ? Lard -- 1 Tbsp has 14 mg.  ? Bacon grease -- 1 Tbsp has 14 mg.  ? Mayonnaise -- 1 Tbsp has 5-10 mg.  ? Margarine -- 1 Tbsp has 3-10 mg.  Exact amounts of cholesterol in these foods may vary  depending on specific ingredients and brands.  Foods without cholesterol  Most plant-based foods do not have cholesterol unless you combine them with a food that has cholesterol. Foods without cholesterol include:  ? Grains and cereals.  ? Vegetables.  ? Fruits.  ? Vegetable oils, such as olive, canola, and sunflower oil.  ? Legumes, such as peas, beans, and lentils.  ? Nuts and seeds.  ? Egg whites.  Summary  ? The body needs cholesterol in small amounts, but too much cholesterol can cause damage to the arteries and heart.  ? Most people should eat less than 200 milligrams (mg) of cholesterol a day.  This information is not intended to replace advice given to you by your health care provider. Make sure you discuss any questions you have with your health care provider.  Document Released: 06/18/2017 Document Revised: 06/18/2017 Document Reviewed: 06/18/2017  Elsevier Interactive Patient Education ? 2019 [employer]        High Cholesterol    High cholesterol is a condition in which the blood has high levels of a white, waxy, fat-like substance (cholesterol). The human body needs small amounts of cholesterol. The liver makes all the cholesterol that the body needs. Extra (excess) cholesterol comes from the food that we eat.  Cholesterol is carried from the liver by the blood through the blood vessels. If you have high cholesterol, deposits  (plaques) may build up on the walls of your blood vessels (arteries). Plaques make the arteries narrower and stiffer. Cholesterol plaques increase your risk for heart attack and stroke. Work with your health care provider to keep your cholesterol levels in a healthy range.  What increases the risk?  This condition is more likely to develop in people who:  ? Eat foods that are high in animal fat (saturated fat) or cholesterol.  ? Are overweight.  ? Are not getting enough exercise.  ? Have a family history of high cholesterol.  What are the signs or symptoms?  There are no symptoms of this condition.  How is this diagnosed?  This condition may be diagnosed from the results of a blood test.  ? If you are older than age 66, your health care provider may check your cholesterol every 4-6 years.  ? You may be checked more often if you already have high cholesterol or other risk factors for heart disease.  The blood test for cholesterol measures:  ? Bad cholesterol (LDL cholesterol). This is the main type of cholesterol that causes heart disease. The desired level for LDL is less than 100.  ? Good cholesterol (HDL cholesterol). This type helps to protect against heart disease by cleaning the arteries and carrying the LDL away. The desired level for HDL is 60 or higher.  ? Triglycerides. These are fats that the body can store or burn for energy. The desired number for triglycerides is lower than 150.  ? Total cholesterol. This is a measure of the total amount of cholesterol in your blood, including LDL cholesterol, HDL cholesterol, and triglycerides. A healthy number is less than 200.  How is this treated?  This condition is treated with diet changes, lifestyle changes, and medicines.  Diet changes  ? This may include eating more whole grains, fruits, vegetables, nuts, and fish.  ? This may also include cutting back on red meat and foods that have a lot of added sugar.  Lifestyle changes  ? Changes may include getting at  least 40 minutes of aerobic exercise 3 times  a week. Aerobic exercises include walking, biking, and swimming. Aerobic exercise along with a healthy diet can help you maintain a healthy weight.  ? Changes may also include quitting smoking.  Medicines  ? Medicines are usually given if diet and lifestyle changes have failed to reduce your cholesterol to healthy levels.  ? Your health care provider may prescribe a statin medicine. Statin medicines have been shown to reduce cholesterol, which can reduce the risk of heart disease.  Follow these instructions at home:  Eating and drinking  If told by your health care provider:  ? Eat chicken (without skin), fish, veal, shellfish, ground [place] breast, and round or loin cuts of red meat.  ? Do not eat fried foods or fatty meats, such as hot dogs and salami.  ? Eat plenty of fruits, such as apples.  ? Eat plenty of vegetables, such as broccoli, potatoes, and carrots.  ? Eat beans, peas, and lentils.  ? Eat grains such as barley, rice, couscous, and bulgur wheat.  ? Eat pasta without cream sauces.  ? Use skim or nonfat milk, and eat low-fat or nonfat yogurt and cheeses.  ? Do not eat or drink whole milk, cream, ice cream, egg yolks, or hard cheeses.  ? Do not eat stick margarine or tub margarines that contain trans fats (also called partially hydrogenated oils).  ? Do not eat saturated tropical oils, such as coconut oil and palm oil.  ? Do not eat cakes, cookies, crackers, or other baked goods that contain trans fats.    General instructions  ? Exercise as directed by your health care provider. Increase your activity level with activities such as gardening, walking, and taking the stairs.  ? Take over-the-counter and prescription medicines only as told by your health care provider.  ? Do not use any products that contain nicotine or tobacco, such as cigarettes and e-cigarettes. If you need help quitting, ask your health care provider.  ? Keep all follow-up visits as told by  your health care provider. This is important.  Contact a health care provider if:  ? You are struggling to maintain a healthy diet or weight.  ? You need help to start on an exercise program.  ? You need help to stop smoking.  Get help right away if:  ? You have chest pain.  ? You have trouble breathing.  This information is not intended to replace advice given to you by your health care provider. Make sure you discuss any questions you have with your health care provider.  Document Released: 10/22/2005 Document Revised: 05/19/2016 Document Reviewed: 04/21/2016  Elsevier Interactive Patient Education ? 2019 [employer]      High-Fiber Diet  Fiber, also called dietary fiber, is a type of carbohydrate that is found in fruits, vegetables, whole grains, and beans. A high-fiber diet can have many health benefits. Your health care provider may recommend a high-fiber diet to help:  ? Prevent constipation. Fiber can make your bowel movements more regular.  ? Lower your cholesterol.  ? Relieve the following conditions:  ? Swelling of veins in the anus (hemorrhoids).  ? Swelling and irritation (inflammation) of specific areas of the digestive tract (uncomplicated diverticulosis).  ? A problem of the large intestine (colon) that sometimes causes pain and diarrhea (irritable bowel syndrome, IBS).  ? Prevent overeating as part of a weight-loss plan.  ? Prevent heart disease, type 2 diabetes, and certain cancers.  What is my plan?  The recommended daily fiber  intake in grams (g) includes:  ? 38 g for men age 39 or younger.  ? 30 g for men over age 40.  ? 25 g for women age 69 or younger.  ? 21 g for women over age 38.  You can get the recommended daily intake of dietary fiber by:  ? Eating a variety of fruits, vegetables, grains, and beans.  ? Taking a fiber supplement, if it is not possible to get enough fiber through your diet.  What do I need to know about a high-fiber diet?  ? It is better to get fiber through food sources  rather than from fiber supplements. There is not a lot of research about how effective supplements are.  ? Always check the fiber content on the nutrition facts label of any prepackaged food. Look for foods that contain 5 g of fiber or more per serving.  ? Talk with a diet and nutrition specialist (dietitian) if you have questions about specific foods that are recommended or not recommended for your medical condition, especially if those foods are not listed below.  ? Gradually increase how much fiber you consume. If you increase your intake of dietary fiber too quickly, you may have bloating, cramping, or gas.  ? Drink plenty of water. Water helps you to digest fiber.  What are tips for following this plan?  ? Eat a wide variety of high-fiber foods.  ? Make sure that half of the grains that you eat each day are whole grains.  ? Eat breads and cereals that are made with whole-grain flour instead of refined flour or white flour.  ? Eat brown rice, bulgur wheat, or millet instead of white rice.  ? Start the day with a breakfast that is high in fiber, such as a cereal that contains 5 g of fiber or more per serving.  ? Use beans in place of meat in soups, salads, and pasta dishes.  ? Eat high-fiber snacks, such as berries, raw vegetables, nuts, and popcorn.  ? Choose whole fruits and vegetables instead of processed forms like juice or sauce.  What foods can I eat?    Fruits  Berries. Pears. Apples. Oranges. Avocado. Prunes and raisins. Dried figs.  Vegetables  Sweet potatoes. Spinach. Kale. Artichokes. Cabbage. Broccoli. Cauliflower. Green peas. Carrots. Squash.  Grains  Whole-grain breads. Multigrain cereal. Oats and oatmeal. Brown rice. Barley. Bulgur wheat. Millet. Quinoa. Bran muffins. Popcorn. Rye wafer crackers.  Meats and other proteins  [place], kidney, and pinto beans. Soybeans. Split peas. Lentils. Nuts and seeds.  Dairy  Fiber-fortified yogurt.  Beverages  Fiber-fortified soy milk. Fiber-fortified orange  juice.  Other foods  Fiber bars.  The items listed above may not be a complete list of recommended foods and beverages. Contact a dietitian for more options.  What foods are not recommended?  Fruits  Fruit juice. Cooked, strained fruit.  Vegetables  Fried potatoes. Canned vegetables. Well-cooked vegetables.  Grains  White bread. Pasta made with refined flour. White rice.  Meats and other proteins  Fatty cuts of meat. Fried chicken or fried fish.  Dairy  Milk. Yogurt. Cream cheese. Sour cream.  Fats and oils  Butters.  Beverages  Soft drinks.  Other foods  Cakes and pastries.  The items listed above may not be a complete list of foods and beverages to avoid. Contact a dietitian for more information.  Summary  ? Fiber is a type of carbohydrate. It is found in fruits, vegetables, whole grains, and  beans.  ? There are many health benefits of eating a high-fiber diet, such as preventing constipation, lowering blood cholesterol, helping with weight loss, and reducing your risk of heart disease, diabetes, and certain cancers.  ? Gradually increase your intake of fiber. Increasing too fast can result in cramping, bloating, and gas. Drink plenty of water while you increase your fiber.  ? The best sources of fiber include whole fruits and vegetables, whole grains, nuts, seeds, and beans.  This information is not intended to replace advice given to you by your health care provider. Make sure you discuss any questions you have with your health care provider.  Document Released: 10/22/2005 Document Revised: 08/26/2017 Document Reviewed: 08/26/2017  Elsevier Interactive Patient Education ? 2019 [employer]

## 2019-01-07 MED ORDER — liothyronine (CYTOMEL) 5 MCG tablet
5 | ORAL_TABLET | Freq: Every day | ORAL | 0 refills | 90.00000 days | Status: DC
Start: 2019-01-07 — End: 2019-02-12
  Filled 2019-01-08: qty 30, 30d supply, fill #0

## 2019-01-07 NOTE — Telephone Encounter (Signed)
-----   Message from Constance Haw sent at 01/07/2019  7:57 AM PST -----  Pt is established with [facility] and has an open referral on file.     This referral is being closed as a duplicate, please reach out to pt and instruct them to call our office at [phone] to schedule an appt.     Thanks  [facility] Referrals.

## 2019-01-19 ENCOUNTER — Ambulatory Visit: Admit: 2019-01-19 | Payer: PRIVATE HEALTH INSURANCE | Attending: Family | Primary: Family

## 2019-01-19 DIAGNOSIS — F988 Other specified behavioral and emotional disorders with onset usually occurring in childhood and adolescence: Secondary | ICD-10-CM

## 2019-01-19 NOTE — Progress Notes (Signed)
Kerry Allen is a 43 y.o. female seen today for ADHD  .      Patient is alone. The Patient is the medical decision(s) maker attending today's appointment.    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 May need to add on an adderall IR 5 mg if still having problems in the afternoons with focus.     Check iron labs.     Keep F/U appt with Endo.          Support for the above plan obtained during this visit:    HPI/Interval history  Psych Concerns:    States that she feels like the adderall was helping. Has been off of the medication for the past 3 days and stages that she has had more moodiness. Pt states that she also had improvement in her concentration and in her ability to multi-task.     States that she has not had any changes in her sleep habits. States that she has not been able to take the 2nd dose during the day. States that when she does take the 2nd dose, she gets overly tired and feels like my brain is mud.     States that the agitation that she was having and irritability she was having has really improved because I am not getting as quickly and not impulsive in my response to people. States that she feels like she can relax a little more because she is able to just focused on her   More organized in her brain and only getting a little overwhelmed with what she needs to remember to do instead of all the time.     Review of Measure results below are as follows:    GAD7 01/06/2019   1. Feeling nervous, anxious or on edge Several days   2. Not being able to stop or control worrying Several days   3. Worrying too much about different things Several days   4. Trouble relaxing Several days      5. Being so restless that it is hard to sit still Not at all   6. Becoming easily annoyed or irritable Not at all   7. Feeling afraid as if something awful might happen Not at all   GAD7 Total 4       Depression Screening Questionnaire 01/06/2019 04/19/2017   During the past two weeks, have you been bothered by little interest or pleasure in doing things? Not at all Not at all   During the past two weeks, have you been bothered by feeling down, depressed, or hopeless? Not at all Not at all   Little interest or pleasure in doing things Not at all Not at all   Feeling down, depressed, or hopeless Not at all Not at all   PHQ-9 Score 0 0       MOOD DISORDER  Has there ever been a period of time when you were not your usual self and... 01/06/2019   ...you felt so good or so hyper that other people thought you were not your normal self or you were so hyper that you got into trouble? 0   ...you were so irritable that you shouted at people or started fights or arguments? 0   ...you felt much more self-confident than usual? 0   ...you got much less sleep than usual and found that you didn't really miss it? 1   ...you were more talkative or spoke much faster than usual? 0   ...thoughts raced through your head or you couldn't slow your mind down? 0   ...you were so easily distracted by things around you that you had trouble concentrating or staying on track? 0   ...you had more energy than usual? 0   ...you were much more active or did many more things than usual? 0   ...you were much more social or outgoing than usual, for example, you telephoned friends in the middle of the night? 0   ...you were much more interested in sex than usual? 0   ...you did things that were unusual for you or that other people might have thought were excessive, foolish, or risky? 0   ...spending money got you or your family in trouble? 0   Mood 1        Adult ADHD Self-Report Scale (ASRS-V1.1) Symptom Checklist 01/19/2019  03/21/2018   How often do you have trouble wrapping up the final details of a project, once the challenging parts have been done? 2 Sometimes   How often do you have difficulty getting things in order when you have to do a task that requries organization? 1 Rarely   How often do you have problems remembering appointments or obligations? 3 Sometimes   When you have a task that requires a lot of thought, how often do you avoid or delay getting started? 2 Sometimes   How often do you fidget or squirm with your hands or feet when you have to sit down for a long time? 3 Sometimes   How often do you feel overly active and compelled to do things, like you were driven by a motor? 1 Rarely   How often do you make careless mistakes when you have to work on a boring or difficult project? 2 Often   How often do you have difficulty keeping your attention when you are doing boring or repetitive work? 2 Often   How often do you have difficulty concentrating on what people say to you, even when they are speaking to you directly? 1 Sometimes   How often do you misplace or have difficulty finding things at home or at work? 3 Very Often   How often are you distracted by activity or noise around you? 2 Often   How often do you leave your seat in meetings or other situations in which you are expected to remain seated? 0 Rarely   How often do you feel restless or fidgety? 2 Sometimes   How often do you have difficulty unwinding and relaxing when you have time to yourself? 1 Sometimes   How often do you find yourself talking too much when you are in social situations? 3 Sometimes   When you're in a conversation, how often do you find yourself finishing the sentences of the people you are talking to, before they can finish them themselves? 3 Very  Often   How often do you have difficulty waiting your turn in situations when turn taking is required? 1 Sometimes   How often do you interrupt others when they are busy? 2 Often          ALLERGIES  Allergies   Allergen Reactions   . Penicillins Rash     When she was a child       Outpatient Medications Marked as Taking for the 01/19/19 encounter (Office Visit) with [name], FNP   Medication Sig Dispense Refill   . B-complex vitamin tablet Take 1 tablet by mouth daily. B-Right; Glo Herring     . cholecalciferol, vitamin D3, (VITAMIN D3) 5,000 unit Tab Take by mouth.     . IBU 600 mg tablet Take 600 mg by mouth every 6 hours as needed. for pain  2   . INOSITOL, BULK, MISC by Miscellaneous route.     Marland Kitchen levomefolate calcium (L-METHYLFOLATE ORAL) Take 5 mg by mouth.     . levothyroxine (SYNTHROID) 175 MCG tablet Take 1 tablet by mouth daily. 90 tablet 0   . liothyronine (CYTOMEL) 5 MCG tablet Take 1 tablet by mouth daily. 30 tablet 0   . [DISCONTINUED] dextroamphetamine-amphetamine (ADDERALL) 10 mg tablet Take 10 mg at 8 AM and between 12-2PM. 56 tablet 0       VITALS:  Vitals:    01/19/19 1634   BP: 116/78   BP Location: Left arm   Patient Position: Sitting   Pulse: 80   Resp: 16   Temp: 36.7 ?C (98 ?F)   TempSrc: Oral   SpO2: 98%   Weight: 168 lb (76.2 kg)   Height: 5' 2 (1.575 m)     Body mass index is 30.73 kg/m?Marland Kitchen  No LMP recorded. Patient has had an ablation.    Ideal body weight: 110 lb 7.2 oz (50.1 kg)  Adjusted ideal body weight: 133 lb 7.5 oz (60.5 kg)     Wt Readings from Last 3 Encounters:   01/19/19 168 lb (76.2 kg)   01/06/19 167 lb (75.8 kg)   11/11/18 167 lb (75.8 kg)       Review of Systems   Constitutional: Negative for chills, fatigue and fever.   HENT: Negative for congestion, ear discharge, ear pain, postnasal drip, rhinorrhea, sinus pressure, sinus pain, sneezing and sore throat.    Eyes: Negative for pain and itching.   Respiratory: Negative for cough, chest tightness, shortness of breath and wheezing.    Cardiovascular: Negative for chest pain, palpitations and leg swelling.   Gastrointestinal: Negative for abdominal pain, diarrhea, nausea and vomiting.    Psychiatric/Behavioral: Positive for decreased concentration (Improving) and sleep disturbance (Occasional). Negative for agitation, confusion, dysphoric mood, self-injury and suicidal ideas. The patient is nervous/anxious. The patient is not hyperactive.         Physical Exam   Constitutional: She appears well-developed.   Appears to feel better today.   HENT:   Right Ear: Hearing normal. Tympanic membrane is not erythematous.   Left Ear: Hearing normal. Tympanic membrane is not erythematous.   Nose: Rhinorrhea present.   Eyes:   Bilateral allergic shiners   Neck:   no LAD noted to anterior/posterior cervical chains.   Pulmonary/Chest: Effort normal and breath sounds normal. She has no wheezes.   Abdominal:   Deferred   Genitourinary:    Genitourinary Comments: Deferred     Musculoskeletal:      Comments: Normal gait and station   Skin:  Skin is warm and dry.   Psychiatric:   APPEARANCE: Well developed, well nourished, adequately groomed, appropriate dress for stated age and present situation    BEHAVIOR: Good eye contact, Calm and cooperative  ORIENTATION: person, place and time/date  MEMORY:  Recent and remote grossly intact.  CONCIOUSNESS: awake and responds appropriately  SPEECH: Normal rate, tone and volume.  MOTOR: normal psychomotor activity, No evidence of tremors or dystonia. No psychomotor agitation or retardation. Steady gait, normal station.  MOOD:  pleasant   AFFECT: Congruent, full range and appropriate.  THOUGHT PROCESS/ASSOCIATIONS: Clarity is coherent, Linear thoughts are intact.   THOUGHT CONTENT: appropriate to present situation; Denies any suicidal or homicidal ideations. Denies any auditory or visual hallucinations. No overt evidence of any delusions or paranoia.  INSIGHT: fair  JUDGMENT: fair  IMPULSE CONTROL: fair  ATTENTION/CONCENTRATION: Sufficient/Intact and appropriate.  FUND OF KNOWLEDGE:  Normal/average  LEVEL OF SELF ESTEEM:  average        Items reviewed in preparation for this  visit:  The following portions of the patient's history were reviewed and updated as appropriate: allergies, current medications, past family history, past medical history, past surgical history and problem list.  When necessary and appropriate, prior medical records, medical history from family members or other providers were reviewed for coordination of care.       LABWORK COMPLETED PRIOR TO THIS VISIT: None      RADIOLOGY STUDIES COMPLETED PRIOR TO THIS VISIT:  No results found.    Health Maintenance Due  There are no preventive care reminders to display for this patient.    Past Medical History:   Diagnosis Date   . Abnormal ultrasound 05/13/2017   . hx of Graves' disease 2002    s/p I131  in 2002   . Postablative hypothyroidism     Hypothyroidism-Dr. Leonia Reader 2002   . Vitamin D deficiency      Past Surgical History:   Procedure Laterality Date   . ABDOMINAL SURGERY     . CERVICAL BIOPSY     . PANNICULECTOMY  2005    tummy tuck   . PROCEDURE Right 06/27/2017    Procedure: LAPAROSCOPY;  ADHESIOLYSIS; ARGON BEAM TREATMENT OF EXTENSIVE ENDOMETRIOSIS;  Surgeon: Rolene Arbour, MD;  Location: [facility] OR;  Service: Gynecology;  Laterality: Right;   . PROCEDURE N/A 11/08/2017    Procedure: COLONOSCOPY with sedation;  Surgeon: Olin Pia, MD;  Location: [facility] ENDO;  Service: Endoscopy;  Laterality: N/A;   . PROCEDURE Right 09/18/2018    Procedure: LAPAROSCOPY; RIGHTSALPINGO-OOPHORECTOMY; APPENDECTOMY;  LYSIS OF PERIADENEXAL ADHESIONS;  Surgeon: Rolene Arbour, MD;  Location: [facility] OR;  Service: Gynecology;  Laterality: Right;   . TUBAL LIGATION      Tubal ligation 2008     Family History   Problem Relation Age of Onset   . Diabetes Mother    . Cancer Mother    . Heart attack Father    . Breast cancer Neg Hx    . Ovarian cancer Neg Hx       Social History     Socioeconomic History   . Marital status: Married     Spouse name: Jerrod   . Number of children: 2   . Years of education: Not on file   . Highest education level: Not on  file   Tobacco Use   . Smoking status: Never Smoker   . Smokeless tobacco: Never Used   . Tobacco comment: passive smoke exposure   Substance and Sexual  Activity   . Alcohol use: Yes     Alcohol/week: 0.0 standard drinks     Comment: occasional    . Drug use: No   . Sexual activity: Yes     Partners: Male     Birth control/protection: Surgical     Comment: Tubal Ligation   Other Topics Concern   . Caffeine Concern Yes     Comment: moderate   . Exercise Yes     Comment: occasional        I had a thoughtful and careful discussion regarding where more than 50% of 20 min visit was spent with the patient/medical decision maker addressing above concerns. The Patient's  questions were answered. I educated, reassured and encouraged followup as needed. Hand written instructions were given via the After Visit Summary including reference literature as appropriate. Today's medical decision maker did verbalize understanding of the treatment plan and were in agreement with the treatment plan goals. Medical decision maker is aware of how to contact clinical provider if concerns arise.    FOLLOW-UP:     Future Appointments   Date Time Provider Department Center   02/26/2019  8:00 AM Dierdre Highman, MD [facility]   05/19/2019  6:30 AM Burnis Kingfisher, FNP [facility]       This note was transcribed using speech recognition software. Please contact us for clarification if any questions arise relating to the wording of this document.

## 2019-01-20 MED ORDER — dextroamphetamine-amphetamine ER (ADDERALL XR) 15 MG 24 hr capsule
15 | ORAL_CAPSULE | Freq: Every morning | ORAL | 0 refills | Status: DC
Start: 2019-01-20 — End: 2019-02-25
  Filled 2019-01-20: qty 30, 30d supply, fill #0

## 2019-01-21 NOTE — Telephone Encounter (Signed)
Attempted to call patient.  No answer.  Left message and sent a my chart message for patient to call back.  Needs to scheduled a 3 month follow-up on ADHD.

## 2019-01-22 NOTE — Telephone Encounter (Signed)
Mychart message sent. Pt has an appt 04/23/19

## 2019-01-27 ENCOUNTER — Other Ambulatory Visit: Admit: 2019-01-27 | Discharge: 2019-01-27 | Payer: PRIVATE HEALTH INSURANCE | Primary: Family

## 2019-01-27 DIAGNOSIS — E559 Vitamin D deficiency, unspecified: Secondary | ICD-10-CM

## 2019-01-27 LAB — FERRITIN: Ferritin: 26 ng/mL (ref 11–200)

## 2019-01-27 LAB — UA WITH REFLEX MICRO
Ascorbic Acid, Urine: NEGATIVE
Bilirubin, Urine: NEGATIVE
Blood, Urine: NEGATIVE
Glucose, Urine: NEGATIVE mg/dL
Ketones, Urine: NEGATIVE mg/dL
Leukocyte Esterase, Urine: NEGATIVE
Nitrite, Urine: NEGATIVE
Protein, Urine: NEGATIVE mg/dL
Specific Gravity, Urine: 1.003 (ref 1.003–1.030)
Urobilinogen, Urine: NORMAL mg/dL
pH, UA: 6 (ref 5.0–9.0)

## 2019-01-27 LAB — 25-OH HYDROXY VITAMIN D, CALCIFEROL, TOTAL OF D2 & D3: Vitamin D, 25-OH Total: 60.5 ng/mL (ref 30.0–100.0)

## 2019-01-27 LAB — IRON AND TIBC
Iron Saturation: 29 % (ref 18–54)
Iron: 83 g/dL (ref 40–160)
Total Iron Binding Capacity: 289 ug/dL (ref 261–471)
Transferrin: 202 mg/dL (ref 200–362)

## 2019-01-27 LAB — BORATE HOLD

## 2019-01-30 NOTE — Telephone Encounter (Signed)
From: [name]  To: Cipriano Bunker, MD  Sent: 01/30/2019 7:14 AM PDT  Subject: Non-Urgent Medical Question    Just wondering if he wants be to get any labs done before my appointment in April

## 2019-02-02 NOTE — Telephone Encounter (Signed)
From: [name]  To: [name], FNP  Sent: 02/02/2019 11:23 AM PDT  Subject: Visit Follow-Up Question    I just wanted to do a quick follow up. I started my iron supplements and not sure if it's the low iron or the new Rx's but I am feeling really exhausted and alot of brain fog. Just not my normal self. I go see dr Osa Craver at the end of April and will be getting new labs down for the tsh but just wanted to know if this is normal?     Thank you

## 2019-02-03 DIAGNOSIS — N951 Menopausal and female climacteric states: Secondary | ICD-10-CM | POA: Diagnosis not present

## 2019-02-03 DIAGNOSIS — E039 Hypothyroidism, unspecified: Secondary | ICD-10-CM | POA: Diagnosis not present

## 2019-02-03 DIAGNOSIS — R5383 Other fatigue: Secondary | ICD-10-CM | POA: Diagnosis not present

## 2019-02-03 DIAGNOSIS — E348 Other specified endocrine disorders: Secondary | ICD-10-CM | POA: Diagnosis not present

## 2019-02-12 MED ORDER — liothyronine (CYTOMEL) 5 MCG tablet
5 | ORAL_TABLET | Freq: Every day | ORAL | 0 refills | 90.00000 days | Status: DC
Start: 2019-02-12 — End: 2019-03-23
  Filled 2019-02-17: qty 30, 30d supply, fill #0

## 2019-02-16 ENCOUNTER — Other Ambulatory Visit: Admit: 2019-02-16 | Discharge: 2019-02-16 | Payer: PRIVATE HEALTH INSURANCE | Primary: Family

## 2019-02-16 DIAGNOSIS — E890 Postprocedural hypothyroidism: Secondary | ICD-10-CM

## 2019-02-16 DIAGNOSIS — E349 Endocrine disorder, unspecified: Secondary | ICD-10-CM

## 2019-02-16 LAB — FOLLICLE STIMULATING HORMONE: FSH: 27.7 m[IU]/mL (ref <113.7)

## 2019-02-16 LAB — TSH: TSH - Thyroid Stimulating Hormone: 1.07 u[IU]/mL (ref 0.45–5.33)

## 2019-02-16 LAB — TESTOSTERONE, FREE, TOTAL: Testosterone, Free: 0.41 ng/dL (ref 0.06–0.98)

## 2019-02-16 LAB — 17-HYDROXYPROGESTERONE: 17-Hydroxyprogesterone: 158 ng/dL

## 2019-02-16 LAB — T4, FREE: T4, Free: 1 ng/dL (ref 0.6–1.2)

## 2019-02-16 LAB — ESTROGEN PROFILE E1, E2, E3
Estradiol Fraction: 145 pg/mL
Estrone: 99 pg/mL

## 2019-02-16 LAB — ESTRIOL PANEL: Estriol: <0.07 ng/mL (ref <0.08)

## 2019-02-16 LAB — TESTOSTERONE, TOTAL AND FREE, SERUM (MAYO): Testosterone, Total: 45 ng/dL (ref 8–60)

## 2019-02-16 LAB — PROGESTERONE: Progesterone: 1.4 ng/mL (ref <50.8)

## 2019-02-18 ENCOUNTER — Encounter: Payer: Self-pay | Admitting: Family Medicine

## 2019-02-18 ENCOUNTER — Ambulatory Visit (INDEPENDENT_AMBULATORY_CARE_PROVIDER_SITE_OTHER): Payer: 59 | Admitting: Family Medicine

## 2019-02-18 ENCOUNTER — Other Ambulatory Visit: Payer: Self-pay

## 2019-02-18 DIAGNOSIS — G43009 Migraine without aura, not intractable, without status migrainosus: Secondary | ICD-10-CM | POA: Diagnosis not present

## 2019-02-18 DIAGNOSIS — Z8279 Family history of other congenital malformations, deformations and chromosomal abnormalities: Secondary | ICD-10-CM | POA: Insufficient documentation

## 2019-02-18 DIAGNOSIS — R0600 Dyspnea, unspecified: Secondary | ICD-10-CM

## 2019-02-18 DIAGNOSIS — E669 Obesity, unspecified: Secondary | ICD-10-CM | POA: Insufficient documentation

## 2019-02-18 DIAGNOSIS — E282 Polycystic ovarian syndrome: Secondary | ICD-10-CM | POA: Insufficient documentation

## 2019-02-18 HISTORY — DX: Dyspnea, unspecified: R06.00

## 2019-02-18 MED ORDER — GALCANEZUMAB-GNLM 120 MG/ML ~~LOC~~ SOAJ: 120.0000 mg | SUBCUTANEOUS | 1 refills | Status: AC

## 2019-02-18 MED ORDER — GALCANEZUMAB-GNLM 120 MG/ML ~~LOC~~ SOAJ: 240.0000 mg | Freq: Once | SUBCUTANEOUS | 0 refills | Status: AC

## 2019-02-18 NOTE — Patient Instructions (Signed)
Please follow up with neurology and your PCP as scheduled.   It was a pleasure meeting you today! Thank you for choosing [place] to meet your healthcare needs!    Migraine Headache A migraine headache is an intense, throbbing pain on one side or both sides of the head. Migraines may also cause other symptoms, such as nausea, vomiting, and sensitivity to light and noise. What are the causes? Doing or taking certain things may also trigger migraines, such as:  Alcohol.  Smoking.  Medicines, such as: ? Medicine used to treat chest pain (nitroglycerine). ? Birth control pills. ? Estrogen pills. ? Certain blood pressure medicines.  Aged cheeses, chocolate, or caffeine.  Foods or drinks that contain nitrates, glutamate, aspartame, or tyramine.  Physical activity. Other things that may trigger a migraine include:  Menstruation.  Pregnancy.  Hunger.  Stress, lack of sleep, too much sleep, or fatigue.  Weather changes. What increases the risk? The following factors may make you more likely to experience migraine headaches:  Age. Risk increases with age.  Family history of migraine headaches.  Being Caucasian.  Depression and anxiety.  Obesity.  Being a woman.  Having a hole in the heart (patent foramen ovale) or other heart problems. What are the signs or symptoms? The main symptom of this condition is pulsating or throbbing pain. Pain may:  Happen in any area of the head, such as on one side or both sides.  Interfere with daily activities.  Get worse with physical activity.  Get worse with exposure to bright lights or loud noises. Other symptoms may include:  Nausea.  Vomiting.  Dizziness.  General sensitivity to bright lights, loud noises, or smells. Before you get a migraine, you may get warning signs that a migraine is developing (aura). An aura may include:  Seeing flashing lights or having blind spots.  Seeing bright spots, halos, or zigzag lines.   Having tunnel vision or blurred vision.  Having numbness or a tingling feeling.  Having trouble talking.  Having muscle weakness. How is this diagnosed? A migraine headache can be diagnosed based on:  Your symptoms.  A physical exam.  Tests, such as CT scan or MRI of the head. These imaging tests can help rule out other causes of headaches.  Taking fluid from the spine (lumbar puncture) and analyzing it (cerebrospinal fluid analysis, or CSF analysis). How is this treated? A migraine headache is usually treated with medicines that:  Relieve pain.  Relieve nausea.  Prevent migraines from coming back. Treatment may also include:  Acupuncture.  Lifestyle changes like avoiding foods that trigger migraines. Follow these instructions at home: Medicines  Take over-the-counter and prescription medicines only as told by your health care provider.  Do not drive or use heavy machinery while taking prescription pain medicine.  To prevent or treat constipation while you are taking prescription pain medicine, your health care provider may recommend that you: ? Drink enough fluid to keep your urine clear or pale yellow. ? Take over-the-counter or prescription medicines. ? Eat foods that are high in fiber, such as fresh fruits and vegetables, whole grains, and beans. ? Limit foods that are high in fat and processed sugars, such as fried and sweet foods. Lifestyle  Avoid alcohol use.  Do not use any products that contain nicotine or tobacco, such as cigarettes and e-cigarettes. If you need help quitting, ask your health care provider.  Get at least 8 hours of sleep every night.  Limit your stress. General instructions  Keep a journal to find out what may trigger your migraine headaches. For example, write down: ? What you eat and drink. ? How much sleep you get. ? Any change to your diet or medicines.  If you have a migraine: ? Avoid things that make your symptoms  worse, such as bright lights. ? It may help to lie down in a dark, quiet room. ? Do not drive or use heavy machinery. ? Ask your health care provider what activities are safe for you while you are experiencing symptoms.  Keep all follow-up visits as told by your health care provider. This is important. Contact a health care provider if:  You develop symptoms that are different or more severe than your usual migraine symptoms. Get help right away if:  Your migraine becomes severe.  You have a fever.  You have a stiff neck.  You have vision loss.  Your muscles feel weak or like you cannot control them.  You start to lose your balance often.  You develop trouble walking.  You faint. This information is not intended to replace advice given to you by your health care provider. Make sure you discuss any questions you have with your health care provider. Document Released: 10/22/2005 Document Revised: 05/11/2016 Document Reviewed: 04/09/2016 Elsevier Interactive Patient Education  2019 [employer].

## 2019-02-18 NOTE — Progress Notes (Signed)
Subjective  CC:  Chief Complaint  Patient presents with   Migraine    She reports that she is having 15 or more a month.. She reports occasional nausea with HA.    HPI: Margaret Miller is a 43 y.o. female who presents to [facility] today to for a migraine consult with me as a new patient. She has a primary care physician, has been referred to neurology, and sees integrative health.  She has the following concerns or needs:  43 year old female who is the sister of a patient of mine.  As above noted, she does have a primary care doctor but needs help with her migraine management.  She does not want to establish care here today.  She reports she has a longstanding history of classic migraines without aura starting at the age of 23.  Over the years, she has been treated with NSAIDs and Imitrex.  For the most part these medications worked well.  However she does get side effects from triptan's including grogginess and fatigue.  Her migraines are mostly associated with her menstrual cycle.  She typically got them monthly and manage them as noted above.  However over the last several months, she is having more frequent migraines.  She is averaging 15 headache days per month.  They are typically midcycle and premenstrual in nature.  She describes classic symptoms including unilateral throbbing pain associated with nausea, fatigue and photophobia.  She denies any other neurologic symptoms specifically without paresis, diplopia or changes in vision.  She is typically able to work through her migraines using medications as needed.  She has not had intractable headaches or vomiting.  She is never been to the ER for her headaches.  Her sister has similar headaches and has done well with Emgality.  She would like to be considered for this medication.  She has never had brain imaging.  Sided increased frequency of headaches they are not different in any other way at this time.  She  denies the worst headache of her life, memory changes, or other known triggers.  I reviewed her past medical history in detail.  I reviewed her old records.  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  She is status post gastric bypass surgery and requires IV iron infusions annually for malabsorptive iron deficiency.  She reports she had recent thyroid and blood counts by her PCP and they are both normal.  She has been started on progesterone treatment by integrative health to help manage her perimenopausal symptoms including insomnia.  She has not currently trying to have more children.  She has had a history of infertility.  She has a 72-year-old son.  Assessment  1. Migraine without aura and without status migrainosus, not intractable      Plan   Migraine with heavy headache burden, perhaps related to perimenopausal hormonal changes.  Counseling and education done.  Normal neuro exam today.  Trial of mentality.  Education about expectations and proper use of medication discussed in detail.  Patient will follow-up with her primary care doctor and neurologist.  Follow up:  PCP and neurology. No orders of the defined types were placed in this encounter.  Meds ordered this encounter  Medications   Galcanezumab-gnlm (EMGALITY) 120 MG/ML SOAJ    Sig: Inject 240 mg into the skin once for 1 dose.    Dispense:  2 pen    Refill:  0   Galcanezumab-gnlm (EMGALITY)  120 MG/ML SOAJ    Sig: Inject 120 mg into the skin every 30 (thirty) days.    Dispense:  1 pen    Refill:  1     Depression screen PHQ 2/9 02/18/2019  Decreased Interest 0  Down, Depressed, Hopeless 0  PHQ - 2 Score 0    We updated and reviewed the patient's past history in detail and it is documented below.  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 Back in the summer months  2 thousand 18, she had an upper respiratory infection.  After that, she initially had significant loss of smell.  Since then she has regained much of it but persists with an unusual smell sensation with certain things.  sHe wants to make sure nothing bad is going on.  She also complains of some fullness in the right ear. EXAMINATION shows cerumen obstructing about 70% of the right ear canal.  After removal, external canal and tympanic membrane are normal.  Intranasal exam shows a good look at the middle meatus and middle turbinate.  No polyps or purulence or tumors in the roof of the nasal cavity. 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  It sounds like she has had some resolution of inflammation.  It is unlikely steroids would add much now.  I have written her a Dosepak for if she wants to try it.  Reassured I do not see anything bad going on.  Follow-up as needed.    Polycystic ovaries 02/18/2019   Dyspnea 02/18/2019    Normal cardiopulmonary exercise stress test with gas exchange analysis. 2020    Health Maintenance  Topic Date Due   PAP SMEAR-Modifier  08/04/1995   INFLUENZA VACCINE  06/06/2019   TETANUS/TDAP  11/06/2023   HIV Screening  Completed   Immunization History  Administered Date(s) Administered   Tdap 10/07/2014   Current Meds  Medication Sig   acetaminophen (TYLENOL) 500 MG tablet Take 1,000 mg by mouth  every 6 (six) hours as needed for mild pain.   Ascorbic Acid (VITAMIN C PO) Take 1 tablet by mouth daily.   CALCIUM PO Take 1 tablet by mouth.   Cholecalciferol (VITAMIN D3) 2000 UNITS TABS Take 1 tablet by mouth daily.   magnesium oxide (MAG-OX) 400 MG tablet Take 400 mg by mouth daily.   Menaquinone-7 (VITAMIN K2 PO) Take 150 mcg by mouth daily.   metFORMIN (GLUCOPHAGE) 850 MG tablet metformin 850 mg tablet   OVER THE COUNTER MEDICATION Methylation Complete   Pregnenolone Micronized POWD 75 mg by Does not apply route daily.   progesterone (PROMETRIUM) 100 MG capsule    Riboflavin (VITAMIN B-2 PO) Take 400 mg by mouth daily.   SUMAtriptan (IMITREX) 50 MG tablet  zinc gluconate 50 MG tablet Take 50 mg by mouth once a week.    Allergies: Patient has No Known Allergies. Past Medical History Patient  has a past medical history of Anemia, Dyspnea (02/18/2019), cholecystectomy, gastric bypass, Hypothyroid, Magnesium deficiency, Migraines, Reflux, Seasonal allergies, and Vitamin D deficiency. Past Surgical History Patient  has a past surgical history that includes Gastric bypass; Tonsillectomy; cholestectomy; and Fracture surgery. Family History: Patient family history includes Asthma in her paternal grandfather; Cancer in her maternal grandfather and paternal grandmother; Hearing loss in her brother, father, and mother; Hypertension in her father; Miscarriages / [place] in her brother; Stroke in her paternal grandfather. Social History:  Patient  reports that she has never smoked. She has never used smokeless tobacco. She reports that she does not drink alcohol or use drugs.  Review of Systems: Constitutional: negative for fever or malaise Ophthalmic: negative for photophobia, double vision or loss of vision Cardiovascular: negative for chest pain, dyspnea on exertion, or new LE swelling Respiratory: negative for SOB or persistent cough Gastrointestinal: negative for  abdominal pain, change in bowel habits or melena Genitourinary: negative for dysuria or gross hematuria Musculoskeletal: negative for new gait disturbance or muscular weakness Integumentary: negative for new or persistent rashes Neurological: negative for TIA or stroke symptoms Psychiatric: negative for SI or delusions Allergic/Immunologic: negative for hives  Patient Care Team    Relationship Specialty Notifications Start End  Maurice Small, MD PCP - General Family Medicine  06/25/16   Keene Breath., MD  Ophthalmology  02/18/19     Objective  Vitals: BP 118/72    Pulse 75    Temp 98.4 F (36.9 C) (Oral)    Resp 16    Ht 5' 2.5" (1.588 m)    Wt 162 lb 9.6 oz (73.8 kg)    LMP 02/07/2019    SpO2 97%    BMI 29.27 kg/m  General:  Well developed, well nourished, no acute distress  Psych:  Alert and oriented,normal mood and affect HEENT:  Normocephalic, atraumatic, non-icteric sclera, PERRL, oropharynx is without mass or exudate, supple neck without adenopathy, mass or thyromegaly Cardiovascular:  RRR without gallop, rub or murmur, nondisplaced PMI Respiratory:  Good breath sounds bilaterally, CTAB with normal respiratory effort Gastrointestinal: normal bowel sounds, soft, non-tender, no noted masses. No HSM MSK: no deformities, contusions. Joints are without erythema or swelling Skin:  Warm, no rashes or suspicious lesions noted Neurologic:    Mental status is normal. Gross motor and sensory exams are normal. Normal gait   Commons side effects, risks, benefits, and alternatives for medications and treatment plan prescribed today were discussed, and the patient expressed understanding of the given instructions. Patient is instructed to call or message via MyChart if he/she has any questions or concerns regarding our treatment plan. No barriers to understanding were identified. We discussed Red Flag symptoms and signs in detail. Patient expressed understanding regarding what to do in case of  urgent or emergency type symptoms.   Medication list was reconciled, printed and provided to the patient in AVS. Patient instructions and summary information was reviewed with the patient as documented in the AVS. This note was prepared with assistance of Dragon voice recognition software. Occasional wrong-word or sound-a-like substitutions may have occurred due to the inherent limitations of voice recognition software

## 2019-02-23 ENCOUNTER — Telehealth: Admit: 2019-02-23 | Discharge: 2019-03-17 | Payer: PRIVATE HEALTH INSURANCE | Attending: Family | Primary: Family

## 2019-02-23 DIAGNOSIS — N951 Menopausal and female climacteric states: Secondary | ICD-10-CM

## 2019-02-23 NOTE — Patient Instructions (Signed)
Start on sprintec 1 tab daily x 3 weeks, then start the next pill pack. Start on Sunday. (or when you pick it up, skip the first couple of days to keep it lined up Sunday to Sundays).

## 2019-02-23 NOTE — Telephone Encounter (Addendum)
-----   Message from [name], FNP sent at 02/20/2019  5:28 PM PDT -----  Abnormal lab. Please call patient and schedule office visit to discuss results.  Hormone imbalance.

## 2019-02-23 NOTE — Progress Notes (Addendum)
Video Visit Progress Note     Kerry Allen a 43 y.o. female is being seen today via live audio/video using Google Duo for Lab Results  .     The patient's current location is [address] [facility] [place] [address].  The provider's location during this telemedicine visit is My home office at the following zip: [phone].    Patient's Name and Date of Birth confirmed: done    This telemedicine visit is being conducted due to current COVID-19 restrictions.  Informed patient that although telemedicine have great potential benefits, not everything can be addressed during a telemedicine visit. As with any medical procedure there are potential risks, such as insufficient information for good medical decision making and risks to private information. Risk to private information is higher when using third-party apps like Facetime/GoogleDuo/Skype. Facetime/GoogleDuo/Skype visits will only allowed during the COVID-19 national emergency. Telemedicine is also limited to certain non-emergent concerns.     Instructed patient/guardian: If connection is lost and video does not automatically reconnect, patient's contact number is: Cell Number [phone]    Provider read the above statement and patient consented to continue with the visit: Yes    History for today's visit obtained from patient. The Patient is the medical decision(s) maker attending today's appointment.    ASSESSMENT AND PLAN:  Problem List Items Addressed This Visit        Active Problems    Perimenopause - Primary      Other Visit Diagnoses     Screening for breast cancer        Relevant Orders    Mammography screening bilateral digital          Plan:     Patient Instructions   Start on sprintec 1 tab daily x 3 weeks, then start the next pill pack. Start on Sunday. (or when you pick it up, skip the first couple of days to keep it lined up Sunday to Sundays).             Support for the above plan obtained during this visit:    HPI  Lab Results  Pt here to discuss the  following lab results as listed below. Pt educated regarding the findings and treatment plan developed.     Discussion related to hormone imbalance symptoms/perimenopause today.  Pt is noted to be progesterone dominant on her recent lab work.    Patient has been experiencing brain fog, fatigue, difficulty sleeping hair and skin problems, headaches, unexplained weight gain and emotional disturbances.      She has not had a mammogram since 2018.  She is also due for pelvic exam.  Patient only has one ovary and has had a uterine ablation.    ALLERGIES:  Allergies   Allergen Reactions   . Penicillins Rash     When she was a child       Outpatient Medications Marked as Taking for the 02/23/19 encounter (Telemedicine) with [name], FNP   Medication Sig Dispense Refill   . B-complex vitamin tablet Take 1 tablet by mouth daily. B-Right; Glo Herring     . cholecalciferol, vitamin D3, (VITAMIN D3) 5,000 unit Tab Take by mouth.     . dextroamphetamine-amphetamine ER (ADDERALL XR) 15 MG 24 hr capsule Take 1 capsule by mouth every morning. 30 capsule 0   . IBU 600 mg tablet Take 600 mg by mouth every 6 hours as needed. for pain  2   . INOSITOL, BULK, MISC by Miscellaneous  route.     . levothyroxine (SYNTHROID) 175 MCG tablet Take 1 tablet by mouth daily. 90 tablet 0   . liothyronine (CYTOMEL) 5 MCG tablet Take 1 tablet by mouth daily. 30 tablet 0       VITALS:  There were no vitals filed for this visit.  There is no height or weight on file to calculate BMI.  No LMP recorded. Patient has had an ablation.    Ideal body weight: 110 lb 7.2 oz (50.1 kg)  Adjusted ideal body weight: 133 lb 7.5 oz (60.5 kg)     Wt Readings from Last 3 Encounters:   01/19/19 168 lb (76.2 kg)   01/06/19 167 lb (75.8 kg)   11/11/18 167 lb (75.8 kg)       Review of Systems   Constitutional: Negative for fever and malaise/fatigue.   HENT: Negative for congestion, ear discharge, nosebleeds, sinus pain and sore throat.    Eyes: Negative for blurred  vision, pain, discharge and redness.   Respiratory: Negative for cough, sputum production, chest tightness, shortness of breath and wheezing.    Cardiovascular: Negative for chest pain, palpitations and leg swelling.   Gastrointestinal: Negative for abdominal pain, constipation, diarrhea, heartburn, nausea and vomiting.   Genitourinary: Negative for dysuria, flank pain, frequency, menstrual problem, urgency and vaginal bleeding.        See HPI   Musculoskeletal: Negative for back pain, myalgias and neck pain.   Skin: Negative for itching and rash.   Neurological: Negative for dizziness, sensory change, loss of consciousness, weakness and headaches.   Endo/Heme/Allergies: Negative for polydipsia. Does not bruise/bleed easily.   Psychiatric/Behavioral: Negative for depression and suicidal ideas. The patient is nervous/anxious and has insomnia.      Also see HPI for ROS    Physical Exam   Psychiatric:   APPEARANCE: neatly groomed and appropriate dress for age and situation   CONCIOUSNESS: awake, Responds Appropriately, and alert ,   ORIENTATION: person, place, and time/date  GAIT/STATION: normal  MUSCLE STRENGTH/TONE: normal  EYE CONTACT: Good eye contact  PSYCHOMOTOR ACTIVITY: within normal limits  INVOLUNTARY MOVEMENTS: not present  SPEECH: Ordinary, Clear, Offers information, Normal  MOOD: Pleasant  AFFECT: mood-congruent  BEHAVIOR:  cooperative,  friendly, relaxed, and good rapport  ENERGY LEVEL: good  THOUGHT PROCESS:  normal  THOUGHT  Relevant to topic being discussed  THOUGHT Content: appropriate  INSIGHT/JUDGMENT: age appropriate  MEMORY:  intact  ATTENTION/CONCENTRATION: Sufficient and Adequate concentration  FUND OF KNOWLEDGE:   grossly intact, is aware of current events, is appropriate for age  LEVEL OF SELF ESTEEM:  normal      *Any tenderness or palpable anomaly was elicited/reported by patient or caregiver through provider instructed exam.      Items reviewed in preparation for this visit:  The following  portions of the patient's history were reviewed and updated as appropriate: allergies, current medications, past family history, past medical history, past surgical history and problem list.  When necessary and appropriate, prior medical records, medical history from family members or other providers were reviewed for coordination of care.       LABWORK COMPLETED PRIOR TO THIS VISIT:  Lab Walk-In on 02/16/2019   Component Date Value Ref Range Status   . Testosterone, Free 02/16/2019 0.41  0.06 - 0.98 ng/dL Final   . Testosterone, Total 02/16/2019 45  8 - 60 ng/dL Final   . 75-IEPPIRJJOACZYSAYTKZ 02/16/2019 158  ng/dL Final   . FSH 60/08/9322 27.7  <113.7 mIU/mL  Final   . Progesterone 02/16/2019 1.4  <50.8 ng/mL Final   . Estrone 02/16/2019 99  pg/mL Final   . Estradiol Fraction 02/16/2019 145  pg/mL Final   . Estriol 02/16/2019 <0.07  <0.08 ng/mL Final   Lab Walk-In on 02/16/2019   Component Date Value Ref Range Status   . TSH - Thyroid Stimulating Hormone 02/16/2019 1.07  0.45 - 5.33 ?IU/mL Final   . T4, Free 02/16/2019 1.0  0.6 - 1.2 ng/dL Final   Lab Walk-In on 01/27/2019   Component Date Value Ref Range Status   . Vitamin D 25-OH Total 01/27/2019 60.5  30.0 - 100.0 ng/mL Final   . Ferritin 01/27/2019 26  11 - 200 ng/mL Final   . Iron 01/27/2019 83  40 - 160 ?g/dL Final   . Iron Saturation 01/27/2019 29  18 - 54 % Final   . Transferrin 01/27/2019 202  200 - 362 mg/dL Final   . Total Iron Binding Capacity 01/27/2019 289  261 - 471 ?g/dL Final   . Color, Urine 01/27/2019 Yellow  Yellow Final   . Clarity, Urine 01/27/2019 Hazy* Clear Final   . Glucose, Urine 01/27/2019 Negative  Negative mg/dL Final   . Bilirubin, Urine 01/27/2019 Negative  Negative Final   . Ketones, Urine 01/27/2019 Negative  Negative mg/dL Final   . Specific Gravity, Urine 01/27/2019 1.003  1.003 - 1.030 Final   . Blood, Urine 01/27/2019 Negative  Negative Final   . pH, UA 01/27/2019 6.0  5.0 - 9.0 Final   . Protein, Urine 01/27/2019 Negative   Negative mg/dL Final   . Urobilinogen, Urine 01/27/2019 Normal  Normal mg/dL Final   . Nitrite, Urine 01/27/2019 Negative  Negative Final   . Leukocyte Esterase, Urine 01/27/2019 Negative  Negative Final   . Ascorbic Acid, Urine 01/27/2019 Negative  Negative Final   . Extra Tube 01/27/2019 Hold for add-ons.   Final   Lab Walk-In on 12/24/2018   Component Date Value Ref Range Status   . Cholesterol 12/24/2018 180  <200 mg/dL Final   . Cholesterol, HDL 12/24/2018 50  >=40 mg/dL Final   . Triglyceride 12/24/2018 61  30 - 149 mg/dL Final   . LDL Calculated 12/24/2018 295* <100 mg/dL Final   . Sodium 62/13/0865 140  135 - 143 mmol/L Final   . Potassium 12/24/2018 4.2  3.5 - 5.1 mmol/L Final   . Chloride 12/24/2018 104  98 - 111 mmol/L Final   . CO2 - Carbon Dioxide 12/24/2018 28  21 - 31 mmol/L Final   . Glucose 12/24/2018 89  80 - 99 mg/dL Final   . BUN 78/46/9629 15  6 - 23 mg/dL Final   . Creatinine 52/84/1324 0.88  0.55 - 1.10 mg/dL Final   . Calcium 40/08/2724 9.0  8.6 - 10.3 mg/dL Final   . AST - Aspartate Aminotransferase 12/24/2018 12  8 - 39 IU/L Final   . ALT - Alanine Aminotransferase 12/24/2018 12  7 - 52 IU/L Final   . Alkaline Phosphatase 12/24/2018 59  34 - 104 IU/L Final   . Bilirubin Total 12/24/2018 0.4  0.3 - 1.2 mg/dL Final   . Protein Total 12/24/2018 6.8  6.0 - 8.0 g/dL Final   . Albumin 36/64/4034 4.2  3.5 - 5.0 g/dL Final   . Globulin 74/25/9563 2.6  2.2 - 3.7 g/dL Final   . Albumin/Globulin Ratio 12/24/2018 1.6  >0.9 Final   . Anion Gap 12/24/2018 8  4 - 13 mmol/L  Final   . GFR Estimated Female 12/24/2018 >60  >=60 mL/min/1.68m*2 Final   . GFR Additional Info 12/24/2018    Final   . WBC 12/24/2018 6.5  4.8 - 10.8 10*3/?L Final   . RBC 12/24/2018 4.44  4.20 - 5.40 10*6/?L Final   . Hemoglobin 12/24/2018 13.5  12.0 - 16.0 g/dL Final   . HCT 16/08/9603 39.5  37.0 - 48.0 % Final   . MCV 12/24/2018 88.9  81.0 - 99.0 fL Final   . MCH 12/24/2018 30.4  27.0 - 34.0 pg Final   . MCHC 12/24/2018 34.3  32.0  - 36.0 g/dL Final   . RDW 54/07/8118 13.9  11.5 - 14.5 % Final   . Platelet Count 12/24/2018 339  150 - 400 10*3/?L Final   . MPV 12/24/2018 9.1  7.4 - 10.4 fL Final   . Neutrophils % 12/24/2018 66  35 - 70 % Final   . Lymphocytes % 12/24/2018 22* 25 - 45 % Final   . Monocytes % 12/24/2018 6  0 - 12 % Final   . Eosinophils % 12/24/2018 5  0 - 7 % Final   . Basophils % 12/24/2018 1  0 - 2 % Final   . Neutrophils, Absolute 12/24/2018 4.3  1.6 - 7.3 10*3/?L Final   . Lymphocytes, Absolute 12/24/2018 1.4  1.1 - 4.3 10*3/?L Final   . Monocytes, Absolute 12/24/2018 0.4  0.0 - 1.2 10*3/?L Final   . Eosinophils, Absolute 12/24/2018 0.3  0.0 - 0.7 10*3/?L Final   . Basophils, Absolute 12/24/2018 0.1  0.0 - 0.2 10*3/?L Final   . Differential Type 12/24/2018 Automated Differential   Final   . TSH - Thyroid Stimulating Hormone 12/24/2018 28.81* 0.45 - 5.33 ?IU/mL Final   . T4, Free 12/24/2018 0.7  0.6 - 1.2 ng/dL Final       RADIOLOGY STUDIES COMPLETED PRIOR TO THIS VISIT:  No results found.    Health Maintenance Due  There are no preventive care reminders to display for this patient.    Past Medical History:   Diagnosis Date   . Abnormal ultrasound 05/13/2017   . hx of Graves' disease 2002    s/p I131  in 2002   . Postablative hypothyroidism     Hypothyroidism-Dr. Leonia Reader 2002   . Vitamin D deficiency      Past Surgical History:   Procedure Laterality Date   . ABDOMINAL SURGERY     . CERVICAL BIOPSY     . PANNICULECTOMY  2005    tummy tuck   . PROCEDURE Right 06/27/2017    Procedure: LAPAROSCOPY;  ADHESIOLYSIS; ARGON BEAM TREATMENT OF EXTENSIVE ENDOMETRIOSIS;  Surgeon: Rolene Arbour, MD;  Location: [facility] OR;  Service: Gynecology;  Laterality: Right;   . PROCEDURE N/A 11/08/2017    Procedure: COLONOSCOPY with sedation;  Surgeon: Olin Pia, MD;  Location: [facility] ENDO;  Service: Endoscopy;  Laterality: N/A;   . PROCEDURE Right 09/18/2018    Procedure: LAPAROSCOPY; RIGHTSALPINGO-OOPHORECTOMY; APPENDECTOMY;  LYSIS OF PERIADENEXAL  ADHESIONS;  Surgeon: Rolene Arbour, MD;  Location: [facility] OR;  Service: Gynecology;  Laterality: Right;   . TUBAL LIGATION      Tubal ligation 2008     Family History   Problem Relation Age of Onset   . Diabetes Mother    . Cancer Mother    . Heart attack Father    . Breast cancer Neg Hx    . Ovarian cancer Neg Hx       Social  History     Socioeconomic History   . Marital status: Married     Spouse name: Jerrod   . Number of children: 2   . Years of education: Not on file   . Highest education level: Not on file   Tobacco Use   . Smoking status: Never Smoker   . Smokeless tobacco: Never Used   . Tobacco comment: passive smoke exposure   Substance and Sexual Activity   . Alcohol use: Yes     Alcohol/week: 0.0 standard drinks     Comment: occasional    . Drug use: No   . Sexual activity: Yes     Partners: Male     Birth control/protection: Surgical     Comment: Tubal Ligation   Other Topics Concern   . Caffeine Concern Yes     Comment: moderate   . Exercise Yes     Comment: occasional        I had a thoughtful and careful discussion regarding the issues today. All questions were answered. I educated and reassured. I encouraged followup as needed. Hand written instructions were given via the After Visit Summary including reference literature as appropriate. Today's medical decision maker  verbalize understanding of the treatment plan and are in agreement with the treatment plan goals. Medical decision maker is aware of how to contact clinical provider if concerns arise.    I have recommended the patient seek medical attention right away for new or worsening symptoms.    For questions regarding your plan of care, please contact  [phone].    FOLLOW-UP:  No follow-ups on file.   Future Appointments   Date Time Provider Department Center   02/26/2019  8:00 AM Dierdre Highman, MD [facility]   04/23/2019  8:30 AM [name], FNP [id number] APP Clinics   05/19/2019  6:30 AM Burnis Kingfisher, FNP [facility] APP  Specialt     This chart was produced with the Epic system using voice recognition software, manual transcription, or both. Despite concurrent proofreading, please note that transcription errors are common and may not reflect the true intent of reporter.  Please contact us for clarification if any questions arise relating to the wording of this document.

## 2019-02-23 NOTE — Telephone Encounter (Signed)
Scheduled patient for 4:30 pm today.

## 2019-02-23 NOTE — Telephone Encounter (Signed)
Attempted to call patient. No answer. Left message for patient to call back.

## 2019-02-24 MED ORDER — norgestimate-ethinyl estradioL (SPRINTEC, 28,) 0.25-35 mg-mcg per tablet
0.25-0.035 | ORAL_TABLET | Freq: Every day | ORAL | 3 refills | 84.00000 days | Status: DC
Start: 2019-02-24 — End: 2020-02-08
  Filled 2019-02-26: qty 84, 63d supply, fill #0

## 2019-02-25 MED ORDER — dextroamphetamine-amphetamine ER (ADDERALL XR) 15 MG 24 hr capsule
15 | ORAL_CAPSULE | Freq: Every morning | ORAL | 0 refills | Status: DC
Start: 2019-02-25 — End: 2019-04-03
  Filled 2019-02-26: qty 30, 30d supply, fill #0

## 2019-02-25 NOTE — Telephone Encounter (Signed)
Due for refill today

## 2019-02-26 ENCOUNTER — Encounter: Payer: PRIVATE HEALTH INSURANCE | Attending: "Endocrinology | Primary: Family

## 2019-02-27 DIAGNOSIS — R5383 Other fatigue: Secondary | ICD-10-CM | POA: Diagnosis not present

## 2019-02-27 DIAGNOSIS — E039 Hypothyroidism, unspecified: Secondary | ICD-10-CM | POA: Diagnosis not present

## 2019-02-27 DIAGNOSIS — E538 Deficiency of other specified B group vitamins: Secondary | ICD-10-CM | POA: Diagnosis not present

## 2019-03-02 ENCOUNTER — Telehealth: Payer: Self-pay | Admitting: Family Medicine

## 2019-03-02 NOTE — Telephone Encounter (Signed)
In process 

## 2019-03-02 NOTE — Telephone Encounter (Signed)
Copied from [place] [phone]. Topic: General - Other >> Mar 02, 2019  3:02 PM Leward Quan A wrote: Reason for CRM: Jeneen Rinks with Cover My Meds called to say that patient Galcanezumab-gnlm [facility]) 120 MG/ML SOAJ clinical questions have expired need to call and have questions reinstated. Need the person that work on Prior Authorizations to call Please call Ph# [phone] Reference # [id number]

## 2019-03-03 NOTE — Telephone Encounter (Signed)
Pt aware medication has been approved

## 2019-03-04 NOTE — Telephone Encounter (Signed)
See note  Copied from [place] [phone]. Topic: General - Other >> Mar 04, 2019 11:25 AM Jodie Echevaria wrote: Reason for CRM: Dub Mikes from [facility] called to obtain more information asking  Does the patient require a one time loading dose of 240 mg Galcanezumab-gnlm [facility]) ? call back # [phone]  ### [id number]

## 2019-03-04 NOTE — Telephone Encounter (Signed)
Called [facility] and spoke with Dwyane Luo, medication is in review [id number]

## 2019-03-17 ENCOUNTER — Inpatient Hospital Stay: Admit: 2019-03-17 | Payer: PRIVATE HEALTH INSURANCE | Attending: Family | Primary: Family

## 2019-03-17 DIAGNOSIS — Z1239 Encounter for other screening for malignant neoplasm of breast: Secondary | ICD-10-CM

## 2019-03-17 NOTE — Other (Signed)
Mammogram normal. Repeat in 1 year. Make sure HM is UTD. Please let patient know.

## 2019-03-17 NOTE — Telephone Encounter (Signed)
-----   Message from [name] Leggett, FNP sent at 03/17/2019 10:43 AM PDT -----  Mammogram normal. Repeat in 1 year. Make sure HM is UTD. Please let patient know.

## 2019-03-17 NOTE — Telephone Encounter (Signed)
Attempted to call patient.  No answer.  Left message for patient to call back.  Need to let her know that mammogram is normal.

## 2019-03-17 NOTE — Telephone Encounter (Signed)
Reached out to the patient to inquire how they are doing and to ensure they have all prescriptions they need during this Covid-19 pandemic. Advised them that we are offering telephone/video visits at this time should they need an appointment.

## 2019-03-18 NOTE — Telephone Encounter (Signed)
Letter sent by [facility] to inform patient that mammogram is normal.

## 2019-03-23 ENCOUNTER — Other Ambulatory Visit: Admit: 2019-03-23 | Discharge: 2019-03-23 | Payer: PRIVATE HEALTH INSURANCE | Primary: Family

## 2019-03-23 DIAGNOSIS — Z01812 Encounter for preprocedural laboratory examination: Secondary | ICD-10-CM

## 2019-03-23 LAB — PT & PTT
APTT: 25 Seconds (ref 23–36)
INR: 1 (ref 0.9–1.2)
Protime: 10.8 Seconds (ref 10.0–13.0)

## 2019-03-23 LAB — CBC WITH AUTO DIFFERENTIAL
Basophils %: 1 % (ref 0–2)
Basophils, Absolute: 0.1 10*3/ÂµL (ref 0.0–0.2)
Eosinophils %: 4 % (ref 0–7)
Eosinophils, Absolute: 0.3 10*3/ÂµL (ref 0.0–0.7)
HCT: 37.9 % (ref 37.0–48.0)
Hemoglobin: 12.5 g/dL (ref 12.0–16.0)
Lymphocytes %: 22 % — ABNORMAL LOW (ref 25–45)
Lymphocytes, Absolute: 1.7 10*3/ÂµL (ref 1.1–4.3)
MCH: 29.4 pg (ref 27.0–34.0)
MCHC: 32.9 g/dL (ref 32.0–36.0)
MCV: 89.4 fL (ref 81.0–99.0)
MPV: 10.1 fL (ref 7.4–10.4)
Monocytes %: 5 % (ref 0–12)
Monocytes, Absolute: 0.4 10*3/??L (ref 0.0–1.2)
Neutrophils %: 68 % (ref 35–70)
Neutrophils, Absolute: 5.2 10*3/??L (ref 1.6–7.3)
Platelet Count: 316 10*3/ÂµL (ref 150–400)
RBC: 4.24 10*6/ÂµL (ref 4.20–5.40)
RDW: 13.1 % (ref 11.5–14.5)
WBC: 7.7 10*3/ÂµL (ref 4.8–10.8)

## 2019-03-23 LAB — COMPREHENSIVE METABOLIC PANEL
ALT - Alanine Aminotransferase: 26 IU/L (ref 7–52)
AST - Aspartate Aminotransferase: 14 IU/L (ref 8–39)
Albumin/Globulin Ratio: 1.4 (ref >0.9)
Albumin: 3.8 g/dL (ref 3.5–5.0)
Alkaline Phosphatase: 62 IU/L (ref 34–104)
Anion Gap: 10 mmol/L (ref 4–13)
BUN: 15 mg/dL (ref 6–23)
Bilirubin Total: 0.4 mg/dL (ref 0.3–1.2)
CO2 - Carbon Dioxide: 25 mmol/L (ref 21–31)
Calcium: 8.9 mg/dL (ref 8.6–10.3)
Chloride: 105 mmol/L (ref 98–111)
Creatinine: 0.79 mg/dL (ref 0.55–1.10)
GFR Estimated Female: >60 mL/min/{1.73_m2} (ref >=60)
Globulin: 2.7 g/dL (ref 2.2–3.7)
Glucose: 104 mg/dL — ABNORMAL HIGH (ref 80–99)
Potassium: 4.1 mmol/L (ref 3.5–5.1)
Protein Total: 6.5 g/dL (ref 6.0–8.0)
Sodium: 140 mmol/L (ref 135–143)

## 2019-03-23 LAB — GLYCO-HEMOGLOBIN A1C
Estimated Average Glucose: 114 mg/dL
Glycohemoglobin (A1c): 5.6 % (ref 4.3–6.1)

## 2019-03-23 MED ORDER — liothyronine (CYTOMEL) 5 MCG tablet
5 | ORAL_TABLET | Freq: Every day | ORAL | 0 refills | 90.00000 days | Status: DC
Start: 2019-03-23 — End: 2019-04-27
  Filled 2019-03-25: qty 30, 30d supply, fill #0

## 2019-03-23 MED ORDER — scopolamine (TRANSDERM-SCOP) 1.5 mg patch
1.5 | MEDICATED_PATCH | TRANSDERMAL | 0 refills | 6.00000 days | Status: DC
Start: 2019-03-23 — End: 2019-07-07
  Filled 2019-03-24: qty 1, 3d supply, fill #0

## 2019-03-23 MED ORDER — celecoxib (CELEBREX) 200 MG capsule
200 | ORAL_CAPSULE | Freq: Two times a day (BID) | ORAL | 0 refills | 30.00000 days | Status: AC
Start: 2019-03-23 — End: 2019-03-27
  Filled 2019-03-24: qty 6, 3d supply, fill #0

## 2019-03-23 MED ORDER — ondansetron (ZOFRAN) 4 MG tablet
4 | ORAL_TABLET | Freq: Two times a day (BID) | ORAL | 0 refills | Status: DC | PRN
Start: 2019-03-23 — End: 2019-07-07
  Filled 2019-03-25: qty 10, 5d supply, fill #0

## 2019-03-23 MED ORDER — oxyCODONE (ROXICODONE) 5 MG immediate release tablet
5 | ORAL_TABLET | Freq: Four times a day (QID) | ORAL | 0 refills | 9.50000 days | Status: DC
Start: 2019-03-23 — End: 2019-07-07
  Filled 2019-03-24: qty 4, 1d supply, fill #0

## 2019-03-23 NOTE — Telephone Encounter (Signed)
Received request for Cytomel   Last refill  02/2019   last OV 02/2019  Last labs  Today 03/23/2019   Per protocol, will refill as before

## 2019-04-03 MED ORDER — dextroamphetamine-amphetamine ER (ADDERALL XR) 15 MG 24 hr capsule
15 | ORAL_CAPSULE | Freq: Every morning | ORAL | 0 refills | Status: DC
Start: 2019-04-03 — End: 2019-05-06
  Filled 2019-04-06: qty 30, 30d supply, fill #0

## 2019-04-05 ENCOUNTER — Other Ambulatory Visit: Admit: 2019-04-05 | Discharge: 2019-04-05 | Payer: PRIVATE HEALTH INSURANCE | Primary: Family

## 2019-04-05 DIAGNOSIS — Z01812 Encounter for preprocedural laboratory examination: Secondary | ICD-10-CM

## 2019-04-05 LAB — SARS-COV-2 (COVID-19), SCREENING/ASYMPTOMATIC UNEXPOSED: SARS-CoV-2 (COVID-19) by RT-PCR: NOT DETECTED

## 2019-04-06 ENCOUNTER — Encounter: Payer: Self-pay | Admitting: Hematology and Oncology

## 2019-04-13 NOTE — Telephone Encounter (Signed)
Sent my chart message to pt. To do labs before her appt. On 04/23/2019

## 2019-04-13 NOTE — Telephone Encounter (Signed)
Pt needs to have her hormone levels checked before her next visit. I just placed the orders.

## 2019-04-23 ENCOUNTER — Encounter: Payer: PRIVATE HEALTH INSURANCE | Attending: Family | Primary: Family

## 2019-04-28 MED ORDER — liothyronine (CYTOMEL) 5 MCG tablet
5 | ORAL_TABLET | Freq: Every day | ORAL | 0 refills | 90.00000 days | Status: DC
Start: 2019-04-28 — End: 2019-06-15
  Filled 2019-04-29: qty 30, 30d supply, fill #0

## 2019-04-28 MED ORDER — levothyroxine (SYNTHROID) 175 MCG tablet
175 | ORAL_TABLET | Freq: Every day | ORAL | 0 refills | Status: DC
Start: 2019-04-28 — End: 2019-08-26
  Filled 2019-04-28: qty 90, 90d supply, fill #0

## 2019-04-28 NOTE — Telephone Encounter (Signed)
Medication Being Requested:  Thyroid and Cytomel    Send To Pharmacy, or Office Pick Up?  Pharm     [facility]  [address]  [place] [address]  Phone: [phone] Fax: [phone]    How many Days Remaining of medication:  Last refills 01/2019,  03/2019    Additional Information:   Last OV - Has OV 05/05/2019   Last labs 03/2019, Per protocol, will refill as before     **Informed patient that it may take up to 48-72 hours to fill prescription.**    Future Appointments:  Future Appointments   Date Time Provider Department Center   05/05/2019  4:00 PM [name], FNP [id number] APP Clinics   05/22/2019  7:30 AM Burnis Kingfisher, FNP [facility] APP Specialt       Last Appointment this Department:  01/19/2019 [name], FNP

## 2019-05-01 MED ORDER — cephalexin (KEFLEX) 500 MG capsule
500 | ORAL_CAPSULE | Freq: Four times a day (QID) | ORAL | 0 refills | 7.00000 days | Status: DC
Start: 2019-05-01 — End: 2019-06-08
  Filled 2019-05-01: qty 28, 7d supply, fill #0

## 2019-05-01 MED ORDER — mupirocin (BACTROBAN) 2 % ointment
2 | Freq: Two times a day (BID) | TOPICAL | 0 refills | Status: DC
Start: 2019-05-01 — End: 2019-11-26
  Filled 2019-05-01: qty 22, 15d supply, fill #0

## 2019-05-04 ENCOUNTER — Other Ambulatory Visit: Admit: 2019-05-04 | Discharge: 2019-05-04 | Payer: PRIVATE HEALTH INSURANCE | Primary: Family

## 2019-05-04 DIAGNOSIS — E349 Endocrine disorder, unspecified: Secondary | ICD-10-CM

## 2019-05-04 LAB — PROGESTERONE: Progesterone: 1 ng/mL (ref <50.8)

## 2019-05-04 LAB — ESTROGEN PROFILE E1, E2, E3
Estradiol Fraction: <10 pg/mL
Estrone: 64 pg/mL

## 2019-05-04 LAB — ESTRIOL PANEL: Estriol: <0.07 ng/mL (ref <0.08)

## 2019-05-05 ENCOUNTER — Ambulatory Visit: Admit: 2019-05-05 | Payer: PRIVATE HEALTH INSURANCE | Attending: Family | Primary: Family

## 2019-05-05 DIAGNOSIS — F988 Other specified behavioral and emotional disorders with onset usually occurring in childhood and adolescence: Secondary | ICD-10-CM

## 2019-05-05 NOTE — Progress Notes (Deleted)
Kerry Allen is a 43 y.o. female seen today for ADHD and Lab Results (done 6/29)  .    Patient is alone. The Patient is the medical decision(s) maker attending today's appointment.    ASSESSMENT AND PLAN:  Problem List Items Addressed This Visit     None          Plan:     ***        Support for the above plan obtained during this visit:    HPI  Patient presents today for follow up on labs ADHD . Had Breast augmentation last month .      ALLERGIES:  Allergies   Allergen Reactions   . Penicillins Rash     When she was a child       No outpatient medications have been marked as taking for the 05/05/19 encounter (Office Visit) with [name], FNP.       Theodoro Kos:  Vitals:    05/05/19 1616   Height: 5' 2 (1.575 m)     Body mass index is 30.73 kg/m?Marland Kitchen  No LMP recorded. Patient has had an ablation.    Patient weight not recorded     Wt Readings from Last 3 Encounters:   01/19/19 168 lb (76.2 kg)   01/06/19 167 lb (75.8 kg)   11/11/18 167 lb (75.8 kg)       Review of Systems    Also see HPI for ROS    Physical Exam    ***    Items reviewed in preparation for this visit:  The following portions of the patient's history were reviewed and updated as appropriate: allergies, current medications, past family history, past medical history, past surgical history and problem list.  When necessary and appropriate, prior medical records, medical history from family members or other providers were reviewed for coordination of care.     Specialty consultations since last visit:[ ]     LABWORK COMPLETED PRIOR TO THIS VISIT:  Lab Walk-In on 05/04/2019   Component Date Value Ref Range Status   . Progesterone 05/04/2019 1.0  <50.8 ng/mL Final   . Estriol 05/04/2019 <0.07  <0.08 ng/mL Final   Lab Walk-In on 04/05/2019   Component Date Value Ref Range Status   . SARS-CoV-2 (COVID-19) by RT-PCR 04/05/2019 Not Detected  Not Detected Final   . COVID-19 Specimen Source 04/05/2019 Nasopharyngeal   Final   . COVID-19 Resulting Lab 04/05/2019  [facility], [address], [place], [place] [address]   Final   Lab Walk-In on 03/23/2019   Component Date Value Ref Range Status   . Glycohemoglobin (A1c) 03/23/2019 5.6  4.3 - 6.1 % Final   . Estimated Average Glucose 03/23/2019 114  mg/dL Final   . Protime 60/45/4098 10.8  10.0 - 13.0 Seconds Final   . INR 03/23/2019 1.0  0.9 - 1.2 Final   . APTT 03/23/2019 25  23 - 36 Seconds Final   . WBC 03/23/2019 7.7  4.8 - 10.8 10*3/?L Final   . RBC 03/23/2019 4.24  4.20 - 5.40 10*6/?L Final   . Hemoglobin 03/23/2019 12.5  12.0 - 16.0 g/dL Final   . HCT 11/91/4782 37.9  37.0 - 48.0 % Final   . MCV 03/23/2019 89.4  81.0 - 99.0 fL Final   . MCH 03/23/2019 29.4  27.0 - 34.0 pg Final   . MCHC 03/23/2019 32.9  32.0 - 36.0 g/dL Final   . RDW 95/62/1308 13.1  11.5 - 14.5 % Final   .  Platelet Count 03/23/2019 316  150 - 400 10*3/?L Final   . MPV 03/23/2019 10.1  7.4 - 10.4 fL Final   . Neutrophils % 03/23/2019 68  35 - 70 % Final   . Lymphocytes % 03/23/2019 22* 25 - 45 % Final   . Monocytes % 03/23/2019 5  0 - 12 % Final   . Eosinophils % 03/23/2019 4  0 - 7 % Final   . Basophils % 03/23/2019 1  0 - 2 % Final   . Neutrophils, Absolute 03/23/2019 5.2  1.6 - 7.3 10*3/?L Final   . Lymphocytes, Absolute 03/23/2019 1.7  1.1 - 4.3 10*3/?L Final   . Monocytes, Absolute 03/23/2019 0.4  0.0 - 1.2 10*3/?L Final   . Eosinophils, Absolute 03/23/2019 0.3  0.0 - 0.7 10*3/?L Final   . Basophils, Absolute 03/23/2019 0.1  0.0 - 0.2 10*3/?L Final   . Differential Type 03/23/2019 Automated Differential   Final   . Sodium 03/23/2019 140  135 - 143 mmol/L Final   . Potassium 03/23/2019 4.1  3.5 - 5.1 mmol/L Final   . Chloride 03/23/2019 105  98 - 111 mmol/L Final   . CO2 - Carbon Dioxide 03/23/2019 25  21 - 31 mmol/L Final   . Glucose 03/23/2019 104* 80 - 99 mg/dL Final   . BUN 16/08/9603 15  6 - 23 mg/dL Final   . Creatinine 54/07/8118 0.79  0.55 - 1.10 mg/dL Final   . Calcium 14/78/2956 8.9  8.6 - 10.3 mg/dL Final   . AST - Aspartate  Aminotransferase 03/23/2019 14  8 - 39 IU/L Final   . ALT - Alanine Aminotransferase 03/23/2019 26  7 - 52 IU/L Final   . Alkaline Phosphatase 03/23/2019 62  34 - 104 IU/L Final   . Bilirubin Total 03/23/2019 0.4  0.3 - 1.2 mg/dL Final   . Protein Total 03/23/2019 6.5  6.0 - 8.0 g/dL Final   . Albumin 21/30/8657 3.8  3.5 - 5.0 g/dL Final   . Globulin 84/69/6295 2.7  2.2 - 3.7 g/dL Final   . Albumin/Globulin Ratio 03/23/2019 1.4  >0.9 Final   . Anion Gap 03/23/2019 10  4 - 13 mmol/L Final   . GFR Estimated Female 03/23/2019 >60  >=60 mL/min/1.44m*2 Final   . GFR Additional Info 03/23/2019    Final   Lab Walk-In on 02/16/2019   Component Date Value Ref Range Status   . Testosterone, Free 02/16/2019 0.41  0.06 - 0.98 ng/dL Final   . Testosterone, Total 02/16/2019 45  8 - 60 ng/dL Final   . [id number] 02/16/2019 158  ng/dL Final   . FSH 34/74/2595 27.7  <113.7 mIU/mL Final   . Progesterone 02/16/2019 1.4  <50.8 ng/mL Final   . Estrone 02/16/2019 99  pg/mL Final   . Estradiol Fraction 02/16/2019 145  pg/mL Final   . Estriol 02/16/2019 <0.07  <0.08 ng/mL Final   Lab Walk-In on 02/16/2019   Component Date Value Ref Range Status   . TSH - Thyroid Stimulating Hormone 02/16/2019 1.07  0.45 - 5.33 ?IU/mL Final   . T4, Free 02/16/2019 1.0  0.6 - 1.2 ng/dL Final       RADIOLOGY STUDIES COMPLETED PRIOR TO THIS VISIT:  Mammography Tomo Screening Bilateral Digital W Cad    Result Date: 03/17/2019  MAMMO DIGITAL BREAST TOMOSYNTHESIS SCREENING BILATERAL 03/17/2019 7:47 AM PROVIDED CLINICAL INDICATIONS:  screening for breast cancer Encounter for other screening for malignant neoplasm of breast ADDITIONAL CLINICAL HISTORY:  Screening.  COMPARISON:  05/17/2017 TECHNIQUE:  Mediolateral oblique (MLO) and craniocaudal (CC) views of both breasts are obtained utilizing digital tomosynthesis mammographic technique and 2-D synthesized images. This examination is reviewed with the benefit of computer-aided detection software. BREAST  COMPOSITION:  The breast tissue is heterogeneously dense, which may obscure small masses. FINDINGS: No evidence of dominant mass lesion, suspicious microcalcifications, or other findings to suggest malignancy.     IMPRESSION: Benign findings. No evidence of malignancy. RECOMMENDATION:  Annual screening mammography. BI-RADS ASSESSMENT CATEGORY: (2) Benign. A. For early breast cancer detection, the [employer] of Radiology and the Society of Breast Imaging recommend: 1. Women age 4 and older should have a screening mammogram every year and should continue to do so for as long as they are in good health. 2.  Women at high risk (greater than 20% lifetime risk) should get an MRI and a mammogram every year. Women at moderately increased risk (15% to 20% lifetime risk) should talk with their doctors about the benefits and limitations of adding MRI screening to their yearly mammogram. Yearly MRI screening is not recommended for women whose lifetime risk of breast cancer is less than 15%. B. A certain low percentage of breast carcinomas are occult to imaging and a negative report should not necessarily preclude biopsy of a clinically suspicious lesion.       Health Maintenance Due  There are no preventive care reminders to display for this patient.    Past Medical History:   Diagnosis Date   . Abnormal ultrasound 05/13/2017   . hx of Graves' disease 2002    s/p I131  in 2002   . Postablative hypothyroidism     Hypothyroidism-Dr. Leonia Reader 2002   . Vitamin D deficiency      Past Surgical History:   Procedure Laterality Date   . ABDOMINAL SURGERY     . CERVICAL BIOPSY     . PANNICULECTOMY  2005    tummy tuck   . PROCEDURE Right 06/27/2017    Procedure: LAPAROSCOPY;  ADHESIOLYSIS; ARGON BEAM TREATMENT OF EXTENSIVE ENDOMETRIOSIS;  Surgeon: Rolene Arbour, MD;  Location: [facility] OR;  Service: Gynecology;  Laterality: Right;   . PROCEDURE N/A 11/08/2017    Procedure: COLONOSCOPY with sedation;  Surgeon: Olin Pia, MD;  Location:  [facility] ENDO;  Service: Endoscopy;  Laterality: N/A;   . PROCEDURE Right 09/18/2018    Procedure: LAPAROSCOPY; RIGHTSALPINGO-OOPHORECTOMY; APPENDECTOMY;  LYSIS OF PERIADENEXAL ADHESIONS;  Surgeon: Rolene Arbour, MD;  Location: [facility] OR;  Service: Gynecology;  Laterality: Right;   . TUBAL LIGATION      Tubal ligation 2008     Family History   Problem Relation Age of Onset   . Diabetes Mother    . Cancer Mother    . Heart attack Father    . Breast cancer Neg Hx    . Ovarian cancer Neg Hx       Social History     Socioeconomic History   . Marital status: Married     Spouse name: Jerrod   . Number of children: 2   . Years of education: Not on file   . Highest education level: Not on file   Tobacco Use   . Smoking status: Never Smoker   . Smokeless tobacco: Never Used   . Tobacco comment: passive smoke exposure   Substance and Sexual Activity   . Alcohol use: Yes     Alcohol/week: 0.0 standard drinks     Comment: occasional    . Drug  use: No   . Sexual activity: Yes     Partners: Male     Birth control/protection: Surgical     Comment: Tubal Ligation   Other Topics Concern   . Caffeine Concern Yes     Comment: moderate   . Exercise Yes     Comment: occasional        I had a thoughtful and careful discussion regarding the issues today. All questions were answered. I educated and reassured. I encouraged followup as needed. Hand written instructions were given via the After Visit Summary including reference literature as appropriate. Today's medical decision maker  verbalize understanding of the treatment plan and {ARE/ARE NOT:21863} in agreement with the treatment plan goals. Medical decision maker is aware of how to contact clinical provider if concerns arise.      FOLLOW-UP:  No follow-ups on file.   Future Appointments   Date Time Provider Department Center   05/22/2019  7:30 AM Burnis Kingfisher, FNP [facility] MF APP Specialt       This note was transcribed using speech recognition software. Please contact us for clarification  if any questions arise relating to the wording of this document.

## 2019-05-05 NOTE — Telephone Encounter (Addendum)
-----   Message from Mattie Marlin, [place] sent at 01/21/2019  8:33 AM PDT -----  Regarding: Lipids  DUE FOR FASTING LAB WORK.  (NEED TO PLACE LAB ORDER)  Lipids  Also, due for well woman exam.

## 2019-05-05 NOTE — Progress Notes (Signed)
Kerry Allen is a 43 y.o. female seen today for ADHD and Lab Results (done 6/29)  .      Patient is alone. The Patient is the medical decision(s) maker attending today's appointment.    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     Add adderall 5 mg daily in the afternoons.            Support for the above plan obtained during this visit:    HPI/Interval history  Psych Concerns:  Pt states that she is feeling much better with the hormone replacement and the add medication. States that she is sleeping better through the night and is not having hot flashes. Still having some concentration issues in the PM. But, overall the adderall has really been helping her feel more successful and has improved her anxiety.     Review of Measure results below are as follows:    GAD7 05/05/2019 01/06/2019   1. Feeling nervous, anxious or on edge Several days Several days   2. Not being able to stop or control worrying Several days Several days   3. Worrying too much about different things Several days Several days   4. Trouble relaxing Not at all Several days   5. Being so restless that it is hard to sit still Not at all Not at all   6. Becoming easily annoyed or irritable Several days Not at all   7. Feeling afraid as if something awful might happen Not at all Not at all   If you checked off any problem on this questionnaire so far, how difficult have these problems made it for you to do your work, take care of things at home, or get along with other people? Somewhat difficult -   GAD7 Total 4 4       Depression Screening Questionnaire 05/05/2019 01/06/2019 04/19/2017   During the past two weeks, have you been bothered by little interest or pleasure in doing things? Not at all Not at all Not at all   During the past two weeks,  have you been bothered by feeling down, depressed, or hopeless? Several Days Not at all Not at all   Little interest or pleasure in doing things Not at all Not at all Not at all   Feeling down, depressed, or hopeless Several days Not at all Not at all   Trouble falling or staying asleep, or sleeping too much Several days - -   Feeling tired or having little energy Not at all - -   Poor appetite or overeating Several days - -   Feeling bad about yourself - or that you are a failure or have let yourself or your family down Not at all - -   Trouble concentrating on things, such as reading the newspaper or watching television Several days - -   Moving or speaking so slowly that other people could have noticed. Or the opposite - being so fidgety or restless that you have been moving around a lot more than usual Not at all - -   Thoughts that you would be better off dead, or of hurting yourself in some way Not at  all - -   PHQ-9 Score 4 0 0   How difficult have these problems made it for you to do your work, take care of things at home, or get along with other people?  Not difficult at all - -       MOOD DISORDER                                 Has there ever been a period of time when you were not your usual self and... 05/05/2019 01/06/2019   ...you felt so good or so hyper that other people thought you were not your normal self or you were so hyper that you got into trouble? 0 0   ...you were so irritable that you shouted at people or started fights or arguments? 0 0   ...you felt much more self-confident than usual? 0 0   ...you got much less sleep than usual and found that you didn't really miss it? 0 1   ...you were more talkative or spoke much faster than usual? 0 0   ...thoughts raced through your head or you couldn't slow your mind down? 0 0   ...you were so easily distracted by things around you that you had trouble concentrating or staying on track? 1 0   ...you had more energy than usual? 0 0   ...you were much  more active or did many more things than usual? 0 0   ...you were much more social or outgoing than usual, for example, you telephoned friends in the middle of the night? 0 0   ...you were much more interested in sex than usual? 1 0   ...you did things that were unusual for you or that other people might have thought were excessive, foolish, or risky? 0 0   ...spending money got you or your family in trouble? 0 0   If you have checked YES to more than one of the above, have several of these ever happened during the same period of time? Yes -   How much of a problem did any of these cause you - like being unable to work; having family, money or legal troubles; getting into arguments or fights? No Problems -   Mood 2 1        Adult ADHD Self-Report Scale (ASRS-V1.1) Symptom Checklist 05/05/2019 01/19/2019 03/21/2018   How often do you have trouble wrapping up the final details of a project, once the challenging parts have been done? Rarely Sometimes Sometimes   How often do you have difficulty getting things in order when you have to do a task that requries organization? Rarely Rarely Rarely   How often do you have problems remembering appointments or obligations? Sometimes Often Sometimes   When you have a task that requires a lot of thought, how often do you avoid or delay getting started? Sometimes Sometimes Sometimes   How often do you fidget or squirm with your hands or feet when you have to sit down for a long time? Rarely Often Sometimes   How often do you feel overly active and compelled to do things, like you were driven by a motor? Rarely Rarely Rarely   How often do you make careless mistakes when you have to work on a boring or difficult project? Sometimes Sometimes Often   How often do you have difficulty keeping your attention when you are doing boring or repetitive  work? Sometimes Sometimes Often   How often do you have difficulty concentrating on what people say to you, even when they are speaking to you  directly? Sometimes Rarely Sometimes   How often do you misplace or have difficulty finding things at home or at work? Often Often Very Often   How often are you distracted by activity or noise around you? Sometimes Sometimes Often   How often do you leave your seat in meetings or other situations in which you are expected to remain seated? Never Never Rarely   How often do you feel restless or fidgety? Rarely Sometimes Sometimes   How often do you have difficulty unwinding and relaxing when you have time to yourself? Rarely Rarely Sometimes   How often do you find yourself talking too much when you are in social situations? Sometimes Often Sometimes   When you're in a conversation, how often do you find yourself finishing the sentences of the people you are talking to, before they can finish them themselves? Sometimes Often Very Often   How often do you have difficulty waiting your turn in situations when turn taking is required? Rarely Rarely Sometimes   How often do you interrupt others when they are busy? Sometimes Sometimes Often         ALLERGIES  Allergies   Allergen Reactions   . Penicillins Rash     When she was a child       Outpatient Medications Marked as Taking for the 05/05/19 encounter (Office Visit) with [name], FNP   Medication Sig Dispense Refill   . B-complex vitamin tablet Take 1 tablet by mouth daily. B-Right; Glo Herring     . cephalexin (KEFLEX) 500 MG capsule Take 1 capsule by mouth 4 times daily. 28 capsule 0   . cholecalciferol, vitamin D3, (VITAMIN D3) 5,000 unit Tab Take by mouth.     . IBU 600 mg tablet Take 600 mg by mouth every 6 hours as needed. for pain  2   . INOSITOL, BULK, MISC by Miscellaneous route.     Marland Kitchen levomefolate calcium (L-METHYLFOLATE ORAL) Take 5 mg by mouth.     . levothyroxine (SYNTHROID) 175 MCG tablet Take 1 tablet by mouth daily. 90 tablet 0   . liothyronine (CYTOMEL) 5 MCG tablet Take 1 tablet by mouth daily. 30 tablet 0   . mupirocin (BACTROBAN) 2 %  ointment Apply to affected area 2 times daily. 22 g 0   . norgestimate-ethinyl estradioL (SPRINTEC, 28,) 0.25-35 mg-mcg per tablet Take 1 tablet by mouth daily for 3 weeks then start the next pill pack, skip placebo tablets. 84 tablet 3   . ondansetron (ZOFRAN) 4 MG tablet Take 1 tablet by mouth every 12 hours as needed for nausea 10 tablet 0   . oxyCODONE (ROXICODONE) 5 MG immediate release tablet Take 1 tablet by mouth every 6 hours for 1 day 4 tablet 0   . scopolamine (TRANSDERM-SCOP) 1.5 mg patch Apply 1 patch behind the ear the evening before your surgery 1 patch 0   . [DISCONTINUED] dextroamphetamine-amphetamine ER (ADDERALL XR) 15 MG 24 hr capsule Take 1 capsule by mouth every morning. 30 capsule 0       VITALS:  Vitals:    05/05/19 1616   BP: 112/68   BP Location: Left arm   Patient Position: Sitting   Pulse: 71   Temp: 36.7 ?C (98 ?F)   TempSrc: Oral   SpO2: 96%   Weight: 168 lb (76.2  kg)   Height: 5' 2 (1.575 m)     Body mass index is 30.73 kg/m?Marland Kitchen  No LMP recorded. Patient has had an ablation.    Ideal body weight: 110 lb 7.2 oz (50.1 kg)  Adjusted ideal body weight: 133 lb 7.5 oz (60.5 kg)     Wt Readings from Last 3 Encounters:   05/05/19 168 lb (76.2 kg)   01/19/19 168 lb (76.2 kg)   01/06/19 167 lb (75.8 kg)       Review of Systems   Constitutional: Negative for chills, fatigue and fever.   HENT: Negative for congestion, ear discharge, ear pain, postnasal drip, rhinorrhea, sinus pressure, sinus pain, sneezing and sore throat.    Eyes: Negative for pain and itching.   Respiratory: Negative for cough, chest tightness, shortness of breath and wheezing.    Cardiovascular: Negative for chest pain, palpitations and leg swelling.   Gastrointestinal: Negative for abdominal pain, diarrhea, nausea and vomiting.   Psychiatric/Behavioral: Negative for sleep disturbance.        See HPI        Physical Exam   Psychiatric:   APPEARANCE: Well developed, well nourished, adequately groomed, appropriate dress for stated  age and present situation    BEHAVIOR: Good eye contact, Calm and cooperative  ORIENTATION: person, place and time/date  MEMORY:  Recent and remote grossly intact.  CONCIOUSNESS: awake and responds appropriately  SPEECH: Normal rate, tone and volume.  MOTOR: normal psychomotor activity, No evidence of tremors or dystonia. No psychomotor agitation or retardation. Steady gait, normal station.  MOOD:  Happy, pleasant   AFFECT: Congruent, full range and appropriate.  THOUGHT PROCESS/ASSOCIATIONS: Clarity is coherent, Linear thoughts are intact.   THOUGHT CONTENT: appropriate to present situation; Denies any suicidal or homicidal ideations. Denies any auditory or visual hallucinations. No overt evidence of any delusions or paranoia.  INSIGHT: fair  JUDGMENT: fair  IMPULSE CONTROL: fair  ATTENTION/CONCENTRATION: Sufficient/Intact and appropriate.  FUND OF KNOWLEDGE:  Normal/average  LEVEL OF SELF ESTEEM:  Normal        Items reviewed in preparation for this visit:  The following portions of the patient's history were reviewed and updated as appropriate: allergies, current medications, past family history, past medical history, past surgical history and problem list.  When necessary and appropriate, prior medical records, medical history from family members or other providers were reviewed for coordination of care.     LABWORK COMPLETED PRIOR TO THIS VISIT:  Lab Walk-In on 05/04/2019   Component Date Value Ref Range Status   . Progesterone 05/04/2019 1.0  <50.8 ng/mL Final   . Estriol 05/04/2019 <0.07  <0.08 ng/mL Final       RADIOLOGY STUDIES COMPLETED PRIOR TO THIS VISIT:  Mammography Tomo Screening Bilateral Digital W Cad    Result Date: 03/17/2019  MAMMO DIGITAL BREAST TOMOSYNTHESIS SCREENING BILATERAL 03/17/2019 7:47 AM PROVIDED CLINICAL INDICATIONS:  screening for breast cancer Encounter for other screening for malignant neoplasm of breast ADDITIONAL CLINICAL HISTORY:  Screening. COMPARISON:  05/17/2017 TECHNIQUE:   Mediolateral oblique (MLO) and craniocaudal (CC) views of both breasts are obtained utilizing digital tomosynthesis mammographic technique and 2-D synthesized images. This examination is reviewed with the benefit of computer-aided detection software. BREAST COMPOSITION:  The breast tissue is heterogeneously dense, which may obscure small masses. FINDINGS: No evidence of dominant mass lesion, suspicious microcalcifications, or other findings to suggest malignancy.     IMPRESSION: Benign findings. No evidence of malignancy. RECOMMENDATION:  Annual screening mammography. BI-RADS ASSESSMENT CATEGORY: (  2) Benign. A. For early breast cancer detection, the [employer] of Radiology and the Society of Breast Imaging recommend: 1. Women age 43 and older should have a screening mammogram every year and should continue to do so for as long as they are in good health. 2.  Women at high risk (greater than 20% lifetime risk) should get an MRI and a mammogram every year. Women at moderately increased risk (15% to 20% lifetime risk) should talk with their doctors about the benefits and limitations of adding MRI screening to their yearly mammogram. Yearly MRI screening is not recommended for women whose lifetime risk of breast cancer is less than 15%. B. A certain low percentage of breast carcinomas are occult to imaging and a negative report should not necessarily preclude biopsy of a clinically suspicious lesion.       Health Maintenance Due  There are no preventive care reminders to display for this patient.    Past Medical History:   Diagnosis Date   . Abnormal ultrasound 05/13/2017   . hx of Graves' disease 2002    s/p I131  in 2002   . Postablative hypothyroidism     Hypothyroidism-Dr. Leonia Reader 2002   . Vitamin D deficiency      Past Surgical History:   Procedure Laterality Date   . ABDOMINAL SURGERY     . APPENDECTOMY     . AUGMENTATION MAMMAPLASTY     . CERVICAL BIOPSY     . PANNICULECTOMY  2005    tummy tuck   . PROCEDURE  Right 06/27/2017    Procedure: LAPAROSCOPY;  ADHESIOLYSIS; ARGON BEAM TREATMENT OF EXTENSIVE ENDOMETRIOSIS;  Surgeon: Rolene Arbour, MD;  Location[facility];  Service: Gynecology;  Laterality: Right;   . PROCEDURE N/A 11/08/2017    Procedure: COLONOSCOPY with sedation;  Surgeon: Olin Pia, MD;  Location: [facility] ENDO;  Service: Endoscopy;  Laterality: N/A;   . PROCEDURE Right 09/18/2018    Procedure: LAPAROSCOPY; RIGHTSALPINGO-OOPHORECTOMY; APPENDECTOMY;  LYSIS OF PERIADENEXAL ADHESIONS;  Surgeon: Rolene Arbour, MD;  Location: [facility] OR;  Service: Gynecology;  Laterality: Right;   . TUBAL LIGATION      Tubal ligation 2008     Family History   Problem Relation Age of Onset   . Diabetes Mother    . Heart attack Mother         during a heart cath   . Lung cancer Mother         stage III   . Tobacco Dependence Mother    . Heart attack Father    . Tobacco Dependence Father    . No Known Health Problems Son    . Diabetes type II Maternal Grandmother    . Hypertension Maternal Grandmother    . Diabetes type II Paternal Grandmother    . Hypertension Paternal Grandmother    . Coronary artery disease Paternal Grandfather    . Heart attack Paternal Grandfather    . No Known Health Problems Son    . Breast cancer Neg Hx    . Ovarian cancer Neg Hx       Social History     Socioeconomic History   . Marital status: Married     Spouse name: Kerry Allen   . Number of children: 2   . Years of education: Not on file   . Highest education level: Not on file   Tobacco Use   . Smoking status: Never Smoker   . Smokeless tobacco: Never Used   .  Tobacco comment: passive smoke exposure   Substance and Sexual Activity   . Alcohol use: Yes     Alcohol/week: 0.0 standard drinks     Comment: occasional    . Drug use: No   . Sexual activity: Yes     Partners: Male     Birth control/protection: Surgical     Comment: Tubal Ligation   Other Topics Concern   . Caffeine Concern Yes     Comment: moderate   . Exercise Yes     Comment: occasional        I had  a thoughtful and careful discussion regarding where more than 50% of 20 min visit was spent with the patient/medical decision maker addressing above concerns. The Patient's  questions were answered. I educated, reassured and encouraged followup as needed. Hand written instructions were given via the After Visit Summary including reference literature as appropriate. Today's medical decision maker did verbalize understanding of the treatment plan and were in agreement with the treatment plan goals. Medical decision maker is aware of how to contact clinical provider if concerns arise.    FOLLOW-UP:  Return in about 3 months (around 08/05/2019) for F/U on ADD/ADHD.   Future Appointments   Date Time Provider Department Center   05/22/2019  7:30 AM Burnis Kingfisher, FNP AP [facility] MF APP Specialt       This note was transcribed using speech recognition software. Please contact us for clarification if any questions arise relating to the wording of this document.

## 2019-05-05 NOTE — Telephone Encounter (Signed)
Recall sent to inform patient it is time to have lab work done.

## 2019-05-06 MED ORDER — dextroamphetamine-amphetamine ER (ADDERALL XR) 15 MG 24 hr capsule
15 | ORAL_CAPSULE | Freq: Every morning | ORAL | 0 refills | Status: DC
Start: 2019-05-06 — End: 2019-06-08

## 2019-05-06 MED ORDER — dextroamphetamine-amphetamine (ADDERALL) 5 mg Tab
5 | ORAL_TABLET | Freq: Every day | ORAL | 0 refills | Status: DC
Start: 2019-05-06 — End: 2019-06-08
  Filled 2019-05-08: qty 28, 28d supply, fill #0

## 2019-05-08 MED FILL — DEXTROAMPHETAMINE-AMPHETAMINE ER 15 MG 24HR CAPSULE,EXTEND RELEASE: 15 mg | ORAL | 84 days supply | Qty: 84 | Fill #0

## 2019-05-12 NOTE — Telephone Encounter (Addendum)
-----   Message from [name], FNP sent at 05/11/2019  6:44 PM PDT -----  Now that I have all her hormone results, need to discuss further. Levels do not appear adequate with just the birth control.      Recent Results (from the past 672 hour(s))   Progesterone Panel -Routine    Collection Time: 05/04/19  6:50 AM   Result Value Ref Range    Progesterone 1.0 <50.8 ng/mL   Estrone, Estradiol, and Estriol -Routine    Collection Time: 05/04/19  6:50 AM   Result Value Ref Range    Estrone 64 pg/mL    Estradiol Fraction <10 pg/mL   Estriol, Unconjugated, serum -Routine    Collection Time: 05/04/19  6:50 AM   Result Value Ref Range    Estriol <0.07 <0.08 ng/mL

## 2019-05-12 NOTE — Telephone Encounter (Signed)
Attempted to call patient. No answer. Left message for patient to call back.

## 2019-05-13 NOTE — Telephone Encounter (Signed)
Patient scheduled.    Future Appointments   Date Time Provider Department Center   05/22/2019  7:30 AM Burnis Kingfisher, FNP AP [facility] MF APP Specialt   06/08/2019  4:30 PM [name], FNP [id number] APP [facility]ics

## 2019-05-22 ENCOUNTER — Encounter: Admit: 2019-05-22 | Discharge: 2019-05-22 | Attending: Family | Primary: Family

## 2019-05-22 DIAGNOSIS — Z021 Encounter for pre-employment examination: Secondary | ICD-10-CM

## 2019-05-22 NOTE — Progress Notes (Signed)
Immunizations Administered     Name Date Dose VIS Date Route    HEP-B ADULT 05/22/2019 1 mL 08/16/2017 Intramuscular    Site: Left deltoid    Given By: Burnis Kingfisher, FNP    Documented By: Burnis Kingfisher, FNP    Manufacturer: [employer]    Lot: [id number]    NDC: [id number]    Expiration Date: 10/09/2021

## 2019-05-25 MED FILL — NORGESTIMATE 0.25 MG-ETHINYL ESTRADIOL 0.035 MG TABLET: 0.25-0.035 mg | ORAL | 63 days supply | Qty: 84 | Fill #1

## 2019-06-02 ENCOUNTER — Other Ambulatory Visit: Payer: Self-pay

## 2019-06-08 ENCOUNTER — Telehealth: Admit: 2019-06-08 | Discharge: 2019-06-16 | Payer: PRIVATE HEALTH INSURANCE | Attending: Family | Primary: Family

## 2019-06-08 DIAGNOSIS — F988 Other specified behavioral and emotional disorders with onset usually occurring in childhood and adolescence: Secondary | ICD-10-CM

## 2019-06-08 NOTE — Progress Notes (Signed)
Video Visit Progress Note     Kerry Allen a 43 y.o. female is being seen today via live audio/video using Google Duo for Lab Results  .     The patient's current location is  [address] [place] [[phone].  The provider's location during this telemedicine visit is My home office at the following zip: [phone].    Patient's Name and Date of Birth confirmed: done    This telemedicine visit is being conducted due to current COVID-19 restrictions.  Informed patient that although telemedicine have great potential benefits, not everything can be addressed during a telemedicine visit. As with any medical procedure there are potential risks, such as insufficient information for good medical decision making and risks to private information. Risk to private information is higher when using third-party apps like Facetime/GoogleDuo/Skype. Facetime/GoogleDuo/Skype visits will only allowed during the COVID-19 national emergency. Telemedicine is also limited to certain non-emergent concerns.     Instructed patient/guardian: If connection is lost and video does not automatically reconnect, patient's contact number is: Cell Number [phone]    Provider read the above statement and patient consented to continue with the visit: Yes    History for today's visit obtained from patient. The Patient is the medical decision(s) maker attending today's appointment.    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     Refill of adderall sent to the pharmacy.         Support for the above plan obtained during this visit:    HPI  HRT  States that she is feeling better and not having any hot flashes. Has been sleeping through the night.  Libido is better than it was. Moods have stabilized and she is not having the roller coaster that she was on.  less snappy.    Noted that her Estradiol level and progesterone levels are not any higher than they were in April.     Adult ADD  States that the additional 5 mg at 2 PM has been helpful. Feels like the IR adderall is wearing off around 7-8 PM. States that she has been waking up at 5:30 AM and is waking up rested. The hypersomnia has stopped.       ALLERGIES:  Allergies   Allergen Reactions   . Penicillins Rash     When she was a child       Outpatient Medications Marked as Taking for the 06/08/19 encounter (Telemedicine) with [name], FNP   Medication Sig Dispense Refill   . B-complex vitamin tablet Take 1 tablet by mouth daily. B-Right; Glo Herring     . cholecalciferol, vitamin D3, (VITAMIN D3) 5,000 unit Tab Take by mouth.     . dextroamphetamine-amphetamine (ADDERALL) 5 mg Tab Take 1 tablet by mouth daily between 2:00PM and 3:00PM 28 tablet 0   . dextroamphetamine-amphetamine ER (ADDERALL XR) 15 MG 24 hr capsule Take 1 capsule by mouth every morning for 28 days. 28 capsule 0   . IBU 600 mg tablet Take 600 mg by mouth every 6 hours as needed. for pain  2   . INOSITOL, BULK, MISC by Miscellaneous route.     Marland Kitchen levomefolate calcium (L-METHYLFOLATE ORAL) Take 5 mg by  mouth.     . levothyroxine (SYNTHROID) 175 MCG tablet Take 1 tablet by mouth daily. 90 tablet 0   . liothyronine (CYTOMEL) 5 MCG tablet Take 1 tablet by mouth daily. 30 tablet 0   . mupirocin (BACTROBAN) 2 % ointment Apply to affected area 2 times daily. 22 g 0   . norgestimate-ethinyl estradioL (SPRINTEC, 28,) 0.25-35 mg-mcg per tablet Take 1 tablet by mouth daily for 3 weeks then start the next pill pack, skip placebo tablets. 84 tablet 3   . ondansetron (ZOFRAN) 4 MG tablet Take 1 tablet by mouth every 12 hours as needed for nausea 10 tablet 0   . [DISCONTINUED] cephalexin (KEFLEX) 500 MG capsule Take 1 capsule by mouth 4 times daily. 28 capsule 0   . [DISCONTINUED] dextroamphetamine-amphetamine (ADDERALL) 5 mg Tab Take 1 tablet by mouth  daily between 2:00PM and 3:00PM 28 tablet 0   . [DISCONTINUED] dextroamphetamine-amphetamine ER (ADDERALL XR) 15 MG 24 hr capsule Take 1 capsule by mouth every morning. 84 capsule 0       VITALS:  There were no vitals filed for this visit.  There is no height or weight on file to calculate BMI.  No LMP recorded. Patient has had an ablation.    Ideal body weight: 110 lb 7.2 oz (50.1 kg)  Adjusted ideal body weight: 133 lb 7.5 oz (60.5 kg)     Wt Readings from Last 3 Encounters:   05/05/19 168 lb (76.2 kg)   01/19/19 168 lb (76.2 kg)   01/06/19 167 lb (75.8 kg)       Review of Systems   Constitutional: Negative for chills, fever and malaise/fatigue.   HENT: Negative for congestion, ear discharge, ear pain, sinus pain and sore throat.    Respiratory: Negative for cough, sputum production, shortness of breath and wheezing.    Cardiovascular: Negative for chest pain, palpitations and leg swelling.   Gastrointestinal: Negative for abdominal pain, constipation, diarrhea, nausea and vomiting.   Neurological: Negative for dizziness and headaches.       Physical Exam   Psychiatric:   APPEARANCE: neatly groomed and appropriate dress for age and situation   CONCIOUSNESS: awake, Responds Appropriately, and alert ,   ORIENTATION: person, place, and time/date  GAIT/STATION: normal  MUSCLE STRENGTH/TONE: normal  EYE CONTACT: Good eye contact  PSYCHOMOTOR ACTIVITY: within normal limits  INVOLUNTARY MOVEMENTS: not present  SPEECH: Ordinary, Clear, Offers information, Normal  MOOD: Pleasant  AFFECT: mood-congruent  BEHAVIOR:  cooperative,  friendly, relaxed, and good rapport  ENERGY LEVEL: good  THOUGHT PROCESS:  normal  THOUGHT  Relevant to topic being discussed  THOUGHT Content: appropriate  INSIGHT/JUDGMENT: age appropriate  MEMORY:  intact  ATTENTION/CONCENTRATION: Sufficient and Adequate concentration  FUND OF KNOWLEDGE:   grossly intact, is aware of current events, is appropriate for age  LEVEL OF SELF ESTEEM:  normal      *Any  tenderness or palpable anomaly was elicited/reported by patient or caregiver through provider instructed exam.      Items reviewed in preparation for this visit:  The following portions of the patient's history were reviewed and updated as appropriate: allergies, current medications, past family history, past medical history, past surgical history and problem list.  When necessary and appropriate, prior medical records, medical history from family members or other providers were reviewed for coordination of care.     LABWORK COMPLETED PRIOR TO THIS VISIT:  Lab Walk-In on 05/04/2019   Component Date Value Ref Range Status   .  Progesterone 05/04/2019 1.0  <50.8 ng/mL Final   . Estrone 05/04/2019 64  pg/mL Final   . Estradiol Fraction 05/04/2019 <10  pg/mL Final   . Estriol 05/04/2019 <0.07  <0.08 ng/mL Final       RADIOLOGY STUDIES COMPLETED PRIOR TO THIS VISIT: none      Health Maintenance Due  Health Maintenance Due   Topic Date Due   . INFLUENZA VACCINE  07/07/2019       Past Medical History:   Diagnosis Date   . Abnormal ultrasound 05/13/2017   . hx of Graves' disease 2002    s/p I131  in 2002   . Postablative hypothyroidism     Hypothyroidism-Dr. Leonia Reader 2002   . Vitamin D deficiency      Past Surgical History:   Procedure Laterality Date   . ABDOMINAL SURGERY     . APPENDECTOMY     . AUGMENTATION MAMMAPLASTY     . CERVICAL BIOPSY     . PANNICULECTOMY  2005    tummy tuck   . PROCEDURE Right 06/27/2017    Procedure: LAPAROSCOPY;  ADHESIOLYSIS; ARGON BEAM TREATMENT OF EXTENSIVE ENDOMETRIOSIS;  Surgeon: Rolene Arbour, MD;  Location: [facility] OR;  Service: Gynecology;  Laterality: Right;   . PROCEDURE N/A 11/08/2017    Procedure: COLONOSCOPY with sedation;  Surgeon: Olin Pia, MD;  Location: [facility] ENDO;  Service: Endoscopy;  Laterality: N/A;   . PROCEDURE Right 09/18/2018    Procedure: LAPAROSCOPY; RIGHTSALPINGO-OOPHORECTOMY; APPENDECTOMY;  LYSIS OF PERIADENEXAL ADHESIONS;  Surgeon: Rolene Arbour, MD;  Location: [facility]  OR;  Service: Gynecology;  Laterality: Right;   . TUBAL LIGATION      Tubal ligation 2008     Family History   Problem Relation Age of Onset   . Diabetes Mother    . Heart attack Mother         during a heart cath   . Lung cancer Mother         stage III   . Tobacco Dependence Mother    . Heart attack Father    . Tobacco Dependence Father    . No Known Health Problems Son    . Diabetes type II Maternal Grandmother    . Hypertension Maternal Grandmother    . Diabetes type II Paternal Grandmother    . Hypertension Paternal Grandmother    . Coronary artery disease Paternal Grandfather    . Heart attack Paternal Grandfather    . No Known Health Problems Son    . Breast cancer Neg Hx    . Ovarian cancer Neg Hx       Social History     Socioeconomic History   . Marital status: Married     Spouse name: Jerrod   . Number of children: 2   . Years of education: Not on file   . Highest education level: Not on file   Tobacco Use   . Smoking status: Never Smoker   . Smokeless tobacco: Never Used   . Tobacco comment: passive smoke exposure   Substance and Sexual Activity   . Alcohol use: Yes     Alcohol/week: 0.0 standard drinks     Comment: occasional    . Drug use: No   . Sexual activity: Yes     Partners: Male     Birth control/protection: Surgical     Comment: Tubal Ligation   Other Topics Concern   . Caffeine Concern Yes     Comment: moderate   .  Exercise Yes     Comment: occasional        I had a thoughtful and careful discussion regarding the issues today. All questions were answered. I educated and reassured. I encouraged followup as needed. Hand written instructions were given via the After Visit Summary including reference literature as appropriate. Today's medical decision maker  verbalize understanding of the treatment plan and are in agreement with the treatment plan goals. Medical decision maker is aware of how to contact clinical provider if concerns arise.    I have recommended the patient seek medical attention  right away for new or worsening symptoms. For questions regarding your plan of care, please contact  [phone].    FOLLOW-UP:  Return in about 3 months (around 09/08/2019) for F/U on ADD/ADHD.   Future Appointments   Date Time Provider Department Center   06/22/2019  6:30 AM Burnis Kingfisher, FNP AP [facility] MF APP Specialt     This chart was produced with the Epic system using voice recognition software, manual transcription, or both. Despite concurrent proofreading, please note that transcription errors are common and may not reflect the true intent of reporter.  Please contact us for clarification if any questions arise relating to the wording of this document.

## 2019-06-09 MED ORDER — dextroamphetamine-amphetamine (ADDERALL) 5 mg Tab
5 | ORAL_TABLET | Freq: Every day | ORAL | 0 refills | Status: DC
Start: 2019-06-09 — End: 2020-01-05
  Filled 2019-06-12: qty 28, 28d supply, fill #0

## 2019-06-09 MED ORDER — dextroamphetamine-amphetamine ER (ADDERALL XR) 15 MG 24 hr capsule
15 | ORAL_CAPSULE | Freq: Every morning | ORAL | 0 refills | Status: DC
Start: 2019-06-09 — End: 2019-10-08

## 2019-06-16 MED ORDER — liothyronine (CYTOMEL) 5 MCG tablet
5 | ORAL_TABLET | Freq: Every day | ORAL | 0 refills | 90.00000 days | Status: DC
Start: 2019-06-16 — End: 2019-08-06
  Filled 2019-06-19: qty 30, 30d supply, fill #0

## 2019-06-22 ENCOUNTER — Encounter: Admit: 2019-06-23 | Discharge: 2019-06-25 | Attending: Family | Primary: Family

## 2019-06-22 NOTE — Progress Notes (Signed)
From: Cleta Alberts   Sent: Tuesday, June 23, 2019 3:37 PM  To: Puff, [email or web address]  Subject: Hep B titer due    Good afternoon Mardene Celeste,    Your Hepatitis B titer is due today. To have your labs drawn, please go to the [facility] at [address] and check in at the lab station on the right side of the lobby.  Lab orders have already been placed in EPIC so you do not need to come to Employee Health beforehand.    If you have questions, please contact Employee Health.    Thank you,    [facility] has moved. Our new location is [address].    Theodoro Doing Sriansh Farra   Clinical CoordinatorEmployee Health   Phone: [phone] Fax: [phone] Email: [email or web address]

## 2019-06-27 ENCOUNTER — Other Ambulatory Visit: Admit: 2019-06-27 | Discharge: 2019-06-27 | Primary: Family

## 2019-06-27 DIAGNOSIS — Z021 Encounter for pre-employment examination: Secondary | ICD-10-CM

## 2019-06-27 LAB — HEPATITIS B SURFACE ANTIBODY: Hepatitis B Surface Antibody Interpretation: IMMUNE

## 2019-07-07 ENCOUNTER — Ambulatory Visit: Admit: 2019-07-07 | Payer: PRIVATE HEALTH INSURANCE | Attending: Family | Primary: Family

## 2019-07-07 DIAGNOSIS — R109 Unspecified abdominal pain: Secondary | ICD-10-CM

## 2019-07-07 LAB — POCT URINE
Bilirubin (Urine): NEGATIVE mg/dL
Blood (urine): NEGATIVE mg/dL
Glucose (Urine): NEGATIVE mg/dL
Ketone (Urine): NEGATIVE mg/dL
Nitrites (Urine): POSITIVE mg/dL
Protein (Urine): NEGATIVE mg/dL
Specific Gravity (Urine): 1.02 (ref 1.000–1.030)
Urobilinogen (Urine): 0.2 mg/dL
pH (Urine): 5.5 (ref 5.0–8.5)

## 2019-07-07 LAB — URINE CULTURE: Culture Result: >100000 — AB

## 2019-07-07 MED ORDER — ciprofloxacin HCl (CIPRO) 500 MG tablet
500 | ORAL_TABLET | Freq: Two times a day (BID) | ORAL | 0 refills | 9.00000 days | Status: AC
Start: 2019-07-07 — End: 2019-07-18
  Filled 2019-07-08: qty 20, 10d supply, fill #0

## 2019-07-07 NOTE — Progress Notes (Signed)
Kerry Allen is a 43 y.o. female seen today for Urinary Tract Infection (foul odor, cloudiness and flank pain)  .    Patient is alone. The Patient is the medical decision(s) maker attending today's appointment.    ASSESSMENT AND PLAN:  Problem List Items Addressed This Visit     None      Visit Diagnoses     Flank pain    -  Primary    Relevant Orders    APP POC Urine (Completed)    Urine culture    Acute cystitis without hematuria              Plan:     Nitrite positive.  Start on Cipro 500 mg twice daily x10 days.        Support for the above plan obtained during this visit:    HPI  UTI  Started last week and has progressively gotten worse.  Burning at end of micturition.  Has tried increasing her water intake to combat symptoms.      ALLERGIES:  Allergies   Allergen Reactions   . Penicillins Rash     When she was a child       Outpatient Medications Marked as Taking for the 07/07/19 encounter (Office Visit) with [name], FNP   Medication Sig Dispense Refill   . B-complex vitamin tablet Take 1 tablet by mouth daily. B-Right; Glo Herring     . cholecalciferol, vitamin D3, (VITAMIN D3) 5,000 unit Tab Take by mouth.     . dextroamphetamine-amphetamine (ADDERALL) 5 mg Tab Take 1 tablet by mouth daily between 2:00PM and 3:00PM 28 tablet 0   . dextroamphetamine-amphetamine ER (ADDERALL XR) 15 MG 24 hr capsule Take 1 capsule by mouth every morning for 28 days. 28 capsule 0   . IBU 600 mg tablet Take 600 mg by mouth every 6 hours as needed. for pain  2   . INOSITOL, BULK, MISC by Miscellaneous route.     Marland Kitchen levomefolate calcium (L-METHYLFOLATE ORAL) Take 5 mg by mouth.     . levothyroxine (SYNTHROID) 175 MCG tablet Take 1 tablet by mouth daily. 90 tablet 0   . liothyronine (CYTOMEL) 5 MCG tablet Take 1 tablet by mouth daily. 30 tablet 0   . mupirocin (BACTROBAN) 2 % ointment Apply to affected area 2 times daily. 22 g 0   . norgestimate-ethinyl estradioL (SPRINTEC, 28,) 0.25-35 mg-mcg per tablet Take 1 tablet by  mouth daily for 3 weeks then start the next pill pack, skip placebo tablets. 84 tablet 3       VITALS:  Vitals:    07/07/19 1458   BP: 116/68   Pulse: 94   Temp: 36.8 ?C (98.2 ?F)   TempSrc: Oral   SpO2: 99%   Weight: 169 lb (76.7 kg)   Height: 5' 2 (1.575 m)     Body mass index is 30.91 kg/m?Marland Kitchen  No LMP recorded. Patient has had an ablation.    Ideal body weight: 110 lb 7.2 oz (50.1 kg)  Adjusted ideal body weight: 133 lb 13.9 oz (60.7 kg)     Wt Readings from Last 3 Encounters:   07/07/19 169 lb (76.7 kg)   05/05/19 168 lb (76.2 kg)   01/19/19 168 lb (76.2 kg)       Review of Systems   Constitutional: Negative for chills, fatigue and fever.   HENT: Negative for congestion, ear pain, sinus pressure and sore throat.    Respiratory: Negative for  chest tightness and shortness of breath.    Gastrointestinal: Negative for abdominal pain, constipation, diarrhea, nausea and vomiting.   Endocrine: Negative for polydipsia, polyphagia and polyuria.   Genitourinary: Positive for flank pain. Negative for difficulty urinating and dysuria.   Musculoskeletal: Negative for arthralgias and myalgias.   Skin: Negative for rash.   Neurological: Negative for dizziness and headaches.   Psychiatric/Behavioral: Negative for decreased concentration, dysphoric mood, sleep disturbance and suicidal ideas. The patient is not nervous/anxious.        Physical Exam  Constitutional:       General: She is not in acute distress.     Appearance: She is well-developed.   Cardiovascular:      Rate and Rhythm: Normal rate and regular rhythm.   Pulmonary:      Effort: Pulmonary effort is normal.      Breath sounds: Normal breath sounds.   Abdominal:      Comments: No flank pain bilaterally with palpation.   Musculoskeletal: Normal range of motion.   Skin:     General: Skin is warm and dry.      Capillary Refill: Capillary refill takes less than 2 seconds.   Neurological:      General: No focal deficit present.      Mental Status: She is alert and oriented  to person, place, and time.       Items reviewed in preparation for this visit:  The following portions of the patient's history were reviewed and updated as appropriate: allergies, current medications, past family history, past medical history, past surgical history and problem list.  When necessary and appropriate, prior medical records, medical history from family members or other providers were reviewed for coordination of care.     LABWORK COMPLETED PRIOR TO THIS VISIT:  Office Visit on 07/07/2019   Component Date Value Ref Range Status   . Glucose (Urine) 07/07/2019 Negative  Negative mg/dL Final   . Bilirubin (Urine) 07/07/2019 Negative  Negative mg/dL Final   . Ketone (Urine) 07/07/2019 Negative  Negative mg/dL Final   . Specific Gravity (Urine) 07/07/2019 1.020  1.000 - 1.030 Final   . Blood (urine) 07/07/2019 Negative  Negative mg/dL Final   . pH (Urine) 16/08/9603 5.5  5.0 - 8.5 Final   . Protein (Urine) 07/07/2019 Negative  Negative mg/dL Final   . Urobilinogen (Urine) 07/07/2019 0.2 mg/dL  Negative mg/dL Final   . Nitrites (Urine) 07/07/2019 Positive  Negative mg/dL Final   . Leukocyte (Urine) 07/07/2019 Trace  Negative mg/dL Final   Lab Walk-In on 06/27/2019   Component Date Value Ref Range Status   . Hepatitis B Surface Antibody Inter* 06/27/2019 Immune  Immune Final   Lab Walk-In on 05/04/2019   Component Date Value Ref Range Status   . Progesterone 05/04/2019 1.0  <50.8 ng/mL Final   . Estrone 05/04/2019 64  pg/mL Final   . Estradiol Fraction 05/04/2019 <10  pg/mL Final   . Estriol 05/04/2019 <0.07  <0.08 ng/mL Final       RADIOLOGY STUDIES COMPLETED PRIOR TO THIS VISIT:  No results found.    Health Maintenance Due  Health Maintenance Due   Topic Date Due   . INFLUENZA VACCINE  07/07/2019       Past Medical History:   Diagnosis Date   . Abnormal ultrasound 05/13/2017   . hx of Graves' disease 2002    s/p I131  in 2002   . Postablative hypothyroidism     Hypothyroidism-Dr. Leonia Reader 2002   .  Vitamin D  deficiency      Past Surgical History:   Procedure Laterality Date   . ABDOMINAL SURGERY     . APPENDECTOMY     . AUGMENTATION MAMMAPLASTY     . CERVICAL BIOPSY     . PANNICULECTOMY  2005    tummy tuck   . PROCEDURE Right 06/27/2017    Procedure: LAPAROSCOPY;  ADHESIOLYSIS; ARGON BEAM TREATMENT OF EXTENSIVE ENDOMETRIOSIS;  Surgeon: Rolene Arbour, MD;  Location: [facility] OR;  Service: Gynecology;  Laterality: Right;   . PROCEDURE N/A 11/08/2017    Procedure: COLONOSCOPY with sedation;  Surgeon: Olin Pia, MD;  Location: [facility] ENDO;  Service: Endoscopy;  Laterality: N/A;   . PROCEDURE Right 09/18/2018    Procedure: LAPAROSCOPY; RIGHTSALPINGO-OOPHORECTOMY; APPENDECTOMY;  LYSIS OF PERIADENEXAL ADHESIONS;  Surgeon: Rolene Arbour, MD;  Location: [facility] OR;  Service: Gynecology;  Laterality: Right;   . TUBAL LIGATION      Tubal ligation 2008     Family History   Problem Relation Age of Onset   . Diabetes Mother    . Heart attack Mother         during a heart cath   . Lung cancer Mother         stage III   . Tobacco Dependence Mother    . Heart attack Father    . Tobacco Dependence Father    . No Known Health Problems Son    . Diabetes type II Maternal Grandmother    . Hypertension Maternal Grandmother    . Diabetes type II Paternal Grandmother    . Hypertension Paternal Grandmother    . Coronary artery disease Paternal Grandfather    . Heart attack Paternal Grandfather    . No Known Health Problems Son    . Breast cancer Neg Hx    . Ovarian cancer Neg Hx       Social History     Socioeconomic History   . Marital status: Married     Spouse name: Jerrod   . Number of children: 2   . Years of education: Not on file   . Highest education level: Not on file   Tobacco Use   . Smoking status: Never Smoker   . Smokeless tobacco: Never Used   . Tobacco comment: passive smoke exposure   Substance and Sexual Activity   . Alcohol use: Yes     Alcohol/week: 0.0 standard drinks     Comment: occasional    . Drug use: No   . Sexual  activity: Yes     Partners: Male     Birth control/protection: Surgical     Comment: Tubal Ligation   Other Topics Concern   . Caffeine Concern Yes     Comment: moderate   . Exercise Yes     Comment: occasional        I had a thoughtful and careful discussion regarding the issues today. All questions were answered. I educated and reassured. I encouraged followup as needed. Hand written instructions were given via the After Visit Summary including reference literature as appropriate. Today's medical decision maker  verbalize understanding of the treatment plan and are in agreement with the treatment plan goals. Medical decision maker is aware of how to contact clinical provider if concerns arise.      FOLLOW-UP:  Return in about 10 days (around 07/17/2019), or if symptoms worsen or fail to improve.   No future appointments.    This note was transcribed using  speech recognition software. Please contact us for clarification if any questions arise relating to the wording of this document.

## 2019-07-07 NOTE — Patient Instructions (Signed)
Urinary Frequency, Adult    Urinary frequency means urinating more often than usual. People with urinary frequency urinate at least 8 times in 24 hours, even if they drink a normal amount of fluid. Although they urinate more often than normal, the total amount of urine produced in a day may be normal. Urinary frequency is also called pollakiuria.  What are the causes?  This condition may be caused by:  ? A urinary tract infection.  ? Obesity.  ? Bladder problems, such as bladder stones.  ? Caffeine or alcohol.  ? Eating food or drinking fluids that irritate the bladder. These include coffee, tea, soda, artificial sweeteners, citrus, tomato-based foods, and chocolate.  ? Certain medicines, such as medicines that help the body get rid of extra fluid (diuretics).  ? Muscle or nerve weakness.  ? Overactive bladder.  ? Chronic diabetes.  ? Interstitial cystitis.  ? In men, problems with the prostate, such as an enlarged prostate.  ? In women, pregnancy.  In some cases, the cause may not be known.  What increases the risk?  This condition is more likely to develop in:  ? Women who have gone through menopause.  ? Men with prostate problems.  ? People with a disease or injury that affects the nerves or spinal cord.  ? People who have or have had a condition that affects the brain, such as a stroke.  What are the signs or symptoms?  Symptoms of this condition include:  ? Feeling an urgent need to urinate often. The stress and anxiety of needing to find a bathroom quickly can make this urge worse.  ? Urinating 8 or more times in 24 hours.  ? Urinating as often as every 1 to 2 hours.  How is this diagnosed?  This condition is diagnosed based on your symptoms, your medical history, and a physical exam. You may have tests, such as:  ? Blood tests.  ? Urine tests.  ? Imaging tests, such as X-rays or ultrasounds.  ? A bladder test.  ? A test of your neurological system. This is the body system that senses the need to urinate.  ? A  test to check for problems in the urethra and bladder called cystoscopy.  You may also be asked to keep a bladder diary. A bladder diary is a record of what you eat and drink, how often you urinate, and how much you urinate. You may need to see a health care provider who specializes in conditions of the urinary tract (urologist) or kidneys (nephrologist).  How is this treated?  Treatment for this condition depends on the cause. Sometimes the condition goes away on its own and treatment is not necessary. If treatment is needed, it may include:  ? Taking medicine.  ? Learning exercises that strengthen the muscles that help control urination.  ? Following a bladder training program. This may include:  ? Learning to delay going to the bathroom.  ? Double urinating (voiding). This helps if you are not completely emptying your bladder.  ? Scheduled voiding.  ? Making diet changes, such as:  ? Avoiding caffeine.  ? Drinking fewer fluids, especially alcohol.  ? Not drinking in the evening.  ? Not having foods or drinks that may irritate the bladder.  ? Eating foods that help prevent or ease constipation. Constipation can make this condition worse.  ? Having the nerves in your bladder stimulated. There are two options for stimulating the nerves to your bladder:  ?   Outpatient electrical nerve stimulation. This is done by your health care provider.  ? Surgery to implant a bladder pacemaker. The pacemaker helps to control the urge to urinate.  Follow these instructions at home:  ? Keep a bladder diary if told to by your health care provider.  ? Take over-the-counter and prescription medicines only as told by your health care provider.  ? Do any exercises as told by your health care provider.  ? Follow a bladder training program as told by your health care provider.  ? Make any recommended diet changes.  ? Keep all follow-up visits as told by your health care provider. This is important.  Contact a health care provider  if:  ? You start urinating more often.  ? You feel pain or irritation when you urinate.  ? You notice blood in your urine.  ? Your urine looks cloudy.  ? You develop a fever.  ? You begin vomiting.  Get help right away if:  ? You are unable to urinate.  This information is not intended to replace advice given to you by your health care provider. Make sure you discuss any questions you have with your health care provider.  Document Released: 08/18/2009 Document Revised: 11/23/2015 Document Reviewed: 05/18/2015  Elsevier Interactive Patient Education ? 2019 [employer]

## 2019-08-05 MED FILL — DEXTROAMPHETAMINE-AMPHETAMINE ER 15 MG 24HR CAPSULE,EXTEND RELEASE: 15 mg | ORAL | 28 days supply | Qty: 28 | Fill #0

## 2019-08-06 MED ORDER — liothyronine (CYTOMEL) 5 MCG tablet
5 | ORAL_TABLET | Freq: Every day | ORAL | 0 refills | 90.00000 days | Status: DC
Start: 2019-08-06 — End: 2019-11-26

## 2019-08-06 NOTE — Addendum Note (Signed)
Addended by: [name] on: 08/06/2019 06:20 PM     Modules accepted: Orders

## 2019-08-07 MED ORDER — liothyronine (CYTOMEL) 5 MCG tablet
5 | ORAL_TABLET | Freq: Every day | ORAL | 2 refills | 90.00000 days | Status: DC
Start: 2019-08-07 — End: 2020-02-08
  Filled 2019-08-12 – 2019-09-02 (×2): qty 30, 30d supply, fill #0

## 2019-08-11 MED FILL — NORGESTIMATE 0.25 MG-ETHINYL ESTRADIOL 0.035 MG TABLET: 0.25-0.035 mg | ORAL | 63 days supply | Qty: 84 | Fill #2

## 2019-08-21 ENCOUNTER — Ambulatory Visit: Attending: RN | Primary: Family

## 2019-08-21 DIAGNOSIS — Z23 Encounter for immunization: Secondary | ICD-10-CM

## 2019-08-26 MED ORDER — levothyroxine (SYNTHROID) 175 MCG tablet
175 | ORAL_TABLET | Freq: Every day | ORAL | 3 refills | Status: DC
Start: 2019-08-26 — End: 2020-01-05
  Filled 2019-09-02: qty 90, 90d supply, fill #0

## 2019-10-08 NOTE — Telephone Encounter (Addendum)
Last OV 07/07/2019. No upcoming appt. Labs UTD

## 2019-10-09 NOTE — Telephone Encounter (Signed)
I called Kerry Allen to schedule an appt, no answer. Unable to leave VM (phone only rings)

## 2019-10-09 NOTE — Telephone Encounter (Signed)
Will need an appt prior to next refill.  GAD, ASRS, PHQ-9

## 2019-10-10 MED ORDER — dextroamphetamine-amphetamine ER (ADDERALL XR) 15 MG 24 hr capsule
15 | ORAL_CAPSULE | Freq: Every morning | ORAL | 0 refills | 30.00000 days | Status: DC
Start: 2019-10-10 — End: 2019-11-26
  Filled 2019-10-16: qty 33, 33d supply, fill #0

## 2019-10-16 ENCOUNTER — Encounter: Payer: PRIVATE HEALTH INSURANCE | Attending: Family | Primary: Family

## 2019-11-11 MED FILL — LIOTHYRONINE 5 MCG TABLET: 5 ug | ORAL | 30 days supply | Qty: 30 | Fill #1

## 2019-11-26 ENCOUNTER — Telehealth: Admit: 2019-11-26 | Discharge: 2019-11-27 | Payer: PRIVATE HEALTH INSURANCE | Attending: Family | Primary: Family

## 2019-11-26 DIAGNOSIS — Z0000 Encounter for general adult medical examination without abnormal findings: Secondary | ICD-10-CM

## 2019-11-26 NOTE — Progress Notes (Signed)
Video Visit Progress Note     Kerry Allen a 44 y.o. female is being seen today via live audio/video using MyChart for Headache  .     The patient's current location is [address] [place] [address].  The provider's location during this telemedicine visit is My home office at the following zip: [phone].    Patient's Name and Date of Birth confirmed: done    This telemedicine visit is being conducted due to current COVID-19 restrictions.  Informed patient that although telemedicine have great potential benefits, not everything can be addressed during a telemedicine visit. As with any medical procedure there are potential risks, such as insufficient information for good medical decision making and risks to private information. Risk to private information is higher when using third-party apps like Facetime/GoogleDuo/Skype. Facetime/GoogleDuo/Skype visits will only allowed during the COVID-19 national emergency. Telemedicine is also limited to certain non-emergent concerns.     Instructed patient/guardian: If connection is lost and video does not automatically reconnect, patient's contact number is: Cell Number [phone]    Provider read the above statement and patient consented to continue with the visit: Yes    History for today's visit obtained from patient. The Patient is the medical decision(s) maker attending today's appointment.    ASSESSMENT AND PLAN:  Problem List Items Addressed This Visit        Active Problems    Attention deficit disorder (ADD) in adult    Relevant Medications    dextroamphetamine-amphetamine ER (ADDERALL XR) 15 MG 24 hr capsule    Avitaminosis    Relevant Orders    Vitamin B6 -Routine    Hormone replacement therapy (HRT)    Relevant Orders    Estradiol -Routine (Completed)    Progesterone Panel -Routine (Completed)    Hyperlipidemia    Relevant Medications    hydroCHLOROthiazide (HYDRODIURIL) 25 MG tablet    Other Relevant Orders    Comprehensive Metabolic Panel -Routine  (Completed)    Coronary Risk Lipid Panel w/reflex Direct LDL if triglycerides >400 mg/dL -Routine (Completed)    Hypothyroidism (Chronic)    Relevant Orders    T4, Free -Routine (Completed)    Thyroid Stimulating Hormone -Routine (Completed)    T3, Free -Routine (Completed)    Screening for depression    Vitamin D deficiency      Other Visit Diagnoses     Laboratory examination ordered as part of a routine general medical examination    -  Primary    Relevant Orders    CBC with Auto Differential -Routine (Completed)    Comprehensive Metabolic Panel -Routine (Completed)    Coronary Risk Lipid Panel w/reflex Direct LDL if triglycerides >400 mg/dL -Routine (Completed)    T4, Free -Routine (Completed)    Thyroid Stimulating Hormone -Routine (Completed)    25-OH Vitamin D Total D2+D3 -Routine (Completed)    T3, Free -Routine (Completed)    Estradiol -Routine (Completed)    Progesterone Panel -Routine (Completed)    Vitamin B6 -Routine    Labile hypertension        Relevant Medications    hydroCHLOROthiazide (HYDRODIURIL) 25 MG tablet          Plan:     Check fasting bloodwork ordered today.     Please keep a blood pressure log.  Start on hydrochlorothiazide 12.5 mg if blood pressure is 120/80 or higher.  If blood pressure is greater than 150/90 then take the whole 25 mg.    Continue current medications  and supplements.    Take melatonin as the sun goes down.    Add on a complete B complex 1 cap daily.          Support for the above plan obtained during this visit:    HPI    Headaches/Blood Pressure:  Complains of headache with higher blood pressure for about the last 3 weeks.  Extended family has been living with her for about 2 months. BP was 138/90 but has been as high as the 170/90's.     States that she has not been taking the adderall XR everyday. Usually skips the weekends, but states it is because she     Depression/Anxiety  States that overall she is doing ok, but that the past 2 months have been stressful.  States that she is also feeling worn down due to the pandemic and isolated. states that she has been taking just B12 but not a full B complex, which is most likely contributing to the melatonin not working.       GAD7 11/26/2019 05/05/2019 01/06/2019   1. Feeling nervous, anxious or on edge More than half the days Several days Several days   2. Not being able to stop or control worrying More than half the days Several days Several days   3. Worrying too much about different things More than half the days Several days Several days   4. Trouble relaxing More than half the days Not at all Several days   5. Being so restless that it is hard to sit still Several days Not at all Not at all   6. Becoming easily annoyed or irritable Nearly every day Several days Not at all   7. Feeling afraid as if something awful might happen Not at all Not at all Not at all   If you checked off any problem on this questionnaire so far, how difficult have these problems made it for you to do your work, take care of things at home, or get along with other people? Very difficult Somewhat difficult -   GAD7 Total 12 4 4        Depression Screening Questionnaire 11/26/2019 05/05/2019 01/06/2019   During the past two weeks, have you been bothered by little interest or pleasure in doing things? Several Days Not at all Not at all   During the past two weeks, have you been bothered by feeling down, depressed, or hopeless? Not at all Several Days Not at all   Little interest or pleasure in doing things Several days Not at all Not at all   Feeling down, depressed, or hopeless Not at all Several days Not at all   Trouble falling or staying asleep, or sleeping too much More than half the days Several days -   Feeling tired or having little energy Nearly every day Not at all -   Poor appetite or overeating Not at all Several days -   Feeling bad about yourself - or that you are a failure or have let yourself or your family down Several days Not at all -      Trouble concentrating on things, such as reading the newspaper or watching television More than half the days Several days -   Moving or speaking so slowly that other people could have noticed. Or the opposite - being so fidgety or restless that you have been moving around a lot more than usual More than half the days Not at all -  Thoughts that you would be better off dead, or of hurting yourself in some way Not at all Not at all -   PHQ-9 Score 11 4 0   How difficult have these problems made it for you to do your work, take care of things at home, or get along with other people?  Somewhat difficult Not difficult at all -       MOOD DISORDER                                 Has there ever been a period of time when you were not your usual self and... 11/26/2019 05/05/2019 01/06/2019   ...you felt so good or so hyper that other people thought you were not your normal self or you were so hyper that you got into trouble? 0 0 0   ...you were so irritable that you shouted at people or started fights or arguments? 0 0 0   ...you felt much more self-confident than usual? 0 0 0   ...you got much less sleep than usual and found that you didn't really miss it? 0 0 1   ...you were more talkative or spoke much faster than usual? 1 0 0   ...thoughts raced through your head or you couldn't slow your mind down? 1 0 0   ...you were so easily distracted by things around you that you had trouble concentrating or staying on track? 1 1 0   ...you had more energy than usual? 0 0 0   ...you were much more active or did many more things than usual? 0 0 0   ...you were much more social or outgoing than usual, for example, you telephoned friends in the middle of the night? 0 0 0   ...you were much more interested in sex than usual? 0 1 0   ...you did things that were unusual for you or that other people might have thought were excessive, foolish, or risky? 0 0 0   ...spending money got you or your family in trouble? 0 0 0   If you have  checked YES to more than one of the above, have several of these ever happened during the same period of time? No Yes -   How much of a problem did any of these cause you - like being unable to work; having family, money or legal troubles; getting into arguments or fights? No Problems No Problems -   Mood 3 2 1        ADHD:  Adult ADHD Self-Report Scale (ASRS-V1.1) Symptom Checklist 11/26/2019 05/05/2019 01/19/2019 03/21/2018   How often do you have trouble wrapping up the final details of a project, once the challenging parts have been done? Rarely Rarely Sometimes Sometimes   How often do you have difficulty getting things in order when you have to do a task that requries organization? Often Rarely Rarely Rarely   How often do you have problems remembering appointments or obligations? Often Sometimes Often Sometimes   When you have a task that requires a lot of thought, how often do you avoid or delay getting started? Often Sometimes Sometimes Sometimes   How often do you fidget or squirm with your hands or feet when you have to sit down for a long time? Often Rarely Often Sometimes   How often do you feel overly active and compelled to do things, like you were driven by a  motor? Sometimes Rarely Rarely Rarely   How often do you make careless mistakes when you have to work on a boring or difficult project? Sometimes Sometimes Sometimes Often   How often do you have difficulty keeping your attention when you are doing boring or repetitive work? Sometimes Sometimes Sometimes Often   How often do you have difficulty concentrating on what people say to you, even when they are speaking to you directly? Often Sometimes Rarely Sometimes   How often do you misplace or have difficulty finding things at home or at work? Very Often Often Often Very Often   How often are you distracted by activity or noise around you? Sometimes Sometimes Sometimes Often   How often do you leave your seat in meetings or other situations in which  you are expected to remain seated? Sometimes Never Never Rarely   How often do you feel restless or fidgety? Often Rarely Sometimes Sometimes   How often do you have difficulty unwinding and relaxing when you have time to yourself? Often Rarely Rarely Sometimes   How often do you find yourself talking too much when you are in social situations? Sometimes Sometimes Often Sometimes   When you're in a conversation, how often do you find yourself finishing the sentences of the people you are talking to, before they can finish them themselves? Sometimes Sometimes Often Very Often   How often do you have difficulty waiting your turn in situations when turn taking is required? Sometimes Rarely Rarely Sometimes   How often do you interrupt others when they are busy? Sometimes Sometimes Sometimes Often         ALLERGIES:  Allergies   Allergen Reactions   . Penicillins Rash     When she was a child       Outpatient Medications Marked as Taking for the 11/26/19 encounter (Telemedicine2) with [name], FNP   Medication Sig Dispense Refill   . cholecalciferol, vitamin D3, (VITAMIN D3) 5,000 unit Tab Take by mouth.     . dextroamphetamine-amphetamine (ADDERALL) 5 mg Tab Take 1 tablet by mouth daily between 2:00PM and 3:00PM 28 tablet 0   . dextroamphetamine-amphetamine ER (ADDERALL XR) 15 MG 24 hr capsule Take 1 capsule by mouth every morning for 28 days. 28 capsule 0   . INOSITOL, BULK, MISC by Miscellaneous route.     Marland Kitchen levomefolate calcium (L-METHYLFOLATE ORAL) Take 5 mg by mouth.     . levothyroxine (SYNTHROID) 175 MCG tablet Take 1 tablet by mouth daily. 90 tablet 3   . liothyronine (CYTOMEL) 5 MCG tablet Take 1 tablet by mouth daily. 30 tablet 2   . norgestimate-ethinyl estradioL (SPRINTEC, 28,) 0.25-35 mg-mcg per tablet Take 1 tablet by mouth daily for 3 weeks then start the next pill pack, skip placebo tablets. 84 tablet 3   . [DISCONTINUED] B-complex vitamin tablet Take 1 tablet by mouth daily. B-Right; Glo Herring     . [DISCONTINUED] dextroamphetamine-amphetamine ER (ADDERALL XR) 15 MG 24 hr capsule Take 1 capsule by mouth every morning. 33 capsule 0   . [DISCONTINUED] liothyronine (CYTOMEL) 5 MCG tablet Take 1 tablet by mouth daily. 30 tablet 0   . [DISCONTINUED] mupirocin (BACTROBAN) 2 % ointment Apply to affected area 2 times daily. 22 g 0       VITALS:  Vitals:    11/26/19 0858   BP: 138/90   Weight: 170 lb (77.1 kg)   Pulse: 95       Vitals:  11/26/19 0858   Pulse: 95     There is no height or weight on file to calculate BMI.  No LMP recorded. Patient has had an ablation.    Patient weight not recorded     Wt Readings from Last 3 Encounters:   07/07/19 169 lb (76.7 kg)   05/05/19 168 lb (76.2 kg)   01/19/19 168 lb (76.2 kg)       Review of Systems   Constitutional: Negative for chills, fever and malaise/fatigue.   HENT: Negative for congestion, ear pain and sore throat.    Eyes: Negative for redness.   Respiratory: Positive for chest tightness and shortness of breath. Negative for cough.    Cardiovascular: Positive for palpitations. Negative for chest pain and leg swelling.   Gastrointestinal: Negative for abdominal pain, constipation, diarrhea, nausea and vomiting.   Musculoskeletal: Negative for joint pain and myalgias.   Skin: Negative for rash.   Neurological: Positive for headaches. Negative for dizziness.   Endo/Heme/Allergies: Does not bruise/bleed easily.   Psychiatric/Behavioral: Positive for depression. Negative for substance abuse and suicidal ideas. The patient is nervous/anxious. The patient does not have insomnia.        Also see HPI for ROS    Physical Exam   Psychiatric:   APPEARANCE: neatly groomed and appropriate dress for age and situation   CONCIOUSNESS: awake, Responds Appropriately, and alert ,   ORIENTATION: person, place, and time/date  GAIT/STATION: normal  MUSCLE STRENGTH/TONE: normal  EYE CONTACT: Good eye contact  PSYCHOMOTOR ACTIVITY: within normal limits  INVOLUNTARY MOVEMENTS: not  present  SPEECH: Ordinary, Clear, Offers information, Normal  MOOD: Pleasant  AFFECT: mood-congruent  BEHAVIOR:  cooperative,  friendly, relaxed, and good rapport  ENERGY LEVEL: good  THOUGHT PROCESS:  normal  THOUGHT  Relevant to topic being discussed  THOUGHT Content: appropriate  INSIGHT/JUDGMENT: age appropriate  MEMORY:  intact  ATTENTION/CONCENTRATION: Sufficient and Adequate concentration  FUND OF KNOWLEDGE:   grossly intact, is aware of current events, is appropriate for age  LEVEL OF SELF ESTEEM:  normal      *Any tenderness or palpable anomaly was elicited/reported by patient or caregiver through provider instructed exam.      Items reviewed in preparation for this visit:  The following portions of the patient's history were reviewed and updated as appropriate: allergies, current medications, past family history, past medical history, past surgical history and problem list.  When necessary and appropriate, prior medical records, medical history from family members or other providers were reviewed for coordination of care.       LABWORK COMPLETED PRIOR TO THIS VISIT: none      RADIOLOGY STUDIES COMPLETED PRIOR TO THIS VISIT:  No results found.    Health Maintenance Due  Health Maintenance Due   Topic Date Due   . COVID-19 Vaccine (1 of 2) 08/03/1990       Past Medical History:   Diagnosis Date   . Abnormal ultrasound 05/13/2017   . hx of Graves' disease 2002    s/p I131  in 2002   . Postablative hypothyroidism     Hypothyroidism-Dr. Leonia Reader 2002   . Vitamin D deficiency      Past Surgical History:   Procedure Laterality Date   . ABDOMINAL SURGERY     . APPENDECTOMY     . AUGMENTATION MAMMAPLASTY     . CERVICAL BIOPSY     . PANNICULECTOMY  2005    tummy tuck   . PROCEDURE Right 06/27/2017  Procedure: LAPAROSCOPY;  ADHESIOLYSIS; ARGON BEAM TREATMENT OF EXTENSIVE ENDOMETRIOSIS;  Surgeon: Rolene Arbour, MD;  Location: [facility] OR;  Service: Gynecology;  Laterality: Right;   . PROCEDURE N/A 11/08/2017    Procedure:  COLONOSCOPY with sedation;  Surgeon: Olin Pia, MD;  Location: [facility] ENDO;  Service: Endoscopy;  Laterality: N/A;   . PROCEDURE Right 09/18/2018    Procedure: LAPAROSCOPY; RIGHTSALPINGO-OOPHORECTOMY; APPENDECTOMY;  LYSIS OF PERIADENEXAL ADHESIONS;  Surgeon: Rolene Arbour, MD;  Location: [facility] OR;  Service: Gynecology;  Laterality: Right;   . TUBAL LIGATION      Tubal ligation 2008     Family History   Problem Relation Age of Onset   . Diabetes Mother    . Heart attack Mother         during a heart cath   . Lung cancer Mother         stage III   . Tobacco Dependence Mother    . Heart attack Father    . Tobacco Dependence Father    . No Known Health Problems Son    . Diabetes type II Maternal Grandmother    . Hypertension Maternal Grandmother    . Diabetes type II Paternal Grandmother    . Hypertension Paternal Grandmother    . Coronary artery disease Paternal Grandfather    . Heart attack Paternal Grandfather    . No Known Health Problems Son    . Breast cancer Neg Hx    . Ovarian cancer Neg Hx       Social History     Socioeconomic History   . Marital status: Married     Spouse name: Jerrod   . Number of children: 2   . Years of education: Not on file   . Highest education level: Not on file   Tobacco Use   . Smoking status: Never Smoker   . Smokeless tobacco: Never Used   . Tobacco comment: passive smoke exposure   Substance and Sexual Activity   . Alcohol use: Yes     Alcohol/week: 0.0 standard drinks     Comment: occasional    . Drug use: No   . Sexual activity: Yes     Partners: Male     Birth control/protection: Surgical     Comment: Tubal Ligation   Other Topics Concern   . Caffeine Concern Yes     Comment: moderate   . Exercise Yes     Comment: occasional        I had a thoughtful and careful discussion regarding the issues today. All questions were answered. I educated and reassured. I encouraged followup as needed. Hand written instructions were given via the After Visit Summary including reference  literature as appropriate. Today's medical decision maker  verbalize understanding of the treatment plan and are in agreement with the treatment plan goals. Medical decision maker is aware of how to contact clinical provider if concerns arise.    I have recommended the patient seek medical attention right away for new or worsening symptoms.  For questions regarding your plan of care, please contact  [phone].    FOLLOW-UP:  Return in about 3 weeks (around 12/17/2019) for F/U to review lab results ordered at today's visit.     This chart was produced with the Epic system using voice recognition software, manual transcription, or both. Despite concurrent proofreading, please note that transcription errors are common and may not reflect the true intent of reporter.  Please contact us for clarification if  any questions arise relating to the wording of this document.

## 2019-11-27 MED ORDER — hydroCHLOROthiazide (HYDRODIURIL) 25 MG tablet
25 | ORAL_TABLET | ORAL | 2 refills | Status: DC
Start: 2019-11-27 — End: 2021-01-31
  Filled 2019-11-27: qty 30, 30d supply, fill #0

## 2019-11-27 MED ORDER — b complex vitamins capsule
ORAL | 0.00 refills | 95.00000 days | Status: AC
Start: 2019-11-27 — End: ?

## 2019-11-27 MED ORDER — dextroamphetamine-amphetamine ER (ADDERALL XR) 15 MG 24 hr capsule
15 | ORAL_CAPSULE | Freq: Every morning | ORAL | 0 refills | Status: DC
Start: 2019-11-27 — End: 2020-01-05
  Filled 2019-11-27: qty 28, 28d supply, fill #0

## 2019-12-01 ENCOUNTER — Other Ambulatory Visit: Admit: 2019-12-01 | Discharge: 2019-12-01 | Payer: PRIVATE HEALTH INSURANCE | Primary: Family

## 2019-12-01 DIAGNOSIS — Z0000 Encounter for general adult medical examination without abnormal findings: Secondary | ICD-10-CM

## 2019-12-01 LAB — COMPREHENSIVE METABOLIC PANEL
ALT - Alanine Aminotransferase: 9 IU/L (ref 7–52)
AST - Aspartate Aminotransferase: 9 IU/L (ref 8–39)
Albumin/Globulin Ratio: 1.3 (ref >0.9)
Albumin: 3.9 g/dL (ref 3.5–5.0)
Alkaline Phosphatase: 69 IU/L (ref 34–104)
Anion Gap: 6 mmol/L (ref 4–13)
BUN: 16 mg/dL (ref 6–23)
Bilirubin Total: 0.3 mg/dL (ref 0.3–1.2)
CO2 - Carbon Dioxide: 30 mmol/L (ref 21–31)
Calcium: 9.1 mg/dL (ref 8.6–10.3)
Chloride: 105 mmol/L (ref 98–111)
Creatinine: 0.75 mg/dL (ref 0.55–1.10)
GFR Estimated Female: >60 mL/min/{1.73_m2} (ref >=60)
Globulin: 3.1 g/dL (ref 2.2–3.7)
Glucose: 90 mg/dL (ref 80–99)
Potassium: 4.3 mmol/L (ref 3.5–5.1)
Protein Total: 7 g/dL (ref 6.0–8.0)
Sodium: 141 mmol/L (ref 135–143)

## 2019-12-01 LAB — CBC WITH AUTO DIFFERENTIAL
Basophils %: 1 % (ref 0–2)
Basophils, Absolute: 0.1 10*3/ÂµL (ref 0.0–0.2)
Eosinophils %: 5 % (ref 0–7)
Eosinophils, Absolute: 0.4 10*3/ÂµL (ref 0.0–0.7)
HCT: 40.1 % (ref 37.0–48.0)
Hemoglobin: 13.4 g/dL (ref 12.0–16.0)
Lymphocytes %: 27 % (ref 25–45)
Lymphocytes, Absolute: 2.2 10*3/??L (ref 1.1–4.3)
MCH: 29.2 pg (ref 27.0–34.0)
MCHC: 33.3 g/dL (ref 32.0–36.0)
MCV: 87.7 fL (ref 81.0–99.0)
MPV: 8.7 fL (ref 7.4–10.4)
Monocytes %: 5 % (ref 0–12)
Monocytes, Absolute: 0.4 10*3/ÂµL (ref 0.0–1.2)
Neutrophils %: 61 % (ref 35–70)
Neutrophils, Absolute: 5 10*3/??L (ref 1.6–7.3)
Platelet Count: 347 10*3/ÂµL (ref 150–400)
RBC: 4.57 10*6/ÂµL (ref 4.20–5.40)
RDW: 12.9 % (ref 11.5–14.5)
WBC: 8.2 10*3/ÂµL (ref 4.8–10.8)

## 2019-12-01 LAB — CORONARY RISK LIPID PANEL REFLEX DIRECT LDL
Cholesterol, HDL: 59 mg/dL (ref >=40)
Cholesterol: 225 mg/dL — ABNORMAL HIGH (ref <200)
LDL Calculated: 145 mg/dL — ABNORMAL HIGH (ref <100)
Triglyceride: 106 mg/dL (ref 30–149)

## 2019-12-01 LAB — TSH: TSH - Thyroid Stimulating Hormone: 4.05 u[IU]/mL (ref 0.45–5.33)

## 2019-12-01 LAB — T4, FREE: T4, Free: 0.9 ng/dL (ref 0.6–1.2)

## 2019-12-01 LAB — T3, FREE: T3, Free: 3.6 pg/mL (ref 2.5–3.9)

## 2019-12-01 LAB — ESTRADIOL: Estradiol: <15 pg/mL

## 2019-12-01 LAB — PROGESTERONE: Progesterone: 0.7 ng/mL (ref <50.8)

## 2019-12-01 LAB — VITAMIN B6: Vitamin B6 (Pyridoxal 5-Phosphate): 2 mcg/L — ABNORMAL LOW (ref 5–50)

## 2019-12-01 LAB — 25-OH HYDROXY VITAMIN D, CALCIFEROL, TOTAL OF D2 & D3: Vitamin D, 25-OH Total: 34.6 ng/mL (ref 30.0–100.0)

## 2019-12-07 MED FILL — NORGESTIMATE 0.25 MG-ETHINYL ESTRADIOL 0.035 MG TABLET: 0.25-0.035 mg | ORAL | 63 days supply | Qty: 84 | Fill #3

## 2019-12-15 NOTE — Telephone Encounter (Signed)
Attempted to call patient. No answer. Left message for patient to call back.

## 2019-12-15 NOTE — Telephone Encounter (Addendum)
----- Message from [name], FNP sent at 12/15/2019  6:46 AM PST -----  Abnormal lab. Please call patient and schedule office visit to discuss results.      Recent Results (from the past 672 hour(s))   Vitamin B6 -Routine    Collection Time: 12/01/19  6:44 AM   Result Value Ref Range    Vitamin B6 (Pyridoxal 5-Phosphate) 2 (L) 5 - 50 mcg/L   Progesterone Panel -Routine    Collection Time: 12/01/19  6:44 AM   Result Value Ref Range    Progesterone 0.7 <50.8 ng/mL   Estradiol -Routine    Collection Time: 12/01/19  6:44 AM   Result Value Ref Range    Estradiol <15.0 See Mnemonic Comment pg/mL   T3, Free -Routine    Collection Time: 12/01/19  6:44 AM   Result Value Ref Range    T3, Free 3.6 2.5 - 3.9 pg/mL   25-OH Vitamin D Total D2+D3 -Routine    Collection Time: 12/01/19  6:44 AM   Result Value Ref Range    Vitamin D 25-OH Total 34.6 30.0 - 100.0 ng/mL   Thyroid Stimulating Hormone -Routine    Collection Time: 12/01/19  6:44 AM   Result Value Ref Range    TSH - Thyroid Stimulating Hormone 4.05 0.45 - 5.33 ?IU/mL   T4, Free -Routine    Collection Time: 12/01/19  6:44 AM   Result Value Ref Range    T4, Free 0.9 0.6 - 1.2 ng/dL   Coronary Risk Lipid Panel w/reflex Direct LDL if triglycerides >400 mg/dL -Routine    Collection Time: 12/01/19  6:44 AM   Result Value Ref Range    Cholesterol 225 (H) <200 mg/dL    Cholesterol, HDL 59 >=40 mg/dL    Triglyceride 161 30 - 149 mg/dL    LDL Calculated 096 (H) <100 mg/dL   Comprehensive Metabolic Panel -Routine    Collection Time: 12/01/19  6:44 AM   Result Value Ref Range    Sodium 141 135 - 143 mmol/L    Potassium 4.3 3.5 - 5.1 mmol/L    Chloride 105 98 - 111 mmol/L    CO2 - Carbon Dioxide 30 21 - 31 mmol/L    Glucose 90 80 - 99 mg/dL    BUN 16 6 - 23 mg/dL    Creatinine 0.45 4.09 - 1.10 mg/dL    Calcium 9.1 8.6 - 81.1 mg/dL    AST - Aspartate Aminotransferase 9 8 - 39 IU/L    ALT - Alanine Aminotransferase 9 7 - 52 IU/L    Alkaline Phosphatase 69 34 - 104 IU/L    Bilirubin  Total 0.3 0.3 - 1.2 mg/dL    Protein Total 7.0 6.0 - 8.0 g/dL    Albumin 3.9 3.5 - 5.0 g/dL    Globulin 3.1 2.2 - 3.7 g/dL    Albumin/Globulin Ratio 1.3 >0.9    Anion Gap 6 4 - 13 mmol/L    GFR Estimated Female >60 >=60 mL/min/1.84m*2    GFR Additional Info     CBC with Auto Differential -Routine    Collection Time: 12/01/19  6:44 AM   Result Value Ref Range    WBC 8.2 4.8 - 10.8 10*3/?L    RBC 4.57 4.20 - 5.40 10*6/?L    Hemoglobin 13.4 12.0 - 16.0 g/dL    HCT 91.4 78.2 - 95.6 %    MCV 87.7 81.0 - 99.0 fL    MCH 29.2 27.0 - 34.0 pg  MCHC 33.3 32.0 - 36.0 g/dL    RDW 95.2 84.1 - 32.4 %    Platelet Count 347 150 - 400 10*3/?L    MPV 8.7 7.4 - 10.4 fL    Neutrophils % 61 35 - 70 %    Lymphocytes % 27 25 - 45 %    Monocytes % 5 0 - 12 %    Eosinophils % 5 0 - 7 %    Basophils % 1 0 - 2 %    Neutrophils, Absolute 5.0 1.6 - 7.3 10*3/?L    Lymphocytes, Absolute 2.2 1.1 - 4.3 10*3/?L    Monocytes, Absolute 0.4 0.0 - 1.2 10*3/?L    Eosinophils, Absolute 0.4 0.0 - 0.7 10*3/?L    Basophils, Absolute 0.1 0.0 - 0.2 10*3/?L    Differential Type Automated Differential

## 2019-12-16 NOTE — Telephone Encounter (Signed)
Attempted to call patient. No answer. Left message for patient to call back.

## 2019-12-17 NOTE — Telephone Encounter (Signed)
Patient scheduled.    Future Appointments   Date Time Provider Department Center   01/05/2020  4:00 PM [name], FNP [facility] APP [facility]ics

## 2019-12-24 MED FILL — LIOTHYRONINE 5 MCG TABLET: 5 ug | ORAL | 30 days supply | Qty: 30 | Fill #2

## 2020-01-05 ENCOUNTER — Ambulatory Visit: Admit: 2020-01-05 | Discharge: 2020-01-13 | Payer: PRIVATE HEALTH INSURANCE | Attending: Family | Primary: Family

## 2020-01-05 DIAGNOSIS — E039 Hypothyroidism, unspecified: Secondary | ICD-10-CM

## 2020-01-05 NOTE — Progress Notes (Signed)
Kerry Allen is a 44 y.o. female seen today for Lab Results  .    Patient is alone. The Patient is the medical decision(s) maker attending today's appointment.    ASSESSMENT AND PLAN:  Problem List Items Addressed This Visit        Active Problems    Attention deficit disorder (ADD) in adult    Relevant Medications    dextroamphetamine-amphetamine ER (ADDERALL XR) 15 MG 24 hr capsule    Hyperlipidemia    Relevant Medications    rosuvastatin (CRESTOR) 5 MG tablet    Other Relevant Orders    Coronary Risk Lipid Panel w/reflex Direct LDL if triglycerides >400 mg/dL -Routine    Comprehensive Metabolic Panel -Routine    Hypothyroidism - Primary (Chronic)    Relevant Medications    levothyroxine (SYNTHROID) 200 MCG tablet    Other Relevant Orders    T3, Free -Routine    Thyroid Stimulating Hormone -Routine    T4, Free -Routine          Plan:     Recommend that you start on N-acetylcysteine 600 mg twice a day to help lower your inflammatory markers and prevent free radicals (the chemicals in our bodies that increase risk of cancer).    Increase dose of levo to 200 mcg daily.     Change vitamin D3  IU to 1 cap daily and on Monday, Wednesday and Friday take 2 caps daily. Recheck level in 2 months.        Support for the above plan obtained during this visit:    HPI  Lab Results  Pt here to discuss the following lab results as listed below. Pt educated regarding the findings and treatment plan developed.     Hypothyroidism  TSH was 4.06-free T3 and T4 were normal. currnetly taking 175 mcg daily of levo and 5 mg daily of Cytomel.     Adult ADD  Patient states that she has been off her medication for a month.  States that she would like to restart the medication as she has noticed that her concentration and ability to finish tasks has significantly dropped off.  States that she sleeps well on the medication..    ALLERGIES:  Allergies   Allergen Reactions   . Penicillins Rash     When she was a child       Outpatient  Medications Marked as Taking for the 01/05/20 encounter (Office Visit) with [name], FNP   Medication Sig Dispense Refill   . b complex vitamins capsule One Elevated Methylfolate+ (B-Complex) 1 cap every morning     . hydroCHLOROthiazide (HYDRODIURIL) 25 MG tablet Take 1 tablet by mouth daily for hypertension. 30 tablet 2   . INOSITOL, BULK, MISC by Miscellaneous route.     Marland Kitchen liothyronine (CYTOMEL) 5 MCG tablet Take 1 tablet by mouth daily. 30 tablet 2   . norgestimate-ethinyl estradioL (SPRINTEC, 28,) 0.25-35 mg-mcg per tablet Take 1 tablet by mouth daily for 3 weeks then start the next pill pack, skip placebo tablets. 84 tablet 3   . cholecalciferol, vitamin D3, (VITAMIN D3) 5,000 unit Tab Take by mouth.     . dextroamphetamine-amphetamine (ADDERALL) 5 mg Tab Take 1 tablet by mouth daily between 2:00PM and 3:00PM 28 tablet 0   . levomefolate calcium (L-METHYLFOLATE ORAL) Take 5 mg by mouth.     . levothyroxine (SYNTHROID) 175 MCG tablet Take 1 tablet by mouth daily. 90 tablet 3  VITALS:  Vitals:    01/05/20 1613   BP: 118/82   Pulse: 100   Resp: 20   Temp: 36.8 ?C (98.2 ?F)   TempSrc: Oral   SpO2: 99%   Weight: 175 lb (79.4 kg)   Height: 5' 2 (1.575 m)     Body mass index is 32.01 kg/m?Marland Kitchen  No LMP recorded. Patient has had an ablation.    Ideal body weight: 110 lb 7.2 oz (50.1 kg)  Adjusted ideal body weight: 136 lb 4.3 oz (61.8 kg)     Wt Readings from Last 3 Encounters:   01/05/20 175 lb (79.4 kg)   07/07/19 169 lb (76.7 kg)   05/05/19 168 lb (76.2 kg)       Review of Systems   Constitutional: Negative for chills, diaphoresis, fatigue and fever.   HENT: Negative for congestion, sinus pain and sore throat.    Respiratory: Negative for cough.    Gastrointestinal: Negative for abdominal pain, constipation, diarrhea and nausea.   Genitourinary: Negative.    Musculoskeletal: Negative for back pain and neck pain.   Neurological: Negative for dizziness and headaches.       Physical Exam  Psychiatric:       Comments: APPEARANCE: Well developed, well nourished, adequately groomed, appropriate dress for stated age and present situation    BEHAVIOR: Good eye contact, Calm and cooperative  ORIENTATION: person, place and time/date  MEMORY:  Recent and remote grossly intact.  CONCIOUSNESS: awake and responds appropriately  SPEECH: Normal rate, tone and volume.  MOTOR: normal psychomotor activity, No evidence of tremors or dystonia. No psychomotor agitation or retardation. Steady gait, normal station.  MOOD:  Happy, pleasant   AFFECT: Congruent, full range and appropriate.  THOUGHT PROCESS/ASSOCIATIONS: Clarity is coherent, Linear thoughts are intact.   THOUGHT CONTENT: appropriate to present situation; Denies any suicidal or homicidal ideations. Denies any auditory or visual hallucinations. No overt evidence of any delusions or paranoia.  INSIGHT: fair  JUDGMENT: fair  IMPULSE CONTROL: fair  ATTENTION/CONCENTRATION: Sufficient/Intact and appropriate.  FUND OF KNOWLEDGE:  Normal/average  LEVEL OF SELF ESTEEM:  Normal       Items reviewed in preparation for this visit:  The following portions of the patient's history were reviewed and updated as appropriate: allergies, current medications, past family history, past medical history, past surgical history and problem list.  When necessary and appropriate, prior medical records, medical history from family members or other providers were reviewed for coordination of care.     LABWORK COMPLETED PRIOR TO THIS VISIT:  Lab Walk-In on 12/01/2019   Component Date Value Ref Range Status   . Vitamin B6 (Pyridoxal 5-Phosphate) 12/01/2019 2* 5 - 50 mcg/L Final   . Progesterone 12/01/2019 0.7  <50.8 ng/mL Final   . Estradiol 12/01/2019 <15.0  See Mnemonic Comment pg/mL Final   . T3, Free 12/01/2019 3.6  2.5 - 3.9 pg/mL Final   . Vitamin D 25-OH Total 12/01/2019 34.6  30.0 - 100.0 ng/mL Final   . TSH - Thyroid Stimulating Hormone 12/01/2019 4.05  0.45 - 5.33 ?IU/mL Final   . T4, Free  12/01/2019 0.9  0.6 - 1.2 ng/dL Final   . Cholesterol 16/08/9603 225* <200 mg/dL Final   . Cholesterol, HDL 12/01/2019 59  >=40 mg/dL Final   . Triglyceride 12/01/2019 106  30 - 149 mg/dL Final   . LDL Calculated 12/01/2019 540* <100 mg/dL Final   . Sodium 98/09/9146 141  135 - 143 mmol/L Final   .  Potassium 12/01/2019 4.3  3.5 - 5.1 mmol/L Final   . Chloride 12/01/2019 105  98 - 111 mmol/L Final   . CO2 - Carbon Dioxide 12/01/2019 30  21 - 31 mmol/L Final   . Glucose 12/01/2019 90  80 - 99 mg/dL Final   . BUN 16/08/9603 16  6 - 23 mg/dL Final   . Creatinine 54/07/8118 0.75  0.55 - 1.10 mg/dL Final   . Calcium 14/78/2956 9.1  8.6 - 10.3 mg/dL Final   . AST - Aspartate Aminotransferase 12/01/2019 9  8 - 39 IU/L Final   . ALT - Alanine Aminotransferase 12/01/2019 9  7 - 52 IU/L Final   . Alkaline Phosphatase 12/01/2019 69  34 - 104 IU/L Final   . Bilirubin Total 12/01/2019 0.3  0.3 - 1.2 mg/dL Final   . Protein Total 12/01/2019 7.0  6.0 - 8.0 g/dL Final   . Albumin 21/30/8657 3.9  3.5 - 5.0 g/dL Final   . Globulin 84/69/6295 3.1  2.2 - 3.7 g/dL Final   . Albumin/Globulin Ratio 12/01/2019 1.3  >0.9 Final   . Anion Gap 12/01/2019 6  4 - 13 mmol/L Final   . GFR Estimated Female 12/01/2019 >60  >=60 mL/min/1.28m*2 Final   . GFR Additional Info 12/01/2019    Final   . WBC 12/01/2019 8.2  4.8 - 10.8 10*3/?L Final   . RBC 12/01/2019 4.57  4.20 - 5.40 10*6/?L Final   . Hemoglobin 12/01/2019 13.4  12.0 - 16.0 g/dL Final   . HCT 28/41/3244 40.1  37.0 - 48.0 % Final   . MCV 12/01/2019 87.7  81.0 - 99.0 fL Final   . MCH 12/01/2019 29.2  27.0 - 34.0 pg Final   . MCHC 12/01/2019 33.3  32.0 - 36.0 g/dL Final   . RDW 11/07/7251 12.9  11.5 - 14.5 % Final   . Platelet Count 12/01/2019 347  150 - 400 10*3/?L Final   . MPV 12/01/2019 8.7  7.4 - 10.4 fL Final   . Neutrophils % 12/01/2019 61  35 - 70 % Final   . Lymphocytes % 12/01/2019 27  25 - 45 % Final   . Monocytes % 12/01/2019 5  0 - 12 % Final   . Eosinophils % 12/01/2019 5  0 - 7 %  Final   . Basophils % 12/01/2019 1  0 - 2 % Final   . Neutrophils, Absolute 12/01/2019 5.0  1.6 - 7.3 10*3/?L Final   . Lymphocytes, Absolute 12/01/2019 2.2  1.1 - 4.3 10*3/?L Final   . Monocytes, Absolute 12/01/2019 0.4  0.0 - 1.2 10*3/?L Final   . Eosinophils, Absolute 12/01/2019 0.4  0.0 - 0.7 10*3/?L Final   . Basophils, Absolute 12/01/2019 0.1  0.0 - 0.2 10*3/?L Final   . Differential Type 12/01/2019 Automated Differential   Final       RADIOLOGY STUDIES COMPLETED PRIOR TO THIS VISIT:  No results found.    Health Maintenance Due  Health Maintenance Due   Topic Date Due   . COVID-19 Vaccine (1 of 2) 08/03/1990       Past Medical History:   Diagnosis Date   . Abnormal ultrasound 05/13/2017   . hx of Graves' disease 2002    s/p I131  in 2002   . Postablative hypothyroidism     Hypothyroidism-Dr. Leonia Reader 2002   . Vitamin D deficiency      Past Surgical History:   Procedure Laterality Date   . ABDOMINAL SURGERY     .  APPENDECTOMY     . AUGMENTATION MAMMAPLASTY     . CERVICAL BIOPSY     . PANNICULECTOMY  2005    tummy tuck   . PROCEDURE Right 06/27/2017    Procedure: LAPAROSCOPY;  ADHESIOLYSIS; ARGON BEAM TREATMENT OF EXTENSIVE ENDOMETRIOSIS;  Surgeon: Rolene Arbour, MD;  Location[facility] OR;  Service: Gynecology;  Laterality: Right;   . PROCEDURE N/A 11/08/2017    Procedure: COLONOSCOPY with sedation;  Surgeon: Olin Pia, MD;  Location: [facility] ENDO;  Service: Endoscopy;  Laterality: N/A;   . PROCEDURE Right 09/18/2018    Procedure: LAPAROSCOPY; RIGHTSALPINGO-OOPHORECTOMY; APPENDECTOMY;  LYSIS OF PERIADENEXAL ADHESIONS;  Surgeon: Rolene Arbour, MD;  Location: [facility] OR;  Service: Gynecology;  Laterality: Right;   . TUBAL LIGATION      Tubal ligation 2008     Family History   Problem Relation Age of Onset   . Diabetes Mother    . Heart attack Mother         during a heart cath   . Lung cancer Mother         stage III   . Tobacco Dependence Mother    . Heart attack Father    . Tobacco Dependence Father    . No Known Health  Problems Son    . Diabetes type II Maternal Grandmother    . Hypertension Maternal Grandmother    . Diabetes type II Paternal Grandmother    . Hypertension Paternal Grandmother    . Coronary artery disease Paternal Grandfather    . Heart attack Paternal Grandfather    . No Known Health Problems Son    . Breast cancer Neg Hx    . Ovarian cancer Neg Hx       Social History     Socioeconomic History   . Marital status: Married     Spouse name: Jerrod   . Number of children: 2   . Years of education: Not on file   . Highest education level: Not on file   Tobacco Use   . Smoking status: Never Smoker   . Smokeless tobacco: Never Used   . Tobacco comment: passive smoke exposure   Substance and Sexual Activity   . Alcohol use: Yes     Alcohol/week: 0.0 standard drinks     Comment: occasional    . Drug use: No   . Sexual activity: Yes     Partners: Male     Birth control/protection: Surgical     Comment: Tubal Ligation   Other Topics Concern   . Caffeine Concern Yes     Comment: moderate   . Exercise Yes     Comment: occasional        I had a thoughtful and careful discussion regarding the issues today. All questions were answered. I educated and reassured. I encouraged followup as needed. Hand written instructions were given via the After Visit Summary including reference literature as appropriate. Today's medical decision maker  verbalize understanding of the treatment plan and are in agreement with the treatment plan goals. Medical decision maker is aware of how to contact clinical provider if concerns arise.      FOLLOW-UP:  Return in about 3 months (around 04/06/2020) for F/U on ADD/ADHD.       Time spent with patient in discussion and coordination of patient's care: 20 min    This note was transcribed using speech recognition software. Please contact us for clarification if any questions arise relating to the wording of  this document.

## 2020-01-05 NOTE — Patient Instructions (Signed)
Start on crestor 5 mg nightly for cholesterol.     Recommend that you start on N-acetylcysteine 600 mg twice a day to help lower your inflammatory markers and prevent free radicals (the chemicals in our bodies that increase risk of cancer). This can be purchased through [employer]. Recommend they NOW brand supplement.     You can find information about NAC using the following website:  [employer]Chronic[employer].si          Increase dose of levo to 200 mcg daily.     Therapeutic level of vitamin D is between 60-80.  Recommend starting on vitamin D3  IU 1 cap daily and on Monday, Wednesday and Friday take 2 caps daily. Recheck level in 2 months.        Recheck labs in 2 months.

## 2020-01-06 MED ORDER — levothyroxine (SYNTHROID) 200 MCG tablet
200 | ORAL_TABLET | Freq: Every day | ORAL | 2 refills | Status: DC
Start: 2020-01-06 — End: 2020-03-08
  Filled 2020-01-08: qty 30, 30d supply, fill #0

## 2020-01-06 MED ORDER — rosuvastatin (CRESTOR) 5 MG tablet
5 | ORAL_TABLET | Freq: Every day | ORAL | 2 refills | Status: DC
Start: 2020-01-06 — End: 2020-05-10
  Filled 2020-01-08: qty 30, 30d supply, fill #0

## 2020-01-06 MED ORDER — acetylcysteine (NAC) 600 mg Cap
600 | ORAL_CAPSULE | ORAL | 1 refills | Status: DC
Start: 2020-01-06 — End: 2021-11-01

## 2020-01-06 MED ORDER — levomefolate calcium (L-METHYLFOLATE) 10 mg Cap capsule
10 | ORAL_CAPSULE | ORAL | 0.00 refills | 16.50000 days | Status: AC
Start: 2020-01-06 — End: ?

## 2020-01-06 MED ORDER — dextroamphetamine-amphetamine ER (ADDERALL XR) 15 MG 24 hr capsule
15 | ORAL_CAPSULE | Freq: Every morning | ORAL | 0 refills | Status: DC
Start: 2020-01-06 — End: 2020-02-08
  Filled 2020-01-08: qty 28, 28d supply, fill #0

## 2020-01-06 MED ORDER — cholecalciferol, vitamin D3, 125 mcg (5,000 unit) capsule
125 | ORAL_CAPSULE | ORAL | 99 refills | 43.00000 days | Status: AC
Start: 2020-01-06 — End: ?

## 2020-02-08 MED ORDER — dextroamphetamine-amphetamine ER (ADDERALL XR) 15 MG 24 hr capsule
15 | ORAL_CAPSULE | Freq: Every morning | ORAL | 0 refills | Status: DC
Start: 2020-02-08 — End: 2020-03-15
  Filled 2020-02-12: qty 28, 28d supply, fill #0

## 2020-02-08 MED ORDER — norgestimate-ethinyl estradioL (MONO-LINYAH) 0.25-35 mg-mcg per tablet
0.25-0.035 | ORAL_TABLET | Freq: Every day | ORAL | 3 refills | 84.00000 days | Status: DC
Start: 2020-02-08 — End: 2020-06-20
  Filled 2020-02-12: qty 84, 84d supply, fill #0

## 2020-02-08 MED ORDER — liothyronine (CYTOMEL) 5 MCG tablet
5 | ORAL_TABLET | Freq: Every day | ORAL | 2 refills | 90.00000 days | Status: DC
Start: 2020-02-08 — End: 2020-06-16
  Filled 2020-02-12: qty 30, 30d supply, fill #0

## 2020-02-12 MED FILL — LEVOTHYROXINE 200 MCG TABLET: 200 ug | ORAL | 30 days supply | Qty: 30 | Fill #1

## 2020-02-12 MED FILL — ROSUVASTATIN 5 MG TABLET: 5 mg | ORAL | 30 days supply | Qty: 30 | Fill #1

## 2020-03-01 ENCOUNTER — Ambulatory Visit: Admit: 2020-03-01 | Payer: PRIVATE HEALTH INSURANCE | Primary: Family

## 2020-03-01 ENCOUNTER — Other Ambulatory Visit: Admit: 2020-03-01 | Discharge: 2020-03-01 | Payer: PRIVATE HEALTH INSURANCE | Primary: Family

## 2020-03-01 DIAGNOSIS — Z23 Encounter for immunization: Secondary | ICD-10-CM

## 2020-03-01 DIAGNOSIS — E782 Mixed hyperlipidemia: Secondary | ICD-10-CM

## 2020-03-01 LAB — COMPREHENSIVE METABOLIC PANEL
ALT - Alanine Aminotransferase: 9 IU/L (ref 7–52)
AST - Aspartate Aminotransferase: 8 IU/L (ref 8–39)
Albumin/Globulin Ratio: 1.3 (ref >0.9)
Albumin: 3.8 g/dL (ref 3.5–5.0)
Alkaline Phosphatase: 65 IU/L (ref 34–104)
Anion Gap: 8 mmol/L (ref 4–13)
BUN: 15 mg/dL (ref 6–23)
Bilirubin Total: 0.3 mg/dL (ref 0.3–1.2)
CO2 - Carbon Dioxide: 27 mmol/L (ref 21–31)
Calcium: 9 mg/dL (ref 8.6–10.3)
Chloride: 105 mmol/L (ref 98–111)
Creatinine: 0.73 mg/dL (ref 0.55–1.10)
GFR Estimated Female: >60 mL/min/{1.73_m2} (ref >=60)
Globulin: 3 g/dL (ref 2.2–3.7)
Glucose: 105 mg/dL — ABNORMAL HIGH (ref 80–99)
Potassium: 4 mmol/L (ref 3.5–5.1)
Protein Total: 6.8 g/dL (ref 6.0–8.0)
Sodium: 140 mmol/L (ref 135–143)

## 2020-03-01 LAB — CORONARY RISK LIPID PANEL REFLEX DIRECT LDL
Cholesterol, HDL: 51 mg/dL (ref >=40)
Cholesterol: 154 mg/dL (ref <200)
LDL Calculated: 77 mg/dL (ref <100)
Triglyceride: 131 mg/dL (ref 30–149)

## 2020-03-01 LAB — TSH: TSH - Thyroid Stimulating Hormone: 0.06 ??IU/mL — ABNORMAL LOW (ref 0.45–5.33)

## 2020-03-01 LAB — T4, FREE: T4, Free: 1.3 ng/dL — ABNORMAL HIGH (ref 0.6–1.2)

## 2020-03-01 LAB — T3, FREE: T3, Free: 3.7 pg/mL (ref 2.5–3.9)

## 2020-03-01 NOTE — Telephone Encounter (Signed)
Patient took both Levothyroxine 200 mcg and Liothyronine 5 mcg on the day of getting lab work done.  She has an appointment on 03/08/2020.    Future Appointments   Date Time Provider Department Center   03/01/2020  4:45 PM VACCINE DOSE 1 [place] [id number]   03/08/2020  3:30 PM [name], FNP [id number]

## 2020-03-01 NOTE — Telephone Encounter (Signed)
-----   Message from [name], FNP sent at 03/01/2020  1:31 PM PDT -----  Did she take her levo dose the morning of her lab draw? And this level today was on the 5 mcg of liothyronine?

## 2020-03-02 ENCOUNTER — Encounter: Payer: PRIVATE HEALTH INSURANCE | Attending: Family | Primary: Family

## 2020-03-02 NOTE — Telephone Encounter (Signed)
Patient reports that she definitely feels like her thyroid is off.  She will have lab work repeated and not take her thyroid medication the day of getting lab work drawn.  She expressed understanding.

## 2020-03-02 NOTE — Telephone Encounter (Signed)
Per PCP:  That's why her lab is off. She needs to repeat them. Was not supposed to take thyroid meds the morning of her lab draw! SMH.

## 2020-03-08 ENCOUNTER — Ambulatory Visit: Admit: 2020-03-08 | Discharge: 2020-03-17 | Payer: PRIVATE HEALTH INSURANCE | Attending: Family | Primary: Family

## 2020-03-08 ENCOUNTER — Other Ambulatory Visit: Admit: 2020-03-08 | Discharge: 2020-03-08 | Payer: PRIVATE HEALTH INSURANCE | Primary: Family

## 2020-03-08 DIAGNOSIS — E039 Hypothyroidism, unspecified: Secondary | ICD-10-CM

## 2020-03-08 LAB — TSH: TSH - Thyroid Stimulating Hormone: 0.05 ??IU/mL — ABNORMAL LOW (ref 0.45–5.33)

## 2020-03-08 LAB — T3, FREE: T3, Free: 3.5 pg/mL (ref 2.5–3.9)

## 2020-03-08 LAB — T4, FREE: T4, Free: 1.2 ng/dL (ref 0.6–1.2)

## 2020-03-08 MED ORDER — levothyroxine (SYNTHROID) 175 MCG tablet
175 | ORAL_TABLET | Freq: Every day | ORAL | 1 refills | Status: DC
Start: 2020-03-08 — End: 2020-05-13
  Filled 2020-03-10: qty 90, 90d supply, fill #0

## 2020-03-08 NOTE — Other (Signed)
Discussed results with patient at time of visit

## 2020-03-08 NOTE — Progress Notes (Addendum)
Kerry Allen is a 44 y.o. female seen today for ADD  .      Patient is alone. The Patient is the medical decision(s) maker attending today's appointment.    ASSESSMENT AND PLAN:  Problem List Items Addressed This Visit        Active Problems    Attention deficit disorder (ADD) in adult    Hypothyroidism - Primary (Chronic)    Relevant Medications    levothyroxine (SYNTHROID) 175 MCG tablet          Plan:     Increase dose of adderall xr to 20 mg daily.     Decrease dose of levo to 175 mcg daily.     Push fluids.     Take extra b complex at lunch/early afternoon.     Recheck thyroid labs in 6 weeks.         Support for the above plan obtained during this visit:    HPI/Interval history  Psych Concerns:  not any better, but not worse. Has not really changed the concentration. States that she does well in the mornings but seems to get worse as the day progresses. Pt is sleeping ok.     Review of Measure results below are as follows:    Adult ADHD Self-Report Scale (ASRS-V1.1) Symptom Checklist 03/08/2020 11/26/2019 05/05/2019   How often do you have trouble wrapping up the final details of a project, once the challenging parts have been done? Rarely Rarely Rarely   How often do you have difficulty getting things in order when you have to do a task that requries organization? Sometimes Often Rarely   How often do you have problems remembering appointments or obligations? Often Often Sometimes   When you have a task that requires a lot of thought, how often do you avoid or delay getting started? Sometimes Often Sometimes   How often do you fidget or squirm with your hands or feet when you have to sit down for a long time? Never Often Rarely   How often do you feel overly active and compelled to do things, like you were driven by a motor? Never Sometimes Rarely   How often do you make careless mistakes when you have to work on a boring or difficult project? Sometimes Sometimes Sometimes   How often do you have  difficulty keeping your attention when you are doing boring or repetitive work? Sometimes Sometimes Sometimes   How often do you have difficulty concentrating on what people say to you, even when they are speaking to you directly? Rarely Often Sometimes   How often do you misplace or have difficulty finding things at home or at work? Often Very Often Often   How often are you distracted by activity or noise around you? Sometimes Sometimes Sometimes   How often do you leave your seat in meetings or other situations in which you are expected to remain seated? Never Sometimes Never   How often do you feel restless or fidgety? Rarely Often Rarely   How often do you have difficulty unwinding and relaxing when you have time to yourself? Never Often Rarely   How often do you find yourself talking too much when you are in social situations? Sometimes Sometimes Sometimes   When you're in a conversation, how often do you find yourself finishing the sentences of the people you are talking to, before they can finish them themselves? Sometimes Sometimes Sometimes   How often do you have difficulty waiting your  turn in situations when turn taking is required? Sometimes Sometimes Rarely   How often do you interrupt others when they are busy? Sometimes Sometimes Sometimes     Hyperlipidemia  Noticeable improvement in her lipid panel with the addition of the crestor. Denies any ADR symptoms of current dose.     ALLERGIES  Allergies   Allergen Reactions   . Penicillins Rash     When she was a child       Outpatient Medications Marked as Taking for the 03/08/20 encounter (Office Visit) with [name], FNP   Medication Sig Dispense Refill   . acetylcysteine (NAC) 600 mg Cap Take 1 capsule twice a day 180 capsule 1   . b complex vitamins capsule One Elevated Methylfolate+ (B-Complex) 1 cap every morning     . cholecalciferol, vitamin D3, 125 mcg (5,000 unit) capsule Take 5,000 IU daily and Monday, Wednesday and Friday take 21308  IU. 100 capsule prn   . hydroCHLOROthiazide (HYDRODIURIL) 25 MG tablet Take 1 tablet by mouth daily for hypertension. 30 tablet 2   . INOSITOL, BULK, MISC by Miscellaneous route.     Marland Kitchen levomefolate calcium (L-METHYLFOLATE) 10 mg Cap capsule Take 10 mg daily 90 capsule    . liothyronine (CYTOMEL) 5 MCG tablet Take 1 tablet by mouth daily. 30 tablet 2   . norgestimate-ethinyl estradioL (MONO-LINYAH) 0.25-35 mg-mcg per tablet Take 1 tablet by mouth daily for 3 weeks then start the next pill pack, skip placebo tablets. 84 tablet 3   . rosuvastatin (CRESTOR) 5 MG tablet Take 1 tablet by mouth daily. 30 tablet 2   . [DISCONTINUED] dextroamphetamine-amphetamine ER (ADDERALL XR) 15 MG 24 hr capsule Take 1 capsule by mouth every morning for 28 days. 28 capsule 0   . [DISCONTINUED] levothyroxine (SYNTHROID) 200 MCG tablet Take 1 tablet by mouth daily. 30 tablet 2       VITALS:  Vitals:    03/08/20 1539   BP: 130/72   Pulse: 84   Resp: 18   Temp: 36.8 ?C (98.3 ?F)   TempSrc: Oral   SpO2: 100%   Weight: 178 lb (80.7 kg)   Height: 5' 2 (1.575 m)     Body mass index is 32.56 kg/m?Marland Kitchen  No LMP recorded. Patient has had an ablation.    Ideal body weight: 110 lb 7.2 oz (50.1 kg)  Adjusted ideal body weight: 137 lb 7.5 oz (62.4 kg)     Wt Readings from Last 3 Encounters:   03/08/20 178 lb (80.7 kg)   01/05/20 175 lb (79.4 kg)   07/07/19 169 lb (76.7 kg)       Review of Systems   Constitutional: Positive for fatigue. Negative for chills and fever.   HENT: Negative for congestion and sore throat.    Respiratory: Negative for cough.    Neurological: Negative for dizziness and headaches.   All other systems reviewed and are negative.       Physical Exam   Cardiovascular: Normal rate and regular rhythm.   Pulmonary/Chest: Effort normal and breath sounds normal.   Psychiatric:   APPEARANCE: Well developed, well nourished, adequately groomed, appropriate dress for stated age and present situation    BEHAVIOR: Good eye contact, Calm and  cooperative  ORIENTATION: person, place and time/date  MEMORY:  Recent and remote grossly intact.  CONCIOUSNESS: awake and responds appropriately  SPEECH: Normal rate, tone and volume.  MOTOR: normal psychomotor activity, No evidence of tremors or dystonia. No psychomotor agitation or  retardation. Steady gait, normal station.  MOOD:  Happy, pleasant   AFFECT: Congruent, full range and appropriate.  THOUGHT PROCESS/ASSOCIATIONS: Clarity is coherent, Linear thoughts are intact.   THOUGHT CONTENT: appropriate to present situation; Denies any suicidal or homicidal ideations. Denies any auditory or visual hallucinations. No overt evidence of any delusions or paranoia.  INSIGHT: fair  JUDGMENT: fair  IMPULSE CONTROL: fair  ATTENTION/CONCENTRATION: Sufficient/Intact and appropriate.  FUND OF KNOWLEDGE:  Normal/average  LEVEL OF SELF ESTEEM:  Normal      Items reviewed in preparation for this visit:  The following portions of the patient's history were reviewed and updated as appropriate: allergies, current medications, past family history, past medical history, past surgical history and problem list.  When necessary and appropriate, prior medical records, medical history from family members or other providers were reviewed for coordination of care.     LABWORK COMPLETED PRIOR TO THIS VISIT:  Lab Walk-In on 03/08/2020   Component Date Value Ref Range Status   . TSH - Thyroid Stimulating Hormone 03/08/2020 0.05* 0.45 - 5.33 ?IU/mL Final   . T4, Free 03/08/2020 1.2  0.6 - 1.2 ng/dL Final   . T3, Free 16/08/9603 3.5  2.5 - 3.9 pg/mL Final   Lab Walk-In on 03/01/2020   Component Date Value Ref Range Status   . Sodium 03/01/2020 140  135 - 143 mmol/L Final   . Potassium 03/01/2020 4.0  3.5 - 5.1 mmol/L Final   . Chloride 03/01/2020 105  98 - 111 mmol/L Final   . CO2 - Carbon Dioxide 03/01/2020 27  21 - 31 mmol/L Final   . Glucose 03/01/2020 105* 80 - 99 mg/dL Final   . BUN 54/07/8118 15  6 - 23 mg/dL Final   . Creatinine  14/78/2956 0.73  0.55 - 1.10 mg/dL Final   . Calcium 21/30/8657 9.0  8.6 - 10.3 mg/dL Final   . AST - Aspartate Aminotransferase 03/01/2020 8  8 - 39 IU/L Final   . ALT - Alanine Aminotransferase 03/01/2020 9  7 - 52 IU/L Final   . Alkaline Phosphatase 03/01/2020 65  34 - 104 IU/L Final   . Bilirubin Total 03/01/2020 0.3  0.3 - 1.2 mg/dL Final   . Protein Total 03/01/2020 6.8  6.0 - 8.0 g/dL Final   . Albumin 84/69/6295 3.8  3.5 - 5.0 g/dL Final   . Globulin 28/41/3244 3.0  2.2 - 3.7 g/dL Final   . Albumin/Globulin Ratio 03/01/2020 1.3  >0.9 Final   . Anion Gap 03/01/2020 8  4 - 13 mmol/L Final   . GFR Estimated Female 03/01/2020 >60  >=60 mL/min/1.23m*2 Final   . GFR Additional Info 03/01/2020    Final   . Cholesterol 03/01/2020 154  <200 mg/dL Final   . Cholesterol, HDL 03/01/2020 51  >=40 mg/dL Final   . Triglyceride 03/01/2020 131  30 - 149 mg/dL Final   . LDL Calculated 03/01/2020 77  <100 mg/dL Final   . T4, Free 11/07/7251 1.3* 0.6 - 1.2 ng/dL Final   . TSH - Thyroid Stimulating Hormone 03/01/2020 0.06* 0.45 - 5.33 ?IU/mL Final   . T3, Free 03/01/2020 3.7  2.5 - 3.9 pg/mL Final       RADIOLOGY STUDIES COMPLETED PRIOR TO THIS VISIT:  No results found.    Health Maintenance Due  There are no preventive care reminders to display for this patient.    Past Medical History:   Diagnosis Date   . Abnormal ultrasound 05/13/2017   .  hx of Graves' disease 2002    s/p I131  in 2002   . Postablative hypothyroidism     Hypothyroidism-Dr. Leonia Reader 2002   . Vitamin D deficiency      Past Surgical History:   Procedure Laterality Date   . ABDOMINAL SURGERY     . APPENDECTOMY     . AUGMENTATION MAMMAPLASTY     . CERVICAL BIOPSY     . PANNICULECTOMY  2005    tummy tuck   . PROCEDURE Right 06/27/2017    Procedure: LAPAROSCOPY;  ADHESIOLYSIS; ARGON BEAM TREATMENT OF EXTENSIVE ENDOMETRIOSIS;  Surgeon: Rolene Arbour, MD;  Location: [facility] OR;  Service: Gynecology;  Laterality: Right;   . PROCEDURE N/A 11/08/2017    Procedure: COLONOSCOPY  with sedation;  Surgeon: Olin Pia, MD;  Location: [facility] ENDO;  Service: Endoscopy;  Laterality: N/A;   . PROCEDURE Right 09/18/2018    Procedure: LAPAROSCOPY; RIGHTSALPINGO-OOPHORECTOMY; APPENDECTOMY;  LYSIS OF PERIADENEXAL ADHESIONS;  Surgeon: Rolene Arbour, MD;  Location: [facility] OR;  Service: Gynecology;  Laterality: Right;   . TUBAL LIGATION      Tubal ligation 2008     Family History   Problem Relation Age of Onset   . Diabetes Mother    . Heart attack Mother         during a heart cath   . Lung cancer Mother         stage III   . Tobacco Dependence Mother    . Heart attack Father    . Tobacco Dependence Father    . No Known Health Problems Son    . Diabetes type II Maternal Grandmother    . Hypertension Maternal Grandmother    . Diabetes type II Paternal Grandmother    . Hypertension Paternal Grandmother    . Coronary artery disease Paternal Grandfather    . Heart attack Paternal Grandfather    . No Known Health Problems Son    . Breast cancer Neg Hx    . Ovarian cancer Neg Hx       Social History     Socioeconomic History   . Marital status: Married     Spouse name: Jerrod   . Number of children: 2   . Years of education: Not on file   . Highest education level: Not on file   Tobacco Use   . Smoking status: Never Smoker   . Smokeless tobacco: Never Used   . Tobacco comment: passive smoke exposure   Substance and Sexual Activity   . Alcohol use: Yes     Alcohol/week: 0.0 standard drinks     Comment: occasional    . Drug use: No   . Sexual activity: Yes     Partners: Male     Birth control/protection: Surgical     Comment: Tubal Ligation   Other Topics Concern   . Caffeine Concern Yes     Comment: moderate   . Exercise Yes     Comment: occasional        I had a thoughtful and careful discussion regarding where more than 50% of 25 min visit was spent with the patient/medical decision maker addressing above concerns. The Patient's  questions were answered. I educated, reassured and encouraged followup as  needed. Hand written instructions were given via the After Visit Summary including reference literature as appropriate. Today's medical decision maker did verbalize understanding of the treatment plan and were in agreement with the treatment plan goals. Medical decision maker is aware  of how to contact clinical provider if concerns arise.    FOLLOW-UP:  Return in about 6 weeks (around 04/19/2020) for F/U on ADD/ADHD.   Future Appointments   Date Time Provider Department Center   03/23/2020  8:05 AM VACCINE PFIZER DOSE [facility] [place] [id number]   04/19/2020  2:30 PM [name], FNP [id number]       This note was transcribed using speech recognition software. Please contact us for clarification if any questions arise relating to the wording of this document.

## 2020-03-08 NOTE — Patient Instructions (Addendum)
Look up vitamin C for melasma. SUNSCREEN.    Increase dose of adderall xr to 20 mg daily.     Decrease dose of levo to 175 mcg daily.     Push fluids.     Take extra b complex at lunch/early afternoon.

## 2020-03-15 MED ORDER — dextroamphetamine-amphetamine ER (ADDERALL XR) 20 MG 24 hr capsule
20 | ORAL_CAPSULE | Freq: Every morning | ORAL | 0 refills | 30.00000 days | Status: DC
Start: 2020-03-15 — End: 2020-04-22
  Filled 2020-03-17: qty 28, 28d supply, fill #0

## 2020-03-15 NOTE — Telephone Encounter (Signed)
From: [name]  To: [name], FNP  Sent: 03/15/2020 4:49 AM PDT  Subject: Medication Question    Hi,     I thought rebecca was going to up my dextroamphetamine-amphetamine ER 15 MG 24 hr capsule to 20mg  but there was no new Rx for it. Im just double checking since i have to refill my old one in the next few days.     Thank you,    Kerry Allen

## 2020-03-15 NOTE — Telephone Encounter (Signed)
Notes state to Increase dose of adderall xr to 20 mg daily.   Do you want a new prescription sent to her pharmacy.

## 2020-03-22 ENCOUNTER — Encounter: Primary: Family

## 2020-03-23 ENCOUNTER — Ambulatory Visit: Admit: 2020-03-23 | Payer: PRIVATE HEALTH INSURANCE | Primary: Family

## 2020-03-23 DIAGNOSIS — Z23 Encounter for immunization: Secondary | ICD-10-CM

## 2020-03-28 MED FILL — ROSUVASTATIN 5 MG TABLET: 5 mg | ORAL | 30 days supply | Qty: 30 | Fill #2

## 2020-03-28 MED FILL — LIOTHYRONINE 5 MCG TABLET: 5 ug | ORAL | 12 days supply | Qty: 12 | Fill #2

## 2020-03-28 MED FILL — LIOTHYRONINE 5 MCG TABLET: 5 ug | ORAL | 18 days supply | Qty: 18 | Fill #1

## 2020-04-06 ENCOUNTER — Encounter: Payer: PRIVATE HEALTH INSURANCE | Attending: Family | Primary: Family

## 2020-04-14 ENCOUNTER — Encounter: Admit: 2020-04-14 | Payer: PRIVATE HEALTH INSURANCE | Primary: Family

## 2020-04-14 NOTE — Progress Notes (Signed)
Personalized Wellness Plan Assessment     Strengths: has a good support system is very eager to take back her health her why is very strong and she is just ready to find a good balance of food that he;ps her with her goals   Limitations:has hyperthyroidism may need to find a consistent balance of food to help through her medica issue    Medical conditions:hyperthyroidism and family health history of heart disease and death at early ages   Personal Goals:break the cycle of early unhealthy death that is in her family get healthy and find balance in her life     Action Steps & Resources     Going to go with coaching for now will set up a session 0 for measurements and dive deeper into goals    Referred patient to: None coaching    Does patient need to be referred to a PCP? No, patient already has PCP

## 2020-04-19 ENCOUNTER — Ambulatory Visit: Admit: 2020-04-19 | Discharge: 2020-05-17 | Payer: PRIVATE HEALTH INSURANCE | Attending: Family | Primary: Family

## 2020-04-19 DIAGNOSIS — F988 Other specified behavioral and emotional disorders with onset usually occurring in childhood and adolescence: Secondary | ICD-10-CM

## 2020-04-19 NOTE — Progress Notes (Signed)
Kerry Allen is a 44 y.o. female seen today for ADHD  .      Patient is alone. The Patient is the medical decision(s) maker attending today's appointment.    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     Be cautious with caffeine. If pulse is staying above 80 sitting still, please let me know.          Support for the above plan obtained during this visit:    HPI/Interval history  Psych Concerns:  States that she has been sleeping well. States that the brain fog in the afternoon improved after she moved the B-complex. States that the hormones have also been helpful. States that she had her initial appt with the wellness coach at [place] and is looking forward to working with her on her goals.     Noted interval weight loss of 6 lbs.     Adult ADHD Self-Report Scale (ASRS-V1.1) Symptom Checklist 04/19/2020 03/08/2020 11/26/2019   How often do you have trouble wrapping up the final details of a project, once the challenging parts have been done? Rarely Rarely Rarely   How often do you have difficulty getting things in order when you have to do a task that requries organization? Rarely Sometimes Often   How often do you have problems remembering appointments or obligations? Often Often Often   When you have a task that requires a lot of thought, how often do you avoid or delay getting started? Sometimes Sometimes Often   How often do you fidget or squirm with your hands or feet when you have to sit down for a long time? Sometimes Never Often   How often do you feel overly active and compelled to do things, like you were driven by a motor? Rarely Never Sometimes   How often do you make careless mistakes when you have to work on a boring or difficult project? Sometimes Sometimes Sometimes   How often do you have difficulty keeping your attention when you are doing  boring or repetitive work? Often Sometimes Sometimes   How often do you have difficulty concentrating on what people say to you, even when they are speaking to you directly? Sometimes Rarely Often   How often do you misplace or have difficulty finding things at home or at work? Very Often Often Very Often   How often are you distracted by activity or noise around you? Sometimes Sometimes Sometimes   How often do you leave your seat in meetings or other situations in which you are expected to remain seated? Never Never Sometimes   How often do you feel restless or fidgety? Rarely Rarely Often   How often do you have difficulty unwinding and relaxing when you have time to yourself? Rarely Never Often   How often do you find yourself talking too much when you are in social situations? Sometimes Sometimes Sometimes   When you're in a conversation, how often do you find yourself finishing the sentences of the people you are talking to, before they can finish them themselves? Often Sometimes Sometimes   How often do you have difficulty waiting your turn in situations when turn taking is required? Sometimes Sometimes Sometimes   How often do you interrupt others when  they are busy? Sometimes Sometimes Sometimes         ALLERGIES  Allergies   Allergen Reactions   . Penicillins Rash     When she was a child       Outpatient Medications Marked as Taking for the 04/19/20 encounter (Office Visit) with [name], FNP   Medication Sig Dispense Refill   . acetylcysteine (NAC) 600 mg Cap Take 1 capsule twice a day 180 capsule 1   . b complex vitamins capsule One Elevated Methylfolate+ (B-Complex) 1 cap every morning     . cholecalciferol, vitamin D3, 125 mcg (5,000 unit) capsule Take 5,000 IU daily and Monday, Wednesday and Friday take 29528 IU. 100 capsule prn   . hydroCHLOROthiazide (HYDRODIURIL) 25 MG tablet Take 1 tablet by mouth daily for hypertension. 30 tablet 2   . INOSITOL, BULK, MISC by Miscellaneous route.     Marland Kitchen  levomefolate calcium (L-METHYLFOLATE) 10 mg Cap capsule Take 10 mg daily 90 capsule    . liothyronine (CYTOMEL) 5 MCG tablet Take 1 tablet by mouth daily. 30 tablet 2   . norgestimate-ethinyl estradioL (MONO-LINYAH) 0.25-35 mg-mcg per tablet Take 1 tablet by mouth daily for 3 weeks then start the next pill pack, skip placebo tablets. 84 tablet 3   . [DISCONTINUED] levothyroxine (SYNTHROID) 175 MCG tablet Take 1 tablet by mouth daily. 90 tablet 1   . [DISCONTINUED] rosuvastatin (CRESTOR) 5 MG tablet Take 1 tablet by mouth daily. 30 tablet 2       VITALS:  Vitals:    04/19/20 1441   BP: 130/86   Pulse: 94   Temp: 36.8 ?C (98.3 ?F)   TempSrc: Oral   SpO2: 100%   Weight: 172 lb (78 kg)   Height: 5' 2 (1.575 m)     Body mass index is 31.46 kg/m?Marland Kitchen  No LMP recorded. Patient has had an ablation.    Ideal body weight: 110 lb 7.2 oz (50.1 kg)  Adjusted ideal body weight: 135 lb 1.1 oz (61.3 kg)     Wt Readings from Last 3 Encounters:   04/28/20 165 lb 12.8 oz (75.2 kg)   04/21/20 171 lb 4.8 oz (77.7 kg)   04/19/20 172 lb (78 kg)       Review of Systems   Constitutional: Negative for chills, fatigue and fever.   HENT: Negative for congestion, ear discharge, ear pain, postnasal drip, rhinorrhea, sinus pressure, sinus pain, sneezing and sore throat.    Eyes: Negative for pain and itching.   Respiratory: Negative for cough, chest tightness, shortness of breath and wheezing.    Cardiovascular: Negative for chest pain, palpitations and leg swelling.   Gastrointestinal: Negative for abdominal pain, diarrhea, nausea and vomiting.   Neurological: Negative for dizziness and headaches.   Psychiatric/Behavioral: Positive for decreased concentration. Negative for sleep disturbance. The patient is not nervous/anxious.         See HPI        Physical Exam  Cardiovascular:      Rate and Rhythm: Normal rate and regular rhythm.   Pulmonary:      Effort: Pulmonary effort is normal.      Breath sounds: Normal breath sounds.   Psychiatric:       Comments: APPEARANCE: Well developed, well nourished, adequately groomed, appropriate dress for stated age and present situation    BEHAVIOR: Good eye contact, Calm and cooperative  ORIENTATION: person, place and time/date  MEMORY:  Recent and remote grossly intact.  CONCIOUSNESS: awake and  responds appropriately  SPEECH: Normal rate, tone and volume.  MOTOR: normal psychomotor activity, No evidence of tremors or dystonia. No psychomotor agitation or retardation. Steady gait, normal station.  MOOD:  Happy, pleasant   AFFECT: Congruent, full range and appropriate.  THOUGHT PROCESS/ASSOCIATIONS: Clarity is coherent, Linear thoughts are intact.   THOUGHT CONTENT: appropriate to present situation; Denies any suicidal or homicidal ideations. Denies any auditory or visual hallucinations. No overt evidence of any delusions or paranoia.  INSIGHT: fair  JUDGMENT: fair  IMPULSE CONTROL: fair  ATTENTION/CONCENTRATION: Sufficient/Intact and appropriate.  FUND OF KNOWLEDGE:  Normal/average  LEVEL OF SELF ESTEEM:  Normal        Items reviewed in preparation for this visit:  The following portions of the patient's history were reviewed and updated as appropriate: allergies, current medications, past family history, past medical history, past surgical history and problem list.  When necessary and appropriate, prior medical records, medical history from family members or other providers were reviewed for coordination of care.     LABWORK COMPLETED PRIOR TO THIS VISIT:  Lab Walk-In on 03/08/2020   Component Date Value Ref Range Status   . TSH - Thyroid Stimulating Hormone 03/08/2020 0.05* 0.45 - 5.33 ?IU/mL Final   . T4, Free 03/08/2020 1.2  0.6 - 1.2 ng/dL Final   . T3, Free 16/08/9603 3.5  2.5 - 3.9 pg/mL Final       RADIOLOGY STUDIES COMPLETED PRIOR TO THIS VISIT:  No results found.    Health Maintenance Due  There are no preventive care reminders to display for this patient.    Past Medical History:   Diagnosis Date   . Abnormal  ultrasound 05/13/2017   . hx of Graves' disease 2002    s/p I131  in 2002   . Postablative hypothyroidism     Hypothyroidism-Dr. Leonia Reader 2002   . Vitamin D deficiency      Past Surgical History:   Procedure Laterality Date   . ABDOMINAL SURGERY     . APPENDECTOMY     . AUGMENTATION MAMMAPLASTY     . CERVICAL BIOPSY     . PANNICULECTOMY  2005    tummy tuck   . PROCEDURE Right 06/27/2017    Procedure: LAPAROSCOPY;  ADHESIOLYSIS; ARGON BEAM TREATMENT OF EXTENSIVE ENDOMETRIOSIS;  Surgeon: Rolene Arbour, MD;  Location: [facility] OR;  Service: Gynecology;  Laterality: Right;   . PROCEDURE N/A 11/08/2017    Procedure: COLONOSCOPY with sedation;  Surgeon: Olin Pia, MD;  Location: [facility] ENDO;  Service: Endoscopy;  Laterality: N/A;   . PROCEDURE Right 09/18/2018    Procedure: LAPAROSCOPY; RIGHTSALPINGO-OOPHORECTOMY; APPENDECTOMY;  LYSIS OF PERIADENEXAL ADHESIONS;  Surgeon: Rolene Arbour, MD;  Location: [facility] OR;  Service: Gynecology;  Laterality: Right;   . TUBAL LIGATION      Tubal ligation 2008     Family History   Problem Relation Age of Onset   . Diabetes Mother    . Heart attack Mother         during a heart cath   . Lung cancer Mother         stage III   . Tobacco Dependence Mother    . Heart attack Father    . Tobacco Dependence Father    . No Known Health Problems Son    . Diabetes type II Maternal Grandmother    . Hypertension Maternal Grandmother    . Diabetes type II Paternal Grandmother    . Hypertension Paternal Grandmother    .  Coronary artery disease Paternal Grandfather    . Heart attack Paternal Grandfather    . No Known Health Problems Son    . Breast cancer Neg Hx    . Ovarian cancer Neg Hx       Social History     Socioeconomic History   . Marital status: Married     Spouse name: Jerrod   . Number of children: 2   . Years of education: Not on file   . Highest education level: Not on file   Tobacco Use   . Smoking status: Never Smoker   . Smokeless tobacco: Never Used   . Tobacco comment: passive smoke exposure    Substance and Sexual Activity   . Alcohol use: Yes     Alcohol/week: 0.0 standard drinks     Comment: occasional    . Drug use: No   . Sexual activity: Yes     Partners: Male     Birth control/protection: Surgical     Comment: Tubal Ligation   Other Topics Concern   . Caffeine Concern Yes     Comment: moderate   . Exercise Yes     Comment: occasional        I had a thoughtful and careful discussion regarding where more than 50% of 25 min visit was spent with the patient/medical decision maker addressing above concerns. The Patient's  questions were answered. I educated, reassured and encouraged followup as needed. Hand written instructions were given via the After Visit Summary including reference literature as appropriate. Today's medical decision maker did verbalize understanding of the treatment plan and were in agreement with the treatment plan goals. Medical decision maker is aware of how to contact clinical provider if concerns arise.    FOLLOW-UP:  Return in about 3 months (around 07/20/2020) for F/U on ADD/ADHD.   Future Appointments   Date Time Provider Department Center   05/26/2020 11:20 AM [facility] APP Clinics   06/15/2020  3:00 PM [facility] MAMMO ROOM 1 RR [facility]       This note was transcribed using speech recognition software. Please contact us for clarification if any questions arise relating to the wording of this document.

## 2020-04-19 NOTE — Patient Instructions (Addendum)
Continue current medications/supplements.     Be cautious with caffeine. If pulse is staying above 80 sitting still, please let me know.

## 2020-04-19 NOTE — Progress Notes (Deleted)
States that she has been sleeping well. States that the brain fog in the afternoon improved after she moved the B-complex. States that the hormones have also been helpful. States that she had her initial appt with the wellness coach at [place] and is looking forward to working with her on her goals.     Noted interval weight loss of 6 lbs.

## 2020-04-20 ENCOUNTER — Encounter: Payer: PRIVATE HEALTH INSURANCE | Attending: Family | Primary: Family

## 2020-04-21 ENCOUNTER — Encounter: Admit: 2020-04-21 | Payer: PRIVATE HEALTH INSURANCE | Primary: Family

## 2020-04-21 DIAGNOSIS — Z008 Encounter for other general examination: Secondary | ICD-10-CM

## 2020-04-21 LAB — POC HEALTH PROMOTION BIOMETRIC SCREEN
HDL: 36 mg/dL — AB (ref 60–?)
Non-HDL: 118 mg/dL (ref ?–130)
Non-fasting Glucose: 94 mg/dL (ref ?–139)
TC/HDL Ratio: 4.3 (ref ?–5.0)
Total Cholesterol: 154 mg/dL (ref ?–200)

## 2020-04-21 NOTE — Progress Notes (Signed)
Health Coaching Session #0     04/21/20        Wins/successes: HER MINDSET IS VERY READY TO MAKE CHANGES, SHE IS TRACKING HER FOOD AND ALREADY LEARNING MFPAL    Challenges/barriers:GETTING OUT OF HER HEAD AND GET MOVING AND LIFE STRESSES   Discussion notes: WE DID STARTING MEASUREMENTS BIOMETRIC SCREENING AND A1C TODAY TALKED ABOUT HER GOALS AND WHY AND SET SMALL GOALS TO START HER GOING IN THE RIGHT DIRECTION SHE WILL CONTINUE TRACKING FOOD SO WE CAN COME UP WITH STARTING MACROS, SHE IS GOING TO MOVE NO LESS THAN 3X NEXT WEEK 30 MIN MINIMUM IN LENGTH AND WILL MAKE THE CONNECTION WITH [employer] TO START WORKING ON STEPS TO WORK THROUGH HER LIFE STRESSORS      Action Steps   WILL COME UP WITH NEW MACROS AND DISCUSS HOW GOALS WENT NEXT WEEK NO VIDEOS THIS WEEK WE SET SOME LENGTHY HW GOALS WE WILL START THERE       Patient engaged with: Employee Assistance Program and [employer] AND COACHING     Does patient need to be referred to a PCP? No, patient already has PCP

## 2020-04-22 MED ORDER — omega-3 fatty acids 1,000 mg Cap
1000 | ORAL | 1 refills | 90.00000 days | Status: AC
Start: 2020-04-22 — End: ?

## 2020-04-25 MED ORDER — dextroamphetamine-amphetamine ER (ADDERALL XR) 20 MG 24 hr capsule
20 | ORAL_CAPSULE | Freq: Every morning | ORAL | 0 refills | Status: DC
Start: 2020-04-25 — End: 2020-06-11
  Filled 2020-04-28: qty 28, 28d supply, fill #0

## 2020-04-25 NOTE — Telephone Encounter (Signed)
Patient is requesting a refill of Adderall. Last office visit was 6/15.

## 2020-04-28 ENCOUNTER — Encounter: Admit: 2020-04-28 | Payer: PRIVATE HEALTH INSURANCE | Primary: Family

## 2020-04-28 NOTE — Progress Notes (Signed)
Health Coaching Session #1     04/28/20  Weight: 165 lb 12.8 oz (75.2 kg)     Wins/successes:TRACKING FOOD MOST DAYS AND BALANCING OUT MACROS, WAS ABLE TO GET OUT AND WALK BEFORE WORK 2X AND AN EXTRA WALK WHILE HER SPOUSE WAS RECOVERING FROM SURGERY SHE FEELS BETTER AND BALANCED AND THE SCALE ALSO WENT DOWN THIS WEEK    Challenges/barriers:STRES FROM SPOUSES SURGERY AND HIS RECOVERY ALONG WITH OTHER LIFE STRESSORS THAT SHE IS TRYING TO WORK THROUGH   Discussion notes: CONTINUE LOADING FOOD IN ADVANCE MOST DAYS SHE PLANS TO WALK 3-5X NEXT WEEK BEFORE WORK STARTS AND PLAN B EVEN IF IT IS HOT OUTSIDE SHE WILL WALK AFTER WORK FOR A MIN OF AT A TIME SHE IS GOING TO REACH OUT AND SCHEDULE A CALL WITH EAP WHICH SHE HASNT YET AND KNOWS IT WILL BE BENEFICIAL      Action Steps     CALLING EAP TO GET COUNSELING GOING TO WORK THROUGH LIFE STRESS  WALKING 3-5X BEFORE WORK AT LEAST 15 MIN AT A TIME    PRE LOADING FOOD MOST DAYS BEFORE SHE EATS VS AFTER EATING     Patient engaged with: Pension scheme manager and [employer]  ACTIVE WITH COACHING SESSIONS  Does patient need to be referred to a PCP? No, patient already has PCP

## 2020-05-02 NOTE — Telephone Encounter (Signed)
Called patient, received her VM. LMTCB  Labs ordered

## 2020-05-02 NOTE — Telephone Encounter (Signed)
-----   Message from Allyne Gee, [place] sent at 03/21/2020  7:59 AM PDT -----  Recheck TSH, free T4, Free t3 in 6 weeks.

## 2020-05-03 NOTE — Telephone Encounter (Signed)
Called patient, received her VM. LMTCB

## 2020-05-04 NOTE — Telephone Encounter (Signed)
Spoke w/pt. She is aware she is due for her labs and will get those done asap.

## 2020-05-10 ENCOUNTER — Other Ambulatory Visit: Admit: 2020-05-10 | Discharge: 2020-05-10 | Payer: PRIVATE HEALTH INSURANCE | Primary: Family

## 2020-05-10 DIAGNOSIS — E039 Hypothyroidism, unspecified: Secondary | ICD-10-CM

## 2020-05-10 LAB — TSH: TSH - Thyroid Stimulating Hormone: 0.02 ??IU/mL — ABNORMAL LOW (ref 0.45–5.33)

## 2020-05-10 LAB — T3, FREE: T3, Free: 3.7 pg/mL (ref 2.5–3.9)

## 2020-05-10 LAB — T4, FREE: T4, Free: 1.5 ng/dL — ABNORMAL HIGH (ref 0.6–1.2)

## 2020-05-10 MED ORDER — rosuvastatin (CRESTOR) 5 MG tablet
5 | ORAL_TABLET | Freq: Every day | ORAL | 2 refills | Status: DC
Start: 2020-05-10 — End: 2020-08-25
  Filled 2020-05-16: qty 30, 30d supply, fill #0

## 2020-05-12 ENCOUNTER — Encounter: Admit: 2020-05-12 | Payer: PRIVATE HEALTH INSURANCE | Primary: Family

## 2020-05-12 NOTE — Progress Notes (Signed)
Health Coaching Session #2     05/12/20        Wins/successes:she has lost 9.5 pounds since starting she is learning to balance and be prepared w food with life provides [place] w unexpected obstacles, she reached out to eap got a few names and numbers  To connect with someone about her life stress, she was able to get an appointment for august with a counselor to begin her healing journey she is moving her body each morning by walking before she clocks in at work    Challenges/barriers:life stress    Discussion notes:she is going to continue with the process she found is working for her and will now start to add in body weight activity into her coaching sessions.      Action Steps   Sending her 2 videos on stretching and moving with health promotion via my chart, she is going to watch both and start to implement some of the ideas in the videos into her daily routine and when we next chat we will build on to what she come up with by continuing to create a body weight routine that works for her and her time.       Patient engaged with: Employee Assistance Program  And coaching   Does patient need to be referred to a PCP? No, patient already has PCP

## 2020-05-13 MED ORDER — levothyroxine (SYNTHROID) 150 MCG tablet
150 | ORAL_TABLET | Freq: Every day | ORAL | 1 refills | Status: DC
Start: 2020-05-13 — End: 2020-10-07
  Filled 2020-05-16: qty 90, 90d supply, fill #0

## 2020-05-13 NOTE — Telephone Encounter (Signed)
-----   Message from [name], FNP sent at 05/13/2020  3:47 PM PDT -----  Thyroid studies show that she is still in the hyperthyroid range. Recommend decreasing her dose to levothyroxine 150 mcg daily.

## 2020-05-13 NOTE — Telephone Encounter (Signed)
Kerry Allen is aware of results and recommendations and expressed understanding.

## 2020-05-16 MED FILL — LIOTHYRONINE 5 MCG TABLET: 5 ug | ORAL | 30 days supply | Qty: 30 | Fill #3

## 2020-05-23 ENCOUNTER — Encounter: Payer: PRIVATE HEALTH INSURANCE | Primary: Family

## 2020-05-31 ENCOUNTER — Encounter: Admit: 2020-05-31 | Payer: PRIVATE HEALTH INSURANCE | Primary: Family

## 2020-05-31 NOTE — Progress Notes (Signed)
Health Coaching Session #     05/31/20        Wins/successes: DOING GREAT TRACKING FOOD, HAS FINALLY BEEN ABLE TO FIND A GOOD BALANCE THAT SHE IS SUCCESSFUL WITH     Challenges/barriers: STRESS AND THE SCALE NOT MOVING IN A DIRECTION SHE WOULD LIKE   Discussion notes:TIME TO LIST OUT ALL YOUR WINS AND FOCUS ON NON SCALE VICTORIES, SHE WILL CONTINUE WALKING BEFORE WORK TIL IT GETS TOO DARK OUT AT 530AM TO CONTINUE THEN SHE WILL TAKE PART IN A FITNESS PLAN THAT SHE LOOKS FORWARD TOO.      Action Steps     HER HOMEWORK IS TO EXPLORE FITNESS PROGRAMS AND FIND 3 THAT SHE WILL LOOK FORWARD TOO DO DOING, THINKING OUTSIDE THE BOX AND REALLY ENJOYING SOMETHING DIFFERENT SO WHEN IT GETS TOO DARK IN THE MORNING TO WALK SHE HAS A PLAN B    Patient engaged with: Employee Assistance Program  AND COACHING   Does patient need to be referred to a PCP? No, patient already has PCP

## 2020-06-03 NOTE — Telephone Encounter (Signed)
From: [name]  To: [name], FNP  Sent: 06/02/2020 5:38 AM PDT  Subject: Non-Urgent Medical Question    [name],    On our last visit u talked abt my eyelids and abt correcting them if they are hanging over too much. I would like to talk more abt that..     I also did stop thr birth control and my face splotches are getting alot better. Is there a diffent form of estrogen I can take instead?    Thank you,    Kerry Allen

## 2020-06-13 MED ORDER — dextroamphetamine-amphetamine ER (ADDERALL XR) 20 MG 24 hr capsule
20 | ORAL_CAPSULE | Freq: Every morning | ORAL | 0 refills | Status: DC
Start: 2020-06-13 — End: 2020-07-19
  Filled 2020-06-16: qty 28, 28d supply, fill #0

## 2020-06-13 NOTE — Telephone Encounter (Signed)
LOV - 04/19/20  NOV - 06/20/20

## 2020-06-15 ENCOUNTER — Inpatient Hospital Stay: Admit: 2020-06-15 | Payer: PRIVATE HEALTH INSURANCE | Attending: Family | Primary: Family

## 2020-06-15 DIAGNOSIS — Z1231 Encounter for screening mammogram for malignant neoplasm of breast: Secondary | ICD-10-CM

## 2020-06-15 NOTE — Other (Signed)
Mammogram normal. Repeat in 1 year. Make sure HM is UTD. Please let patient know.

## 2020-06-16 ENCOUNTER — Telehealth: Admit: 2020-06-16 | Discharge: 2020-06-16 | Payer: PRIVATE HEALTH INSURANCE | Primary: Family

## 2020-06-16 DIAGNOSIS — Z789 Other specified health status: Secondary | ICD-10-CM

## 2020-06-16 MED ORDER — liothyronine (CYTOMEL) 5 MCG tablet
5 | ORAL_TABLET | Freq: Every day | ORAL | 2 refills | Status: DC
Start: 2020-06-16 — End: 2020-10-07
  Filled 2020-06-16: qty 30, 30d supply, fill #0

## 2020-06-16 MED FILL — ROSUVASTATIN 5 MG TABLET: 5 mg | ORAL | 30 days supply | Qty: 30 | Fill #1

## 2020-06-16 NOTE — Progress Notes (Signed)
Health Coaching Session #3     06/16/20        Wins/successes:SHE WENT ON VACATION AND WAS MORE AWARE OF HER FOOD CHOICES AND NOT JUST EATING AND FEELING GUILTY AFTERWARDS, SHE DID IN FACT GAIN A LITTLE WEIGHT BUT HAS REALZIED HOW MUCH EASIER IT WAS TO GET THE WEIGHT OFF as BEFORE IT WOULD HAVE BEEN MUCH HARDER TO GET RID OFF       Discussion notes: SHE HAS RESEARCHED FITNESS PROGRAMS, AND DECIDED ON DAILY BURN SHE SHE DO A 30 MIN BODY WEIGHT PROGRAM AT HOME B4 WORK 3X A WEEK MIN IN PLACE OF OUTDOOR WALKING     Action Steps         Patient engaged with: Employee Assistance Program AND COACHING    Does patient need to be referred to a PCP? No, patient already has PCP

## 2020-06-16 NOTE — Telephone Encounter (Signed)
-----   Message from [name], FNP sent at 06/16/2020  5:08 PM PDT -----  Mammogram normal. Repeat in 1 year. Make sure HM is UTD. Please let patient know.

## 2020-06-16 NOTE — Telephone Encounter (Signed)
Patient aware of the results and expressed understanding. In recall

## 2020-06-20 ENCOUNTER — Ambulatory Visit: Admit: 2020-06-20 | Discharge: 2020-07-08 | Payer: PRIVATE HEALTH INSURANCE | Attending: Family | Primary: Family

## 2020-06-20 DIAGNOSIS — R002 Palpitations: Secondary | ICD-10-CM

## 2020-06-20 MED ORDER — progesterone (PROMETRIUM) 100 MG capsule
100 | ORAL_CAPSULE | Freq: Every day | ORAL | 2 refills | 90.00000 days | Status: DC
Start: 2020-06-20 — End: 2020-10-07
  Filled 2020-06-23: qty 30, 30d supply, fill #0

## 2020-06-20 MED ORDER — gentamicin (GARAMYCIN) 0.1 % ointment
0.1 | Freq: Three times a day (TID) | TOPICAL | 0 refills | 7.00000 days | Status: AC
Start: 2020-06-20 — End: 2020-07-03
  Filled 2020-06-23: qty 15, 10d supply, fill #0

## 2020-06-20 MED ORDER — atenoloL (TENORMIN) 25 MG tablet
25 | ORAL_TABLET | Freq: Every day | ORAL | 2 refills | 90.00000 days | Status: DC
Start: 2020-06-20 — End: 2020-11-23
  Filled 2020-06-23: qty 30, 60d supply, fill #0

## 2020-06-20 NOTE — Patient Instructions (Signed)
Start on atenolol 12.5 mg daily to help lower pulse/control palpitations.     Recommend keeping track of when the palpations are occurring and if they are with activity, whether they could be hormonal (if the palpations seem to have a pattern).     Start on prometrium 100 mg nightly. Consider starting on vaginal estradiol if symptoms continue.     Start on Garamycin ophthalmic for chalazion.  Continue warm compresses.  Return to clinic if not improving.

## 2020-06-20 NOTE — Progress Notes (Signed)
Kerry Allen is a 44 y.o. female seen today for Hypertension and Hormone Management  .    Patient is alone. The Patient is the medical decision(s) maker attending today's appointment.    ASSESSMENT AND PLAN:  Problem List Items Addressed This Visit        Active Problems    Hormone replacement therapy (HRT)    Hypothyroidism (Chronic)    Relevant Orders    T4, Free -Routine    Thyroid Stimulating Hormone -Routine      Other Visit Diagnoses     Intermittent palpitations    -  Primary    Melasma        due to OCP    Chalazion of right upper eyelid              Plan:     Start on atenolol 12.5 mg daily to help lower pulse/control palpitations.     Recommend keeping track of when the palpations are occurring and if they are with activity, whether they could be hormonal (if the palpations seem to have a pattern).     Start on prometrium 100 mg nightly. Consider starting on vaginal estradiol if symptoms continue.     Start on Garamycin ophthalmic for chalazion.  Continue warm compresses.  Return to clinic if not improving.        Support for the above plan obtained during this visit:    HPI  Elevated resting heart rate  Reports pulse is often in the 90-100 range while resting. States that she has been having palpitations every couple of weeks. Has not correlated if this is possibly hormone related since she is not having cycles.  Palpitations could also be related to supratherapeutic dose of levothyroxine since her last TSH and free T4 were in the hyperthyroid range    Hormone Management:  Developed melasma after starting on the HRT. States that the HRT was helping with the hot flashes and she can really tell since stopping the OCP that her hot flashes are back.     Eye concerns  Patient reports that she has been having pain in her right upper eyelid.  Noted what appears to be a chalazion on exam.    ALLERGIES:  Allergies   Allergen Reactions   . Penicillins Rash     When she was a child       Outpatient Medications  Marked as Taking for the 06/20/20 encounter (Office Visit) with [name], FNP   Medication Sig Dispense Refill   . acetylcysteine (NAC) 600 mg Cap Take 1 capsule twice a day 180 capsule 1   . b complex vitamins capsule One Elevated Methylfolate+ (B-Complex) 1 cap every morning     . cholecalciferol, vitamin D3, 125 mcg (5,000 unit) capsule Take 5,000 IU daily and Monday, Wednesday and Friday take 09811 IU. 100 capsule prn   . dextroamphetamine-amphetamine ER (ADDERALL XR) 20 MG 24 hr capsule Take 1 capsule by mouth every morning for 28 days 28 capsule 0   . hydroCHLOROthiazide (HYDRODIURIL) 25 MG tablet Take 1 tablet by mouth daily for hypertension. 30 tablet 2   . INOSITOL, BULK, MISC by Miscellaneous route.     Marland Kitchen levomefolate calcium (L-METHYLFOLATE) 10 mg Cap capsule Take 10 mg daily 90 capsule    . levothyroxine (SYNTHROID) 150 MCG tablet Take 1 tablet by mouth daily. 90 tablet 1   . liothyronine (CYTOMEL) 5 MCG tablet Take 1 tablet by mouth daily. 30 tablet 2   .  omega-3 fatty acids 1,000 mg Cap Take 1 capsule nightly with largest meal 180 each 1   . rosuvastatin (CRESTOR) 5 MG tablet Take 1 tablet by mouth daily. 30 tablet 2       VITALS:  Vitals:    06/20/20 1525   BP: 122/70   Pulse: 97   Temp: 37 ?C (98.6 ?F)   TempSrc: Oral   SpO2: 100%   Weight: 170 lb (77.1 kg)   Height: 5' 2 (1.575 m)     Body mass index is 31.09 kg/m?Marland Kitchen  No LMP recorded. Patient has had an ablation.    Ideal body weight: 110 lb 7.2 oz (50.1 kg)  Adjusted ideal body weight: 134 lb 4.3 oz (60.9 kg)     Wt Readings from Last 3 Encounters:   06/20/20 170 lb (77.1 kg)   04/28/20 165 lb 12.8 oz (75.2 kg)   04/21/20 171 lb 4.8 oz (77.7 kg)       Review of Systems   Constitutional: Negative for chills and fever.   HENT: Negative for congestion and sore throat.    Respiratory: Negative for cough.    Cardiovascular: Positive for palpitations (Resting heart rate in the 90-110 range).   Genitourinary:        Perimenopausal symptoms   Skin:  Positive for color change and rash.   Neurological: Negative for dizziness and headaches.     Also see HPI for ROS    Physical Exam  Eyes:      Comments: Chalazion to right upper eyelid   Cardiovascular:      Rate and Rhythm: Normal rate and regular rhythm.   Pulmonary:      Effort: Pulmonary effort is normal.      Breath sounds: Normal breath sounds.   Skin:     Comments: Mild melasma to bilateral cheeks.   Psychiatric:      Comments: APPEARANCE: Well developed, well nourished, adequately groomed, appropriate dress for stated age and present situation    BEHAVIOR: Good eye contact, Calm and cooperative  ORIENTATION: person, place and time/date  MEMORY:  Recent and remote grossly intact.  CONCIOUSNESS: awake and responds appropriately  SPEECH: Normal rate, tone and volume.  MOTOR: normal psychomotor activity, No evidence of tremors or dystonia. No psychomotor agitation or retardation. Steady gait, normal station.  MOOD:  Happy, pleasant   AFFECT: Congruent, full range and appropriate.  THOUGHT PROCESS/ASSOCIATIONS: Clarity is coherent, Linear thoughts are intact.   THOUGHT CONTENT: appropriate to present situation; Denies SI or HI, AH/VH. No overt evidence of any delusions or paranoia.  INSIGHT: fair  JUDGMENT: fair  IMPULSE CONTROL: fair  ATTENTION/CONCENTRATION: Sufficient/Intact and appropriate.  FUND OF KNOWLEDGE:  Normal/average  LEVEL OF SELF ESTEEM:  Normal       Items reviewed in preparation for this visit:  The following portions of the patient's history were reviewed and updated as appropriate: allergies, current medications, past family history, past medical history, past surgical history and problem list.  When necessary and appropriate, prior medical records, medical history from family members or other providers were reviewed for coordination of care.       LABWORK COMPLETED PRIOR TO THIS VISIT:  Lab Walk-In on 05/10/2020   Component Date Value Ref Range Status   . T3, Free 05/10/2020 3.7  2.5 - 3.9  pg/mL Final   . T4, Free 05/10/2020 1.5* 0.6 - 1.2 ng/dL Final   . TSH - Thyroid Stimulating Hormone 05/10/2020 0.02* 0.45 - 5.33 ?IU/mL Final  RADIOLOGY STUDIES COMPLETED PRIOR TO THIS VISIT:  Mammography tomo screening implant bilateral digital w cad    Result Date: 06/16/2020  MAMMO DIGITAL BREAST TOMOSYNTHESIS SCREENING BILATERAL IMPLANTS 06/15/2020 3:08 PM PROVIDED CLINICAL INDICATIONS:  Visit for screening mammogram Encounter for screening mammogram for malignant neoplasm of breast ADDITIONAL CLINICAL HISTORY:  Screening.  Breast implants. COMPARISON:  03/17/2019 and 05/17/2017 TECHNIQUE:  Mediolateral oblique (MLO) and craniocaudal (CC) views of both breasts are obtained utilizing digital mammographic technique. Images are obtained with and without implant displacement technique. Implant displacement views use digital tomosynthesis mammographic technique and 2-D synthesized images. This examination is reviewed with the benefit of computer-aided detection software. BREAST COMPOSITION:  There are scattered areas of fibroglandular density. FINDINGS: Bilateral breast implants are present. No evidence of dominant mass lesion, suspicious microcalcifications, or other findings to suggest malignancy.     IMPRESSION: Benign findings. No evidence of malignancy. RECOMMENDATION:  Annual screening mammography. BI-RADS ASSESSMENT CATEGORY: (2) Benign. A. For early breast cancer detection, the [employer] of Radiology and the Society of Breast Imaging recommend: 1. Women age 60 and older should have a screening mammogram every year and should continue to do so for as long as they are in good health. 2.  Women at high risk (greater than 20% lifetime risk) should get an MRI and a mammogram every year. Women at moderately increased risk (15% to 20% lifetime risk) should talk with their doctors about the benefits and limitations of adding MRI screening to their yearly mammogram. Yearly MRI screening is not recommended for  women whose lifetime risk of breast cancer is less than 15%. B. A certain low percentage of breast carcinomas are occult to imaging and a negative report should not necessarily preclude biopsy of a clinically suspicious lesion.       Health Maintenance Due  Health Maintenance Due   Topic Date Due   . PAP SMEAR W/HPV CO-TESTING  07/26/2020       Past Medical History:   Diagnosis Date   . Abnormal ultrasound 05/13/2017   . hx of Graves' disease 2002    s/p I131  in 2002   . Postablative hypothyroidism     Hypothyroidism-Dr. Leonia Reader 2002   . Vitamin D deficiency      Past Surgical History:   Procedure Laterality Date   . ABDOMINAL SURGERY     . APPENDECTOMY     . AUGMENTATION MAMMAPLASTY Bilateral 2020   . CERVICAL BIOPSY     . PANNICULECTOMY  2005    tummy tuck   . PROCEDURE Right 06/27/2017    Procedure: LAPAROSCOPY;  ADHESIOLYSIS; ARGON BEAM TREATMENT OF EXTENSIVE ENDOMETRIOSIS;  Surgeon: Rolene Arbour, MD;  Location: [facility] [place] OR;  Service: Gynecology;  Laterality: Right;   . PROCEDURE N/A 11/08/2017    Procedure: COLONOSCOPY with sedation;  Surgeon: Olin Pia, MD;  Location: [facility] ENDO;  Service: Endoscopy;  Laterality: N/A;   . PROCEDURE Right 09/18/2018    Procedure: LAPAROSCOPY; RIGHTSALPINGO-OOPHORECTOMY; APPENDECTOMY;  LYSIS OF PERIADENEXAL ADHESIONS;  Surgeon: Rolene Arbour, MD;  Location: [facility] OR;  Service: Gynecology;  Laterality: Right;   . TUBAL LIGATION      Tubal ligation 2008     Family History   Problem Relation Age of Onset   . Diabetes Mother    . Heart attack Mother         during a heart cath   . Lung cancer Mother         stage III   . Tobacco Dependence  Mother    . Heart attack Father    . Tobacco Dependence Father    . No Known Health Problems Son    . Diabetes type II Maternal Grandmother    . Hypertension Maternal Grandmother    . Diabetes type II Paternal Grandmother    . Hypertension Paternal Grandmother    . Coronary artery disease Paternal Grandfather    . Heart attack Paternal Grandfather     . No Known Health Problems Son    . Breast cancer Neg Hx    . Ovarian cancer Neg Hx       Social History     Socioeconomic History   . Marital status: Married     Spouse name: Jerrod   . Number of children: 2   . Years of education: Not on file   . Highest education level: Not on file   Tobacco Use   . Smoking status: Never Smoker   . Smokeless tobacco: Never Used   . Tobacco comment: passive smoke exposure   Substance and Sexual Activity   . Alcohol use: Yes     Alcohol/week: 0.0 standard drinks     Comment: occasional    . Drug use: No   . Sexual activity: Yes     Partners: Male     Birth control/protection: Surgical     Comment: Tubal Ligation   Other Topics Concern   . Caffeine Concern Yes     Comment: moderate   . Exercise Yes     Comment: occasional        I had a thoughtful and careful discussion regarding the issues today. All questions were answered. I educated and reassured. I encouraged followup as needed. Hand written instructions were given via the After Visit Summary including reference literature as appropriate. Today's medical decision maker  verbalize understanding of the treatment plan and are in agreement with the treatment plan goals. Medical decision maker is aware of how to contact clinical provider if concerns arise.      FOLLOW-UP:  Return in about 6 weeks (around 08/01/2020) for F/U to review lab results ordered at today's visit.       Greater than 50% of visit spent in counseling/coordination of care.  Time spent with patient in discussion and coordination of patient's care: 15 min    This note was transcribed using speech recognition software. Please contact us for clarification if any questions arise relating to the wording of this document.

## 2020-07-19 MED ORDER — dextroamphetamine-amphetamine ER (ADDERALL XR) 20 MG 24 hr capsule
20 | ORAL_CAPSULE | Freq: Every morning | ORAL | 0 refills | Status: DC
Start: 2020-07-19 — End: 2020-08-25
  Filled 2020-07-22: qty 28, 28d supply, fill #0

## 2020-07-19 NOTE — Telephone Encounter (Signed)
LOV - 06/20/20  NOV - 0  Labs - 03/01/20

## 2020-07-22 MED FILL — LEVOTHYROXINE 150 MCG TABLET: 150 ug | ORAL | 90 days supply | Qty: 90 | Fill #1

## 2020-07-22 MED FILL — PROGESTERONE MICRONIZED 100 MG CAPSULE: 100 mg | ORAL | 30 days supply | Qty: 30 | Fill #1

## 2020-07-22 MED FILL — ROSUVASTATIN 5 MG TABLET: 5 mg | ORAL | 30 days supply | Qty: 30 | Fill #2

## 2020-07-22 MED FILL — LIOTHYRONINE 5 MCG TABLET: 5 ug | ORAL | 30 days supply | Qty: 30 | Fill #1

## 2020-07-28 ENCOUNTER — Telehealth: Admit: 2020-07-28 | Discharge: 2020-07-28 | Payer: PRIVATE HEALTH INSURANCE | Primary: Family

## 2020-07-28 DIAGNOSIS — Z789 Other specified health status: Secondary | ICD-10-CM

## 2020-07-28 NOTE — Progress Notes (Signed)
Health Coaching Session #5     07/28/20        Wins/successes:IS DOING GREAT WITH HER FITNESS ROUTINE STAYING ON TRACK AND ACTIVE WHETHER IT IS A BIKE RIDE OR THE VIRTUAL FIT PROGRAM SHE SUBSCRIBES TO SHE IS MOVING HER BODY ON A REGULAR BASIS. SHE IS DOING GREAT GETTING HER FOOD TRACKED AND BALANCING OUT HER FOOD CHOICES   Challenges/barriers: SHE IS STUCK ON THE SCALE NOT MOVING as QUICKLY as SHE HAD HOPED; SHE FORGOT TO TAKE HER THYROID MEDS SO SHE IS BACK ON TRACK WITH THAT NOW   Discussion notes: WE DISCUSSED WAYS TO LOOK FOR IMPROVEMENT SWAPPING OUT SOME OF HER PROCESSED CARB SOURCES AND MAKING SURE THAT WATER IS DIALED IN, WE ALSO DISCUSSED STRESS AND CORTISOL AND HOW IT EFFECTS [place]. WE ALSO DISCUSSED NON SCALE VICTORIES AND WHERE THEY ARE HIDING      Action Steps   LOOK FOR BETTER SWAPS FOR RICE BREAD PASTAS, CHIPS ETC.. KEEP MOVING YOUR BODY WITH YOUR FITNESS PLAN, BE SURE TO HYDRATE PROPERLY    SHE WILL COMPARE HER PROGRESS PICS AND DOING REGULAR MEASUREMENT TO ENSURE THAT SHE CAN SEE WHERE THE CHANGES ARE TAKING EFFECT WHILE THE SCALE ISNT SHOWING UP THE WAY WE WANT IT TO      Patient engaged with: None ACTIE WITH COACHING     Does patient need to be referred to a PCP? No, patient already has PCP

## 2020-08-10 MED FILL — ATENOLOL 25 MG TABLET: 25 mg | ORAL | 60 days supply | Qty: 30 | Fill #1

## 2020-08-16 ENCOUNTER — Ambulatory Visit: Payer: PRIVATE HEALTH INSURANCE | Attending: CMA, LXMO | Primary: Family

## 2020-08-16 DIAGNOSIS — Z23 Encounter for immunization: Secondary | ICD-10-CM

## 2020-08-19 ENCOUNTER — Other Ambulatory Visit: Admit: 2020-08-19 | Discharge: 2020-08-19 | Payer: PRIVATE HEALTH INSURANCE | Primary: Family

## 2020-08-19 DIAGNOSIS — E890 Postprocedural hypothyroidism: Secondary | ICD-10-CM

## 2020-08-19 LAB — T4, FREE: T4, Free: 1 ng/dL (ref 0.6–1.2)

## 2020-08-19 LAB — TSH: TSH - Thyroid Stimulating Hormone: 1.74 ??IU/mL (ref 0.45–5.33)

## 2020-08-25 NOTE — Telephone Encounter (Signed)
LOV - 06/20/20  NOV - 0

## 2020-08-26 MED ORDER — dextroamphetamine-amphetamine ER (ADDERALL XR) 20 MG 24 hr capsule
20 | ORAL_CAPSULE | Freq: Every morning | ORAL | 0 refills | Status: DC
Start: 2020-08-26 — End: 2020-10-07
  Filled 2020-08-31: qty 28, 28d supply, fill #0

## 2020-08-26 MED ORDER — rosuvastatin (CRESTOR) 5 MG tablet
5 | ORAL_TABLET | Freq: Every day | ORAL | 2 refills | Status: DC
Start: 2020-08-26 — End: 2021-04-06
  Filled 2020-08-31: qty 30, 30d supply, fill #0

## 2020-08-31 MED FILL — PROGESTERONE MICRONIZED 100 MG CAPSULE: 100 mg | ORAL | 30 days supply | Qty: 30 | Fill #2

## 2020-08-31 MED FILL — LIOTHYRONINE 5 MCG TABLET: 5 ug | ORAL | 30 days supply | Qty: 30 | Fill #2

## 2020-09-09 NOTE — Telephone Encounter (Signed)
From: [name]  To: [name], FNP  Sent: 09/09/2020 5:05 AM PDT  Subject: Non-Urgent Medical Question    Hi,    Its been about 6 months and I was wondering if its time for a comprehensive metabolic panel, Lipid panel and the check on my vitamin levels? If not no worries just looking at my past labs and notice that it has been awhile.    Thank you,    Kerry Allen

## 2020-09-13 ENCOUNTER — Telehealth: Admit: 2020-09-13 | Discharge: 2020-09-13 | Payer: PRIVATE HEALTH INSURANCE | Primary: Family

## 2020-09-13 DIAGNOSIS — Z789 Other specified health status: Secondary | ICD-10-CM

## 2020-09-13 NOTE — Progress Notes (Signed)
Health Coaching Session #6     09/13/20  Weight: 166 lb 6.4 oz (75.5 kg)     Wins/successes: doing great with tracking her food in my fitness pal, has lost about 10 pounds since coaching started, she feels good most of the time   Challenges/barriers: stress and life obstacles, have triggered her to not eat as much food, she is aware that she has got off track and has a plan to jump back in   Discussion notes: we discussed creating a list/visual of all the obstacles she knows tend to get in her way from succeeding and create options that will help her overcome them in the event the pop up as they always do she will be better prepared to overcome them. She is back on track with small steps towards her fitness goals, she is slowly get back with her food now that she has more food to accomplish her macros, she is going to ask to have her labs drawn before the end of this year to see the improvement she has made this year.      Action Steps   Work on ways to boost that HDL omega 3's avocado, nuts etc... along with body weight endurance activities other than that the rest of her labs looked pretty good in June 2021.     Scheduling a session in the near future to get her measurements and fat checked is coming...      Patient engaged with: [employer] and coaching     Does patient need to be referred to a PCP? No, patient already has PCP

## 2020-10-06 ENCOUNTER — Telehealth: Admit: 2020-10-06 | Discharge: 2020-10-06 | Payer: PRIVATE HEALTH INSURANCE | Attending: MD | Primary: Family

## 2020-10-06 DIAGNOSIS — J0100 Acute maxillary sinusitis, unspecified: Secondary | ICD-10-CM

## 2020-10-06 MED ORDER — azithromycin (ZITHROMAX) 250 MG tablet
250 | ORAL_TABLET | ORAL | 0 refills | 5.00000 days | Status: DC
Start: 2020-10-06 — End: 2021-11-01
  Filled 2020-10-06: qty 6, 5d supply, fill #0

## 2020-10-06 NOTE — Progress Notes (Signed)
I connected to the patient with live audio/video using MyChart.    Confirmed patient's Name and Date of Birth: done    The patient's current location is [address] [facility] [place] [address].  The provider's location during this telemedicine visit is Other: [phone].      History obtained from patient.    Instructed patient/guardian: If we lose connection and do not automatically reconnect, I'll call patient back at their telephone number.  Confirmed patient is available at the following number: Home Number      Provider read the below statement and the patient consented to continue with the visit: Yes    This visit is being done by telemedicine because of COVID-19 restrictions.  Although telemedicine has great potential benefits, we can not see everything by telemedicine. As with any medical procedure there are potential risks, such as insufficient information for good medical decision making and risks to private information. Risk to private information is higher when using third-party apps like Facetime or Skype. Facetime/Skype visits are only allowed during the COVID-19 national emergency. We also limit telemedicine to certain non-emergent concerns.     Do you feel you have an emergency? No    Kerry Allen is a 44 y.o. female who presents with Sore Throat and Otalgia  .      HISTORY OF PRESENT ILLNESS     URI   This is a new problem. The current episode started in the past 7 days. The problem has been unchanged. There has been no fever. Associated symptoms include congestion, headaches and sinus pain. Pertinent negatives include no coughing, diarrhea, nausea, sore throat, vomiting or wheezing. She has tried nothing for the symptoms. The treatment provided no relief.          I reviewed the following medical history:  Allergies   Allergen Reactions   . Penicillins Rash     When she was a child     Past Medical History:   Diagnosis Date   . Abnormal ultrasound 05/13/2017   . hx of Graves' disease 2002    s/p I131  in  2002   . Postablative hypothyroidism     Hypothyroidism-Dr. Leonia Reader 2002   . Vitamin D deficiency      Past Surgical History:   Procedure Laterality Date   . ABDOMINAL SURGERY     . APPENDECTOMY     . AUGMENTATION MAMMAPLASTY Bilateral 2020   . CERVICAL BIOPSY     . PANNICULECTOMY  2005    tummy tuck   . PROCEDURE Right 06/27/2017    Procedure: LAPAROSCOPY;  ADHESIOLYSIS; ARGON BEAM TREATMENT OF EXTENSIVE ENDOMETRIOSIS;  Surgeon: Rolene Arbour, MD;  Location: [facility] OR;  Service: Gynecology;  Laterality: Right;   . PROCEDURE N/A 11/08/2017    Procedure: COLONOSCOPY with sedation;  Surgeon: Olin Pia, MD;  Location: [facility] ENDO;  Service: Endoscopy;  Laterality: N/A;   . PROCEDURE Right 09/18/2018    Procedure: LAPAROSCOPY; RIGHTSALPINGO-OOPHORECTOMY; APPENDECTOMY;  LYSIS OF PERIADENEXAL ADHESIONS;  Surgeon: Rolene Arbour, MD;  Location: [facility] OR;  Service: Gynecology;  Laterality: Right;   . TUBAL LIGATION      Tubal ligation 2008     Outpatient Medications Marked as Taking for the 10/06/20 encounter (Telemedicine2) with Daphene Calamity, MD   Medication Sig Dispense Refill   . acetylcysteine (NAC) 600 mg Cap Take 1 capsule twice a day 180 capsule 1   . atenoloL (TENORMIN) 25 MG tablet Take 1/2 tablet by mouth daily. 30 tablet  2   . b complex vitamins capsule One Elevated Methylfolate+ (B-Complex) 1 cap every morning     . cholecalciferol, vitamin D3, 125 mcg (5,000 unit) capsule Take 5,000 IU daily and Monday, Wednesday and Friday take 78295 IU. 100 capsule prn   . dextroamphetamine-amphetamine ER (ADDERALL XR) 20 MG 24 hr capsule Take 1 capsule by mouth every morning for 28 days 28 capsule 0   . hydroCHLOROthiazide (HYDRODIURIL) 25 MG tablet Take 1 tablet by mouth daily for hypertension. 30 tablet 2   . INOSITOL, BULK, MISC by Miscellaneous route.     Marland Kitchen levomefolate calcium (L-METHYLFOLATE) 10 mg Cap capsule Take 10 mg daily 90 capsule    . levothyroxine (SYNTHROID) 150 MCG tablet Take 1 tablet by mouth daily. 90  tablet 1   . liothyronine (CYTOMEL) 5 MCG tablet Take 1 tablet by mouth daily. 30 tablet 2   . omega-3 fatty acids 1,000 mg Cap Take 1 capsule nightly with largest meal 180 each 1   . progesterone (PROMETRIUM) 100 MG capsule Take 1 capsule by mouth daily for hormone replacement therapy 30 capsule 2   . rosuvastatin (CRESTOR) 5 MG tablet Take 1 tablet by mouth daily. 30 tablet 2       SOCIAL HISTORY     Social History     Tobacco Use   . Smoking status: Never Smoker   . Smokeless tobacco: Never Used   . Tobacco comment: passive smoke exposure   Substance Use Topics   . Alcohol use: Yes     Alcohol/week: 0.0 standard drinks     Comment: occasional    . Drug use: No     Social History     Substance and Sexual Activity   Drug Use No       REVIEW OF SYSTEMS     Review of Systems   Constitutional: Negative for chills, diaphoresis and fever.   HENT: Positive for congestion and sinus pain. Negative for sore throat.    Respiratory: Negative for cough, chest tightness, shortness of breath and wheezing.    Gastrointestinal: Negative for diarrhea, nausea and vomiting.   Neurological: Positive for headaches.       PHYSICAL EXAM     Vitals     OB Status   Ablation               Last BMI on file if available: There is no height or weight on file to calculate BMI.  Physical Exam   Constitutional: She is oriented to person, place, and time. She appears well-developed and well-nourished. She is active. No distress.   HENT:   Head: Normocephalic and atraumatic.   Eyes: Conjunctivae and EOM are normal. Pupils are equal, round, and reactive to light.   Pulmonary/Chest: Breath sounds normal. No respiratory distress.   Neurological: She is alert and oriented to person, place, and time. No cranial nerve deficit.   Skin: She is not diaphoretic.   Psychiatric: She has a normal mood and affect. Her behavior is normal. Judgment and thought content normal.     *Any tenderness or palpable anomaly was elicited/reported by patient or caregiver  through provider instructed exam.      DIAGNOSTIC STUDIES     Lab results last 7d: No results found for this or any previous visit (from the past 168 hour(s)).  X-Ray results last 24h: No results found.    DISCUSSION     I have encouraged the patient to follow-up with No follow-ups on file.  I have recommended the patient seek medical attention right away for new or worsening symptoms.    Prior to completing the visit, all questions have been answered and they are in agreement with the plan.    ASSESSMENT AND PLAN          ICD-10-CM    1. Acute non-recurrent maxillary sinusitis  J01.00 azithromycin (ZITHROMAX) 250 MG tablet     Acute non-recurrent maxillary sinusitis   New problem to me, uncontrolled as patient's been having about a week of sinus pressure and pain, some cough.  No fevers chills, no Covid symptoms.  She is vaccinated against Covid.  No sick contacts.  I discussed with patient we will send in a prescription for Z-Pak, advised patient on supportive care with sinus rinses and as needed Afrin.  Advised patient to contact us if her symptoms worsen.  Patient expressed understanding.      New Prescriptions    AZITHROMYCIN (ZITHROMAX) 250 MG TABLET    Take 2 tabs PO on day one, then 1 tab PO q day until finished.       FOLLOW UP / INSTRUCTIONS     For questions regarding your plan of care, please contact [phone].    Disposition of patient is to Home.    What would patient/caregiver have done regarding their complaint without today's video visit? Had a video visit     This chart was produced with the Epic system using voice recognition software, manual transcription, or both. Despite concurrent proofreading, please note that transcription errors are common and may not reflect the true intent of reporter.  Please contact us for clarification if any questions arise relating to the wording of this document.

## 2020-10-06 NOTE — Assessment & Plan Note (Signed)
New problem to me, uncontrolled as patient's been having about a week of sinus pressure and pain, some cough.  No fevers chills, no Covid symptoms.  She is vaccinated against Covid.  No sick contacts.  I discussed with patient we will send in a prescription for Z-Pak, advised patient on supportive care with sinus rinses and as needed Afrin.  Advised patient to contact us if her symptoms worsen.  Patient expressed understanding.

## 2020-10-07 MED ORDER — liothyronine (CYTOMEL) 5 MCG tablet
5 | ORAL_TABLET | Freq: Every day | ORAL | 2 refills | 90.00000 days | Status: DC
Start: 2020-10-07 — End: 2021-02-22
  Filled 2020-10-12: qty 30, 30d supply, fill #0

## 2020-10-07 MED ORDER — dextroamphetamine-amphetamine ER (ADDERALL XR) 20 MG 24 hr capsule
20 | ORAL_CAPSULE | Freq: Every morning | ORAL | 0 refills | Status: DC
Start: 2020-10-07 — End: 2020-11-23
  Filled 2020-10-12: qty 28, 28d supply, fill #0

## 2020-10-07 MED ORDER — levothyroxine (SYNTHROID) 150 MCG tablet
150 | ORAL_TABLET | Freq: Every day | ORAL | 1 refills | Status: DC
Start: 2020-10-07 — End: 2021-11-15
  Filled 2020-10-12: qty 90, 90d supply, fill #0

## 2020-10-07 MED ORDER — progesterone (PROMETRIUM) 100 MG capsule
100 | ORAL_CAPSULE | Freq: Every day | ORAL | 2 refills | 90.00000 days | Status: DC
Start: 2020-10-07 — End: 2021-02-22
  Filled 2020-10-12: qty 30, 30d supply, fill #0

## 2020-10-12 MED FILL — ROSUVASTATIN 5 MG TABLET: 5 mg | ORAL | 30 days supply | Qty: 30 | Fill #1

## 2020-10-12 MED FILL — ATENOLOL 25 MG TABLET: 25 mg | ORAL | 60 days supply | Qty: 30 | Fill #2

## 2020-10-13 ENCOUNTER — Telehealth: Admit: 2020-10-13 | Discharge: 2020-10-13 | Payer: PRIVATE HEALTH INSURANCE | Primary: Family

## 2020-10-13 DIAGNOSIS — Z789 Other specified health status: Secondary | ICD-10-CM

## 2020-10-13 NOTE — Progress Notes (Signed)
Health Coaching Session #7     10/13/20        Wins/successes: Kerry Allen IS BACK ON TRACK WITH HER FITNESS AND FINDING HEALTHY WAYS TO COPE WITH STRESS, SHE IS DOING GREAT TRACKING HER FOOD    Challenges/barriers: LIFE STRESS AND JUST NEEDING SOMEONE TO TELL HER WHAT TO DO    Discussion notes: Kerry Allen WILL CONTINUE WITH THE PATH SHE IS ON, I WILL SEND HER VIDEOS ON STRESS MANAGEMENT TO HELP HER CONTINUE TO FOCUS ON POSITIVE MINDSET     Action Steps         Patient engaged with: None ACTIVE WITH COACHING    Does patient need to be referred to a PCP? No, patient already has PCP

## 2020-11-23 MED ORDER — dextroamphetamine-amphetamine ER (ADDERALL XR) 20 MG 24 hr capsule
20 | ORAL_CAPSULE | Freq: Every morning | ORAL | 0 refills | Status: DC
Start: 2020-11-23 — End: 2021-01-05
  Filled 2020-11-29: qty 28, 28d supply, fill #0

## 2020-11-23 MED ORDER — atenoloL (TENORMIN) 25 MG tablet
25 | ORAL_TABLET | Freq: Every day | ORAL | 2 refills | 90.00000 days | Status: DC
Start: 2020-11-23 — End: 2021-11-01
  Filled 2020-11-29: qty 30, 60d supply, fill #0

## 2020-11-29 MED FILL — PROGESTERONE MICRONIZED 100 MG CAPSULE: 100 mg | ORAL | 30 days supply | Qty: 30 | Fill #1

## 2020-11-29 MED FILL — LIOTHYRONINE 5 MCG TABLET: 5 ug | ORAL | 30 days supply | Qty: 30 | Fill #1

## 2020-11-29 MED FILL — HYDROCHLOROTHIAZIDE 25 MG TABLET: 25 mg | ORAL | 30 days supply | Qty: 30 | Fill #1

## 2020-12-01 MED FILL — ROSUVASTATIN 5 MG TABLET: 5 mg | ORAL | 30 days supply | Qty: 30 | Fill #2

## 2020-12-08 ENCOUNTER — Other Ambulatory Visit: Payer: Self-pay | Admitting: Family Medicine

## 2020-12-09 ENCOUNTER — Other Ambulatory Visit: Payer: Self-pay | Admitting: Family Medicine

## 2020-12-09 DIAGNOSIS — R221 Localized swelling, mass and lump, neck: Secondary | ICD-10-CM

## 2020-12-12 ENCOUNTER — Ambulatory Visit
Admission: RE | Admit: 2020-12-12 | Discharge: 2020-12-12 | Disposition: A | Discharge status: Home / Self Care | Payer: 59 | Source: Ambulatory Visit | Attending: Family Medicine | Admitting: Family Medicine

## 2020-12-12 DIAGNOSIS — R221 Localized swelling, mass and lump, neck: Secondary | ICD-10-CM

## 2020-12-15 ENCOUNTER — Other Ambulatory Visit: Payer: Self-pay | Admitting: Family Medicine

## 2020-12-15 DIAGNOSIS — E041 Nontoxic single thyroid nodule: Secondary | ICD-10-CM

## 2020-12-21 ENCOUNTER — Other Ambulatory Visit (HOSPITAL_COMMUNITY)
Admission: RE | Admit: 2020-12-21 | Discharge: 2020-12-21 | Disposition: A | Discharge status: Home / Self Care | Payer: 59 | Source: Ambulatory Visit | Attending: Family Medicine | Admitting: Family Medicine

## 2020-12-21 ENCOUNTER — Ambulatory Visit
Admission: RE | Admit: 2020-12-21 | Discharge: 2020-12-21 | Disposition: A | Discharge status: Home / Self Care | Payer: 59 | Source: Ambulatory Visit | Attending: Family Medicine | Admitting: Family Medicine

## 2020-12-21 DIAGNOSIS — E041 Nontoxic single thyroid nodule: Secondary | ICD-10-CM | POA: Diagnosis present

## 2020-12-21 DIAGNOSIS — D440 Neoplasm of uncertain behavior of thyroid gland: Secondary | ICD-10-CM | POA: Insufficient documentation

## 2020-12-21 NOTE — Telephone Encounter (Signed)
From: [name]  To: [name], FNP  Sent: 12/20/2020 7:19 AM PST  Subject: Question    I had a scare last night woke up with to my throat closed and couldn't breath. This happened once before abt 5 years ago but this was a lot longer. I have been having major dry mouth the last cpl of months and some cold sores. I'm still on my weight loss journey but wondering if this is all related or just a one time fluke?

## 2020-12-22 ENCOUNTER — Encounter: Admit: 2020-12-22 | Payer: PRIVATE HEALTH INSURANCE | Primary: Family

## 2020-12-22 DIAGNOSIS — Z789 Other specified health status: Secondary | ICD-10-CM

## 2020-12-22 LAB — CYTOLOGY - NON PAP

## 2020-12-22 NOTE — Progress Notes (Signed)
Personalized Wellness Plan Assessment     Focus area(s):Kerry Allen FEELS GOOD ABOUT HER 10 POUND WEIGHT LOSS PROGRESS SO FAR SHE TRACKS HER FOOD AND TRIES TO BALANCE THINGS OUT AND ENGAGES IN FITNESS WHEN TIME PERMITS IT   Food tracking? Y or N   Exercise routine:YES CARDIO AND BODY WEIGHT ACTIVITIES      Visit Notes     Kerry Allen TRACKS HER FOOD WITH MY FITNESS PAL AND DOES WORKOUT SHE KNOWS SHE NEEDS TO EAT MORE FOOD AND IS LOOKING FOR WAYS TO BALANCE THINGS OUT MORE FREQUENTLY VERSUS JUST SOME DAYS, SHE IS AWARE SHE NEEDS TO PROCESS THROUGH HER DAY BETTER BUT BETWEEN WORK AT [place] AND HER HUSBANDS BUSINESS SHE HAS BEEN PRETTY OVERWHELMED   Agreed Action Steps         Referred patient to: Empower, Pension scheme manager, Film/video editor and Health & Wellness Coaching    Does patient need to be referred to a PCP? No, patient already has PCP

## 2021-01-05 MED ORDER — dextroamphetamine-amphetamine ER (ADDERALL XR) 20 MG 24 hr capsule
20 | ORAL_CAPSULE | Freq: Every morning | ORAL | 0 refills | Status: DC
Start: 2021-01-05 — End: 2021-02-22
  Filled 2021-01-12: qty 28, 28d supply, fill #0

## 2021-01-05 NOTE — Telephone Encounter (Signed)
LOV - 10/06/20  NOV - 0

## 2021-01-06 ENCOUNTER — Encounter (HOSPITAL_COMMUNITY): Payer: Self-pay

## 2021-01-10 NOTE — Telephone Encounter (Signed)
From: Kerry Allen  To: [name], FNP  Sent: 01/05/2021 4:18 AM PST  Subject: Labs    It has been a year since my last VitB6, progesterone's, estradiol, and almost a year for the Lipid panel and comprehensive panel. I was wondering if I can get an order for these labs to see if they have improved. I have been working on trying to get my hormones' and cholesterol under contr

## 2021-01-12 ENCOUNTER — Ambulatory Visit: Payer: Self-pay | Admitting: Surgery

## 2021-01-12 MED FILL — LIOTHYRONINE 5 MCG TABLET: 5 ug | ORAL | 30 days supply | Qty: 30 | Fill #2

## 2021-01-12 MED FILL — PROGESTERONE MICRONIZED 100 MG CAPSULE: 100 mg | ORAL | 30 days supply | Qty: 30 | Fill #2

## 2021-01-31 ENCOUNTER — Other Ambulatory Visit: Admit: 2021-01-31 | Discharge: 2021-01-31 | Payer: PRIVATE HEALTH INSURANCE | Primary: Family

## 2021-01-31 DIAGNOSIS — N951 Menopausal and female climacteric states: Secondary | ICD-10-CM

## 2021-01-31 LAB — T4, FREE: T4, Free: 0.8 ng/dL (ref 0.6–1.2)

## 2021-01-31 LAB — VITAMIN B6: Vitamin B6 (Pyridoxal 5-Phosphate): 3 mcg/L — ABNORMAL LOW (ref 5–50)

## 2021-01-31 LAB — COMPREHENSIVE METABOLIC PANEL
ALT - Alanine Aminotransferase: 9 IU/L (ref 7–52)
AST - Aspartate Aminotransferase: 10 IU/L (ref 8–39)
Albumin/Globulin Ratio: 1.5 (ref >0.9)
Albumin: 4 g/dL (ref 3.5–5.0)
Alkaline Phosphatase: 73 IU/L (ref 34–104)
Anion Gap: 7 mmol/L (ref 4–13)
BUN: 12 mg/dL (ref 6–23)
Bilirubin Total: 0.6 mg/dL (ref 0.3–1.2)
CO2 - Carbon Dioxide: 25 mmol/L (ref 21–31)
Calcium: 8.9 mg/dL (ref 8.6–10.3)
Chloride: 109 mmol/L (ref 98–111)
Creatinine: 0.73 mg/dL (ref 0.55–1.10)
GFR Estimated Female: >60 mL/min/{1.73_m2} (ref >=60)
Globulin: 2.6 g/dL (ref 2.2–3.7)
Glucose: 91 mg/dL (ref 80–99)
Potassium: 4.3 mmol/L (ref 3.5–5.1)
Protein Total: 6.6 g/dL (ref 6.0–8.0)
Sodium: 141 mmol/L (ref 135–143)

## 2021-01-31 LAB — VITAMIN B12/FOLATE
Folate: 21.6 ng/mL (ref 5.9–24.8)
Vitamin B12: >1500 pg/mL — ABNORMAL HIGH (ref 150–840)

## 2021-01-31 LAB — CORONARY RISK LIPID PANEL REFLEX DIRECT LDL
Cholesterol, HDL: 39 mg/dL — ABNORMAL LOW (ref >=40)
Cholesterol: 161 mg/dL (ref <200)
LDL Calculated: 104 mg/dL — ABNORMAL HIGH (ref <100)
Triglyceride: 92 mg/dL (ref 30–149)

## 2021-01-31 LAB — 25-OH HYDROXY VITAMIN D, CALCIFEROL, TOTAL OF D2 & D3: Vitamin D 25-OH Total: 28.1 ng/mL — ABNORMAL LOW (ref 30.0–100.0)

## 2021-01-31 LAB — PROGESTERONE: Progesterone: 1 ng/mL (ref <50.8)

## 2021-01-31 LAB — VITAMIN B2: Vitamin B2: 4 mcg/L (ref 1–19)

## 2021-01-31 LAB — TSH: TSH - Thyroid Stimulating Hormone: 7.89 ??IU/mL — ABNORMAL HIGH (ref 0.45–5.33)

## 2021-01-31 LAB — ESTRADIOL: Estradiol: 16.8 pg/mL

## 2021-01-31 MED ORDER — hydroCHLOROthiazide (HYDRODIURIL) 25 MG tablet
25 | ORAL_TABLET | Freq: Every day | ORAL | 2 refills | Status: DC
Start: 2021-01-31 — End: 2021-06-22
  Filled 2021-02-27: qty 30, 30d supply, fill #0

## 2021-01-31 MED FILL — HYDROCHLOROTHIAZIDE 25 MG TABLET: 25 mg | ORAL | 30 days supply | Qty: 30 | Fill #0

## 2021-02-03 ENCOUNTER — Encounter: Admit: 2021-02-03 | Payer: PRIVATE HEALTH INSURANCE | Primary: Family

## 2021-02-03 DIAGNOSIS — Z789 Other specified health status: Secondary | ICD-10-CM

## 2021-02-03 NOTE — Progress Notes (Signed)
Health Coaching Session #0     02/03/21        Connect/Review: [name] IS DOING BETTER WITH TRACKING HER FOOD AND IS NOW BACK ON TRACK WITH HER FITNESS    Opportunities: LIPID PANEL CONCERNS   Challenges: FITNESS AND STRESS   Social Accountability/Support:   Tools/Techniques:     Visit Notes     Kerry Allen JUST STARTED WORKING OUT AGAIN, BUT SHE HAS BEEN UNDER A LOT OF STRESS WITH HER FAMILY BUSINESS AND SELLING THEIR HOME AND FITNESS AND TRACKING FOOD SOMEWHAT WENT TO THE SIDE. SHE IS NOW BACK ON TRACK AND FEELS BETTER AFTER 1 WEEK OF BACK INTO THE SWING OF A ROUTINE. WE WENT OVER HER LABS SHE JUST HAD COMPLETED HER GLUCOSE IS MUCH BETTER BUT HER HLD DIPPED SIGNIFICANTLY SO WE DOVE INTO WHAT CAUSES THAT AND HOW TO GET BACK ON TRACK WITH A HEALTHY HLD AND BY RAISING THAT HLD SHE SHOULD BE ABLE TO GET HER LDL BACK UNDER CONTROL.   Agreed Action Steps         Patient engaged with: Health & Wellness Coaching    Does patient need to be referred to a PCP? No, patient already has PCP

## 2021-02-06 ENCOUNTER — Encounter (HOSPITAL_COMMUNITY): Payer: Self-pay

## 2021-02-06 NOTE — Patient Instructions (Addendum)
DUE TO COVID-19 ONLY ONE VISITOR IS ALLOWED TO COME WITH YOU AND STAY IN THE WAITING ROOM ONLY DURING PRE OP AND PROCEDURE.   **NO VISITORS ARE ALLOWED IN THE SHORT STAY AREA OR RECOVERY ROOM!!**  IF YOU WILL BE ADMITTED INTO THE HOSPITAL YOU ARE ALLOWED ONLY TWO SUPPORT PEOPLE DURING VISITATION HOURS ONLY (10AM -8PM)   . The support person(s) may change daily. . The support person(s) must pass our screening, gel in and out, and wear a mask at all times, including in the patient's room. . Patients must also wear a mask when staff or their support person are in the room.  No visitors under the age of 39. Any visitor under the age of 8 must be accompanied by an adult.    COVID SWAB TESTING MUST BE COMPLETED ON:  Friday, 02-17-21 @ 2:45 PM  testing. Follow instructions on handout.)   Your procedure is scheduled on: Monday, 02-20-21   Report to [facility] Main  Entrance    Report to admitting at 7:30 AM   Call this number if you have problems the morning of surgery [phone]   Do not eat food :After Midnight.   May have liquids until 6:30 AM day of surgery  CLEAR LIQUID DIET  Foods Allowed                                                                     Foods Excluded  Water, Black Coffee and tea, regular and decaf              liquids that you cannot  Plain Jell-O in any flavor  (No red)                                    see through such as: Fruit ices (not with fruit pulp)                                      milk, soups, orange juice              Iced Popsicles (No red)                                      All solid food                                   Apple juices Sports drinks like Gatorade (No red) Lightly seasoned clear broth or consume(fat free) Sugar, honey syrup    Oral Hygiene is also important to reduce your risk of infection.                                    Remember - BRUSH YOUR TEETH THE MORNING OF SURGERY WITH YOUR REGULAR TOOTHPASTE   Do NOT  smoke after Midnight   Take these medicines the morning of surgery  with A SIP OF WATER:  Levothyroxine, Cytomel  DO NOT TAKE ANY ORAL DIABETIC MEDICATIONS DAY OF YOUR SURGERY                               You may not have any metal on your body including hair pins, jewelry, and body piercings             Do not wear make-up, lotions, powders, perfumes/cologne, or deodorant             Do not wear nail polish.  Do not shave  48 hours prior to surgery.     Do not bring valuables to the hospital. [place].   Contacts, dentures or bridgework may not be worn into surgery.   Bring small overnight bag day of surgery.     Please read over the following fact sheets you were given: IF YOU HAVE QUESTIONS ABOUT YOUR PRE OP INSTRUCTIONS PLEASE CALL  [facility] - Preparing for Surgery Before surgery, you can play an important role.  Because skin is not sterile, your skin needs to be as free of germs as possible.  You can reduce the number of germs on your skin by washing with CHG (chlorahexidine gluconate) soap before surgery.  CHG is an antiseptic cleaner which kills germs and bonds with the skin to continue killing germs even after washing. Please DO NOT use if you have an allergy to CHG or antibacterial soaps.  If your skin becomes reddened/irritated stop using the CHG and inform your nurse when you arrive at Short Stay. Do not shave (including legs and underarms) for at least 48 hours prior to the first CHG shower.  You may shave your face/neck.  Please follow these instructions carefully:  1.  Shower with CHG Soap the night before surgery and the  morning of surgery.  2.  If you choose to wash your hair, wash your hair first as usual with your normal  shampoo.  3.  After you shampoo, rinse your hair and body thoroughly to remove the shampoo.                             4.  Use CHG as you would any other liquid soap.  You can  apply chg directly to the skin and wash.  Gently with a scrungie or clean washcloth.  5.  Apply the CHG Soap to your body ONLY FROM THE NECK DOWN.   Do   not use on face/ open                           Wound or open sores. Avoid contact with eyes, ears mouth and   genitals (private parts).                       Wash face,  Genitals (private parts) with your normal soap.             6.  Wash thoroughly, paying special attention to the area where your    surgery  will be performed.  7.  Thoroughly rinse your body with warm water from the neck down.  8.  DO NOT shower/wash with your normal  soap after using and rinsing off the CHG Soap.                9.  Pat yourself dry with a clean towel.            10.  Wear clean pajamas.            11.  Place clean sheets on your bed the night of your first shower and do not  sleep with pets. Day of Surgery : Do not apply any lotions/deodorants the morning of surgery.  Please wear clean clothes to the hospital/surgery center.  FAILURE TO FOLLOW THESE INSTRUCTIONS MAY RESULT IN THE CANCELLATION OF YOUR SURGERY  PATIENT SIGNATURE_________________________________  NURSE SIGNATURE__________________________________  ________________________________________________________________________

## 2021-02-06 NOTE — Progress Notes (Addendum)
COVID Vaccine Completed: x2 Date COVID Vaccine completed:  2nd June 2021 Has received booster: COVID vaccine manufacturer: [employer]   Date of COVID positive in last 90 days:  N/A  PCP - Maurice Small, MD Cardiologist - Dr. Adrian Prows, MD  Chest x-ray - N/A EKG - N/A Stress Test - 11-27-18 Epic ECHO - Not done per Dr. Irven Shelling office Cardiac Cath - N/A Pacemaker/ICD device last checked: Spinal Cord Stimulator:  Sleep Study - 10+ years ago, pos sleep apnea CPAP - No  Fasting Blood Sugar - N/A Checks Blood Sugar _____ times a day  Blood Thinner Instructions:  N/A spirin Instructions: Last Dose:  Activity level:  Can go up a flight of stairs and perform activities of daily living without stopping and without symptoms of chest pain or shortness of breath.  Able to exercise without symptoms  Anesthesia review:  Cardiac eval 2020 for palpitations and dyspnea.  Pt states no recent palpitations or dyspnea.  Patient denies shortness of breath, fever, cough and chest pain at PAT appointment   Patient verbalized understanding of instructions that were given to them at the PAT appointment. Patient was also instructed that they will need to review over the PAT instructions again at home before surgery.

## 2021-02-10 ENCOUNTER — Encounter (HOSPITAL_COMMUNITY)
Admission: RE | Admit: 2021-02-10 | Discharge: 2021-02-10 | Disposition: A | Discharge status: Home / Self Care | Payer: 59 | Source: Ambulatory Visit | Attending: Surgery | Admitting: Surgery

## 2021-02-10 ENCOUNTER — Encounter (HOSPITAL_COMMUNITY): Payer: Self-pay

## 2021-02-10 ENCOUNTER — Other Ambulatory Visit: Payer: Self-pay

## 2021-02-10 DIAGNOSIS — Z01812 Encounter for preprocedural laboratory examination: Secondary | ICD-10-CM | POA: Insufficient documentation

## 2021-02-10 HISTORY — DX: Deficiency of other specified B group vitamins: E53.8

## 2021-02-10 HISTORY — DX: Gastro-esophageal reflux disease without esophagitis: K21.9

## 2021-02-10 HISTORY — DX: Nontoxic single thyroid nodule: E04.1

## 2021-02-10 HISTORY — DX: COVID-19: U07.1

## 2021-02-10 HISTORY — DX: Polycystic ovarian syndrome: E28.2

## 2021-02-10 HISTORY — DX: Unspecified intestinal obstruction, unspecified as to partial versus complete obstruction: K56.609

## 2021-02-10 HISTORY — DX: Unspecified malignant neoplasm of skin, unspecified: C44.90

## 2021-02-10 LAB — BASIC METABOLIC PANEL
Anion gap: 8 (ref 5–15)
BUN: 6 mg/dL (ref 6–20)
CO2: 24 mmol/L (ref 22–32)
Calcium: 9.4 mg/dL (ref 8.9–10.3)
Chloride: 106 mmol/L (ref 98–111)
Creatinine, Ser: 0.54 mg/dL (ref 0.44–1.00)
GFR, Estimated: >60 mL/min (ref >60)
Glucose, Bld: 77 mg/dL (ref 70–99)
Potassium: 4.2 mmol/L (ref 3.5–5.1)
Sodium: 138 mmol/L (ref 135–145)

## 2021-02-10 LAB — CBC
HCT: 40 % (ref 36.0–46.0)
Hemoglobin: 13 g/dL (ref 12.0–15.0)
MCH: 30.6 pg (ref 26.0–34.0)
MCHC: 32.5 g/dL (ref 30.0–36.0)
MCV: 94.1 fL (ref 80.0–100.0)
Platelets: 301 10*3/uL (ref 150–400)
RBC: 4.25 MIL/uL (ref 3.87–5.11)
RDW: 12.9 % (ref 11.5–15.5)
WBC: 8 10*3/uL (ref 4.0–10.5)
nRBC: 0 % (ref 0.0–0.2)

## 2021-02-10 NOTE — Telephone Encounter (Signed)
Regarding: Lab results  ----- Message from Angelia Mould, CMA sent at 02/10/2021  9:08 AM PDT -----       ----- Message sent from Angelia Mould, CMA to Avanell Shackleton L at 02/06/2021 12:33 PM -----   Kerry Allen would like to see you back to go over recent lab results. Is there a day of the week or time of day that works best for you to schedule?    Claretha Cooper, CCMA

## 2021-02-10 NOTE — Telephone Encounter (Signed)
Called patient [phone]), received her VM. LMTCB to schedule.

## 2021-02-10 NOTE — Telephone Encounter (Signed)
Called patient and left a voicemail. Will document updates in telephone encounter.

## 2021-02-10 NOTE — Telephone Encounter (Signed)
Patient has not yet read MyChart message. Please call patient to schedule.

## 2021-02-13 NOTE — Telephone Encounter (Signed)
Called patient, spoke with her directly. Informed her of the message below. Patient verbalized understanding. Scheduled patient for follow up 03/02/2021 at 3:30 PM.

## 2021-02-17 ENCOUNTER — Other Ambulatory Visit: Payer: Self-pay

## 2021-02-17 ENCOUNTER — Ambulatory Visit (HOSPITAL_COMMUNITY)
Admission: EM | Admit: 2021-02-17 | Discharge: 2021-02-17 | Disposition: A | Discharge status: Home / Self Care | Payer: 59 | Attending: Internal Medicine | Admitting: Internal Medicine

## 2021-02-17 ENCOUNTER — Other Ambulatory Visit (HOSPITAL_COMMUNITY): Payer: 59

## 2021-02-17 DIAGNOSIS — Z20822 Contact with and (suspected) exposure to covid-19: Secondary | ICD-10-CM | POA: Diagnosis not present

## 2021-02-17 DIAGNOSIS — Z1152 Encounter for screening for COVID-19: Secondary | ICD-10-CM | POA: Diagnosis not present

## 2021-02-17 NOTE — ED Triage Notes (Signed)
Pt presents for covid testing with no known symptoms pre procedure.

## 2021-02-18 ENCOUNTER — Encounter (HOSPITAL_COMMUNITY): Payer: Self-pay | Admitting: Surgery

## 2021-02-18 DIAGNOSIS — D440 Neoplasm of uncertain behavior of thyroid gland: Secondary | ICD-10-CM | POA: Diagnosis present

## 2021-02-18 LAB — SARS CORONAVIRUS 2 (TAT 6-24 HRS): SARS Coronavirus 2: NEGATIVE

## 2021-02-18 NOTE — H&P (Signed)
General Surgery [facility]  Margaret Miller DOB: 24-May-1976 Married / Language: English / Race: White Female   History of Present Illness   The patient is a 45 year old female who presents with a thyroid nodule.  CHIEF COMPLAINT: thyroid neoplasm of uncertain behavior  Patient is referred by Dr. Maurice Small for surgical evaluation and management of a newly diagnosed thyroid neoplasm of uncertain behavior. Patient had noted an area of discomfort in the right neck approximately 4-5 weeks ago. She also had noted weight loss over the past 2 months which seemed to be unintentional. Laboratory studies showed a suppressed TSH level of 0.04. Her long-standing dosage of levothyroxine was decreased from 100 g to 88 g. Patient underwent an ultrasound examination on December 12, 2020. This showed a normal size thyroid gland which was mildly heterogeneous. Patient was noted to have a dominant nodule in the right superior pole measuring 2.4 cm. This was felt to be moderately suspicious and biopsy was recommended. Patient underwent fine-needle aspiration biopsy on December 21, 2020. Cytopathology showed atypia of undetermined significance, Bethesda category III. A sample was sent for molecular genetic testing, AFIRMA, and returned with the result of suspicious, rendering a risk of malignancy of 50%. Patient is now referred for consideration for surgical resection for definitive diagnosis and management. Patient has had no prior head or neck surgery. She does have a long-standing history of hypothyroidism supposedly due to Hashimoto's thyroiditis. Her sister has also been diagnosed with Hashimoto's thyroiditis. There is no family history of thyroid malignancy.   Past Surgical History  Gallbladder Surgery - Laparoscopic  Gastric Bypass  Tonsillectomy  Ventral / Umbilical Hernia Surgery  Right.  Diagnostic Studies History  Colonoscopy  never Mammogram  1-3 years  ago Pap Smear  1-5 years ago  Allergies  No Known Drug Allergies  Allergies Reconciled   Medication History  Levothyroxine Sodium (88MCG Tablet, Oral) Active. Emgality (120MG /ML Soln Auto-inj, Subcutaneous) Active. Roselyn Meier (100MG  Tablet, Oral) Active. Medications Reconciled  Social History  Alcohol use  Occasional alcohol use. Caffeine use  Tea. No drug use  Tobacco use  Never smoker.  Family History  Hypertension  Father. Melanoma  Mother. Migraine Headache  Mother, Sister. Thyroid problems  Father, Mother, Sister.  Pregnancy / Birth History  Age at menarche  64 years. Contraceptive History  Oral contraceptives. Gravida  3 Irregular periods  Length (months) of breastfeeding  >24 Maternal age  85-40 Para  1  Other Problems  Asthma  Cholelithiasis  Gastroesophageal Reflux Disease  Heart murmur  Migraine Headache  Other disease, cancer, significant illness  Thyroid Disease  Ventral Hernia Repair   Review of Systems General Present- Weight Loss. Not Present- Appetite Loss, Chills, Fatigue, Fever, Night Sweats and Weight Gain. Skin Not Present- Change in Wart/Mole, Dryness, Hives, Jaundice, New Lesions, Non-Healing Wounds, Rash and Ulcer. HEENT Present- Seasonal Allergies. Not Present- Earache, Hearing Loss, Hoarseness, Nose Bleed, Oral Ulcers, Ringing in the Ears, Sinus Pain, Sore Throat, Visual Disturbances, Wears glasses/contact lenses and Yellow Eyes. Respiratory Not Present- Bloody sputum, Chronic Cough, Difficulty Breathing, Snoring and Wheezing. Breast Not Present- Breast Mass, Breast Pain, Nipple Discharge and Skin Changes. Cardiovascular Not Present- Chest Pain, Difficulty Breathing Lying Down, Leg Cramps, Palpitations, Rapid Heart Rate, Shortness of Breath and Swelling of Extremities. Gastrointestinal Not Present- Abdominal Pain, Bloating, Bloody Stool, Change in Bowel Habits, Chronic diarrhea, Constipation, Difficulty Swallowing,  Excessive gas, Gets full quickly at meals, Hemorrhoids, Indigestion, Nausea, Rectal Pain and Vomiting. Female Genitourinary  Not Present- Frequency, Nocturia, Painful Urination, Pelvic Pain and Urgency. Musculoskeletal Not Present- Back Pain, Joint Pain, Joint Stiffness, Muscle Pain, Muscle Weakness and Swelling of Extremities. Neurological Present- Headaches. Not Present- Decreased Memory, Fainting, Numbness, Seizures, Tingling, Tremor, Trouble walking and Weakness. Psychiatric Not Present- Anxiety, Bipolar, Change in Sleep Pattern, Depression, Fearful and Frequent crying. Endocrine Not Present- Cold Intolerance, Excessive Hunger, Hair Changes, Heat Intolerance, Hot flashes and New Diabetes. Hematology Not Present- Blood Thinners, Easy Bruising, Excessive bleeding, Gland problems, HIV and Persistent Infections.   Physical Exam  GENERAL APPEARANCE Development: normal Nutritional status: normal Gross deformities: none  SKIN Rash, lesions, ulcers: none Induration, erythema: none Nodules: none palpable  EYES Conjunctiva and lids: normal Pupils: equal and reactive Iris: normal bilaterally  EARS, NOSE, MOUTH, THROAT External ears: no lesion or deformity External nose: no lesion or deformity Hearing: grossly normal Due to Covid-19 pandemic, patient is wearing a mask.  NECK Symmetric: yes Trachea: midline Thyroid: Palpation of the right thyroid lobe shows it to be normal in size. With swallowing maneuver there is a suggestion of a nodule in the right lobe but certainly no dominant masses palpable. There is no significant tenderness. Left lobe is normal to palpation. There is no associated lymphadenopathy.  CHEST Respiratory effort: normal Retraction or accessory muscle use: no Breath sounds: normal bilaterally Rales, rhonchi, wheeze: none  CARDIOVASCULAR Auscultation: regular rhythm, normal rate Murmurs: none Pulses: radial pulse 2+ palpable Lower extremity edema:  none  MUSCULOSKELETAL Station and gait: normal Digits and nails: no clubbing or cyanosis Muscle strength: grossly normal all extremities Range of motion: grossly normal all extremities Deformity: none  LYMPHATIC Cervical: none palpable Supraclavicular: none palpable  PSYCHIATRIC Oriented to person, place, and time: yes Mood and affect: normal for situation Judgment and insight: appropriate for situation    Assessment & Plan   NEOPLASM OF UNCERTAIN BEHAVIOR OF THYROID GLAND (D44.0)  Patient is referred by her primary care physician for surgical evaluation and management of a thyroid neoplasm of uncertain behavior.  Patient provided with a copy of "The Thyroid Book: Medical and Surgical Treatment of Thyroid Problems", published by [employer], 16 pages. Book reviewed and explained to patient during visit today.  Patient has a 2.4 cm nodule in the right superior pole. Molecular genetic testing shows this to be suspicious with a 50% risk of malignancy. We discussed the indications for right thyroid lobectomy versus total thyroidectomy for definitive diagnosis and management. I have recommended right thyroid lobectomy. We discussed the pros and cons of this approach. We discussed the risk of surgery including the risk of recurrent laryngeal nerve injury and injury to parathyroid glands. We discussed the hospital stay to be anticipated. We discussed the size and location of the surgical incision. We discussed her postoperative recovery and return to work and activities. We discussed the need for lifelong thyroid hormone replacement. We discussed the possible need for additional surgery and the possible need for radioactive iodine treatment. Patient understands and wishes to proceed in the near future.  Patient has an appointment to see endocrinology, Dr. Philemon Kingdom, in consultation in the near future. She may wish to see her endocrinologist prior to deciding for surgical  intervention.  The risks and benefits of the procedure have been discussed at length with the patient. The patient understands the proposed procedure, potential alternative treatments, and the course of recovery to be expected. All of the patient's questions have been answered at this time. The patient wishes to proceed with surgery.  Sherren Mocha  Harlow Asa, [facility] Office: [phone]

## 2021-02-20 ENCOUNTER — Ambulatory Visit (HOSPITAL_COMMUNITY): Payer: 59 | Admitting: Physician Assistant

## 2021-02-20 ENCOUNTER — Other Ambulatory Visit: Payer: Self-pay

## 2021-02-20 ENCOUNTER — Ambulatory Visit (HOSPITAL_COMMUNITY): Payer: 59 | Admitting: Certified Registered"

## 2021-02-20 ENCOUNTER — Ambulatory Visit (HOSPITAL_COMMUNITY)
Admission: RE | Admit: 2021-02-20 | Discharge: 2021-02-21 | Disposition: A | Discharge status: Home / Self Care | Payer: 59 | Attending: Surgery | Admitting: Surgery

## 2021-02-20 ENCOUNTER — Encounter (HOSPITAL_COMMUNITY)
Admission: RE | Disposition: A | Discharge status: Home / Self Care | Payer: Self-pay | Source: Home / Self Care | Attending: Surgery

## 2021-02-20 ENCOUNTER — Encounter (HOSPITAL_COMMUNITY): Payer: Self-pay | Admitting: Surgery

## 2021-02-20 DIAGNOSIS — Z7984 Long term (current) use of oral hypoglycemic drugs: Secondary | ICD-10-CM | POA: Diagnosis not present

## 2021-02-20 DIAGNOSIS — Z79890 Hormone replacement therapy (postmenopausal): Secondary | ICD-10-CM | POA: Insufficient documentation

## 2021-02-20 DIAGNOSIS — E063 Autoimmune thyroiditis: Secondary | ICD-10-CM | POA: Diagnosis not present

## 2021-02-20 DIAGNOSIS — E039 Hypothyroidism, unspecified: Secondary | ICD-10-CM | POA: Diagnosis not present

## 2021-02-20 DIAGNOSIS — Z885 Allergy status to narcotic agent status: Secondary | ICD-10-CM | POA: Insufficient documentation

## 2021-02-20 DIAGNOSIS — D34 Benign neoplasm of thyroid gland: Secondary | ICD-10-CM | POA: Insufficient documentation

## 2021-02-20 DIAGNOSIS — D440 Neoplasm of uncertain behavior of thyroid gland: Secondary | ICD-10-CM | POA: Diagnosis present

## 2021-02-20 DIAGNOSIS — Z79899 Other long term (current) drug therapy: Secondary | ICD-10-CM | POA: Insufficient documentation

## 2021-02-20 HISTORY — PX: THYROID LOBECTOMY: SHX420

## 2021-02-20 LAB — PREGNANCY, URINE: Preg Test, Ur: NEGATIVE

## 2021-02-20 SURGERY — LOBECTOMY, THYROID
Anesthesia: General | Laterality: Right

## 2021-02-20 MED ORDER — HYDROMORPHONE HCL 1 MG/ML IJ SOLN: 1.0000 mg | INTRAMUSCULAR | Status: DC | PRN

## 2021-02-20 MED ORDER — ACETAMINOPHEN 650 MG RE SUPP: 650.0000 mg | Freq: Four times a day (QID) | RECTAL | Status: DC | PRN

## 2021-02-20 MED ORDER — DEXAMETHASONE SODIUM PHOSPHATE 10 MG/ML IJ SOLN
INTRAMUSCULAR | Status: AC
  Filled 2021-02-20: qty 1

## 2021-02-20 MED ORDER — PROMETHAZINE HCL 25 MG/ML IJ SOLN
INTRAMUSCULAR | Status: AC
  Administered 2021-02-20: 6.25 mg via INTRAVENOUS
  Filled 2021-02-20: qty 1

## 2021-02-20 MED ORDER — ACETAMINOPHEN 325 MG PO TABS
650.0000 mg | ORAL_TABLET | Freq: Four times a day (QID) | ORAL | Status: DC | PRN
  Administered 2021-02-21: 650 mg via ORAL
  Filled 2021-02-20: qty 2

## 2021-02-20 MED ORDER — SODIUM CHLORIDE 0.45 % IV SOLN: INTRAVENOUS | Status: DC

## 2021-02-20 MED ORDER — LEVOTHYROXINE SODIUM 88 MCG PO TABS
88.0000 ug | ORAL_TABLET | Freq: Every day | ORAL | Status: DC
  Administered 2021-02-21: 88 ug via ORAL
  Filled 2021-02-20: qty 1

## 2021-02-20 MED ORDER — FENTANYL CITRATE (PF) 100 MCG/2ML IJ SOLN
INTRAMUSCULAR | Status: AC
  Filled 2021-02-20: qty 2

## 2021-02-20 MED ORDER — MIDAZOLAM HCL 2 MG/2ML IJ SOLN
INTRAMUSCULAR | Status: AC
  Filled 2021-02-20: qty 2

## 2021-02-20 MED ORDER — LIOTHYRONINE SODIUM 5 MCG PO TABS
5.0000 ug | ORAL_TABLET | Freq: Two times a day (BID) | ORAL | Status: DC
  Administered 2021-02-21: 5 ug via ORAL
  Filled 2021-02-20 (×3): qty 1

## 2021-02-20 MED ORDER — ONDANSETRON HCL 4 MG/2ML IJ SOLN
4.0000 mg | Freq: Four times a day (QID) | INTRAMUSCULAR | Status: DC | PRN
Start: 2021-02-20 — End: 2021-02-21
  Administered 2021-02-21: 4 mg via INTRAVENOUS
  Filled 2021-02-20: qty 2

## 2021-02-20 MED ORDER — ONDANSETRON 4 MG PO TBDP: 4.0000 mg | ORAL_TABLET | Freq: Four times a day (QID) | ORAL | Status: DC | PRN

## 2021-02-20 MED ORDER — TRAMADOL HCL 50 MG PO TABS
50.0000 mg | ORAL_TABLET | Freq: Four times a day (QID) | ORAL | Status: DC | PRN
  Administered 2021-02-20: 50 mg via ORAL
  Filled 2021-02-20 (×2): qty 1

## 2021-02-20 MED ORDER — ONDANSETRON HCL 4 MG/2ML IJ SOLN
INTRAMUSCULAR | Status: DC | PRN
  Administered 2021-02-20: 4 mg via INTRAVENOUS

## 2021-02-20 MED ORDER — CEFAZOLIN SODIUM-DEXTROSE 2-4 GM/100ML-% IV SOLN
2.0000 g | INTRAVENOUS | Status: AC
  Administered 2021-02-20: 2 g via INTRAVENOUS
  Filled 2021-02-20: qty 100

## 2021-02-20 MED ORDER — PROGESTERONE MICRONIZED 100 MG PO CAPS
400.0000 mg | ORAL_CAPSULE | Freq: Every day | ORAL | Status: DC
  Filled 2021-02-20 (×2): qty 4

## 2021-02-20 MED ORDER — 0.9 % SODIUM CHLORIDE (POUR BTL) OPTIME
TOPICAL | Status: DC | PRN
  Administered 2021-02-20: 1000 mL

## 2021-02-20 MED ORDER — DIPHENHYDRAMINE HCL 50 MG/ML IJ SOLN
INTRAMUSCULAR | Status: DC | PRN
  Administered 2021-02-20: 12.5 mg via INTRAVENOUS

## 2021-02-20 MED ORDER — HYDROCODONE-ACETAMINOPHEN 5-325 MG PO TABS
1.0000 | ORAL_TABLET | ORAL | Status: DC | PRN
  Administered 2021-02-20: 1.5 via ORAL
  Administered 2021-02-20: 2 via ORAL
  Administered 2021-02-20: 1 via ORAL
  Administered 2021-02-21: 2 via ORAL
  Filled 2021-02-20 (×3): qty 2
  Filled 2021-02-20: qty 1

## 2021-02-20 MED ORDER — ACETAMINOPHEN 500 MG PO TABS
1000.0000 mg | ORAL_TABLET | Freq: Once | ORAL | Status: AC
  Administered 2021-02-20: 1000 mg via ORAL
  Filled 2021-02-20: qty 2

## 2021-02-20 MED ORDER — DEXAMETHASONE SODIUM PHOSPHATE 10 MG/ML IJ SOLN
INTRAMUSCULAR | Status: DC | PRN
  Administered 2021-02-20: 8 mg via INTRAVENOUS

## 2021-02-20 MED ORDER — ONDANSETRON HCL 4 MG/2ML IJ SOLN
INTRAMUSCULAR | Status: AC
  Filled 2021-02-20: qty 2

## 2021-02-20 MED ORDER — ORAL CARE MOUTH RINSE: 15.0000 mL | Freq: Once | OROMUCOSAL | Status: AC

## 2021-02-20 MED ORDER — FENTANYL CITRATE (PF) 100 MCG/2ML IJ SOLN
INTRAMUSCULAR | Status: AC
  Administered 2021-02-20: 50 ug via INTRAVENOUS
  Filled 2021-02-20: qty 2

## 2021-02-20 MED ORDER — LIDOCAINE 2% (20 MG/ML) 5 ML SYRINGE
INTRAMUSCULAR | Status: AC
  Filled 2021-02-20: qty 5

## 2021-02-20 MED ORDER — ROCURONIUM BROMIDE 100 MG/10ML IV SOLN
INTRAVENOUS | Status: DC | PRN
  Administered 2021-02-20: 70 mg via INTRAVENOUS
  Administered 2021-02-20: 10 mg via INTRAVENOUS

## 2021-02-20 MED ORDER — ROCURONIUM BROMIDE 10 MG/ML (PF) SYRINGE
PREFILLED_SYRINGE | INTRAVENOUS | Status: AC
  Filled 2021-02-20: qty 10

## 2021-02-20 MED ORDER — PHENYLEPHRINE 40 MCG/ML (10ML) SYRINGE FOR IV PUSH (FOR BLOOD PRESSURE SUPPORT)
PREFILLED_SYRINGE | INTRAVENOUS | Status: AC
  Filled 2021-02-20: qty 10

## 2021-02-20 MED ORDER — CHLORHEXIDINE GLUCONATE CLOTH 2 % EX PADS: 6.0000 | MEDICATED_PAD | Freq: Once | CUTANEOUS | Status: DC

## 2021-02-20 MED ORDER — PROPOFOL 10 MG/ML IV BOLUS
INTRAVENOUS | Status: AC
  Filled 2021-02-20: qty 20

## 2021-02-20 MED ORDER — PROPOFOL 10 MG/ML IV BOLUS
INTRAVENOUS | Status: DC | PRN
  Administered 2021-02-20: 150 mg via INTRAVENOUS

## 2021-02-20 MED ORDER — SUGAMMADEX SODIUM 200 MG/2ML IV SOLN
INTRAVENOUS | Status: DC | PRN
  Administered 2021-02-20: 200 mg via INTRAVENOUS

## 2021-02-20 MED ORDER — CHLORHEXIDINE GLUCONATE 0.12 % MT SOLN
15.0000 mL | Freq: Once | OROMUCOSAL | Status: AC
  Administered 2021-02-20: 15 mL via OROMUCOSAL

## 2021-02-20 MED ORDER — FENTANYL CITRATE (PF) 100 MCG/2ML IJ SOLN
INTRAMUSCULAR | Status: DC | PRN
  Administered 2021-02-20 (×3): 50 ug via INTRAVENOUS
  Administered 2021-02-20: 150 ug via INTRAVENOUS

## 2021-02-20 MED ORDER — MELATONIN 3 MG PO TABS
6.0000 mg | ORAL_TABLET | Freq: Every day | ORAL | Status: DC
  Administered 2021-02-20: 6 mg via ORAL
  Filled 2021-02-20: qty 2

## 2021-02-20 MED ORDER — METFORMIN HCL 850 MG PO TABS
850.0000 mg | ORAL_TABLET | Freq: Three times a day (TID) | ORAL | Status: DC
  Administered 2021-02-21: 850 mg via ORAL
  Filled 2021-02-20 (×4): qty 1

## 2021-02-20 MED ORDER — LACTATED RINGERS IV SOLN: INTRAVENOUS | Status: DC

## 2021-02-20 MED ORDER — PROMETHAZINE HCL 25 MG/ML IJ SOLN: 6.2500 mg | Freq: Once | INTRAMUSCULAR | Status: AC

## 2021-02-20 MED ORDER — FENTANYL CITRATE (PF) 100 MCG/2ML IJ SOLN
25.0000 ug | INTRAMUSCULAR | Status: DC | PRN
  Administered 2021-02-20: 50 ug via INTRAVENOUS

## 2021-02-20 MED ORDER — LIDOCAINE HCL (CARDIAC) PF 100 MG/5ML IV SOSY
PREFILLED_SYRINGE | INTRAVENOUS | Status: DC | PRN
  Administered 2021-02-20: 60 mg via INTRAVENOUS

## 2021-02-20 SURGICAL SUPPLY — 29 items
ATTRACTOMAT 16X20 MAGNETIC DRP (DRAPES) ×2 IMPLANT
BLADE SURG 15 STRL LF DISP TIS (BLADE) ×1 IMPLANT
BLADE SURG 15 STRL SS (BLADE) ×1
CHLORAPREP W/TINT 26 (MISCELLANEOUS) ×2 IMPLANT
CLIP VESOCCLUDE MED 6/CT (CLIP) ×6 IMPLANT
CLIP VESOCCLUDE SM WIDE 6/CT (CLIP) ×4 IMPLANT
COVER SURGICAL LIGHT HANDLE (MISCELLANEOUS) ×2 IMPLANT
COVER WAND RF STERILE (DRAPES) ×2 IMPLANT
DERMABOND ADVANCED (GAUZE/BANDAGES/DRESSINGS) ×1
DERMABOND ADVANCED .7 DNX12 (GAUZE/BANDAGES/DRESSINGS) ×1 IMPLANT
DRAPE LAPAROTOMY T 98X78 PEDS (DRAPES) ×2 IMPLANT
DRAPE UTILITY XL STRL (DRAPES) ×2 IMPLANT
ELECT PENCIL ROCKER SW 15FT (MISCELLANEOUS) ×2 IMPLANT
ELECT REM PT RETURN 15FT ADLT (MISCELLANEOUS) ×2 IMPLANT
GAUZE 4X4 16PLY RFD (DISPOSABLE) ×2 IMPLANT
GLOVE SURG ORTHO LTX SZ8 (GLOVE) ×2 IMPLANT
GOWN STRL REUS W/TWL XL LVL3 (GOWN DISPOSABLE) ×4 IMPLANT
HEMOSTAT SURGICEL 2X4 FIBR (HEMOSTASIS) ×2 IMPLANT
ILLUMINATOR WAVEGUIDE N/F (MISCELLANEOUS) ×2 IMPLANT
KIT BASIN OR (CUSTOM PROCEDURE TRAY) ×2 IMPLANT
KIT TURNOVER KIT A (KITS) ×2 IMPLANT
PACK BASIC VI WITH GOWN DISP (CUSTOM PROCEDURE TRAY) ×2 IMPLANT
SHEARS HARMONIC 9CM CVD (BLADE) ×2 IMPLANT
SUT MNCRL AB 4-0 PS2 18 (SUTURE) ×2 IMPLANT
SUT VIC AB 3-0 SH 18 (SUTURE) ×4 IMPLANT
SYR BULB IRRIG 60ML STRL (SYRINGE) ×2 IMPLANT
TOWEL OR 17X26 10 PK STRL BLUE (TOWEL DISPOSABLE) ×2 IMPLANT
TOWEL OR NON WOVEN STRL DISP B (DISPOSABLE) ×2 IMPLANT
TUBING CONNECTING 10 (TUBING) ×2 IMPLANT

## 2021-02-20 NOTE — Op Note (Signed)
Procedure Note  Pre-operative Diagnosis:  Thyroid neoplasm of uncertain behavior, right thyroid nodule  Post-operative Diagnosis:  same  Surgeon:  Armandina Gemma, MD  Assistant:  Judyann Munson, RNFA   Procedure:  Right thyroid lobectomy  Anesthesia:  General  Estimated Blood Loss:  minimal  Drains: none         Specimen: thyroid lobe to pathology  Indications:  Patient is referred by Dr. Maurice Small for surgical evaluation and management of a newly diagnosed thyroid neoplasm of uncertain behavior. Patient had noted an area of discomfort in the right neck approximately 4-5 weeks ago. She also had noted weight loss over the past 2 months which seemed to be unintentional. Laboratory studies showed a suppressed TSH level of 0.04. Her long-standing dosage of levothyroxine was decreased from 100 g to 88 g. Patient underwent an ultrasound examination on December 12, 2020. This showed a normal size thyroid gland which was mildly heterogeneous. Patient was noted to have a dominant nodule in the right superior pole measuring 2.4 cm. This was felt to be moderately suspicious and biopsy was recommended. Patient underwent fine-needle aspiration biopsy on December 21, 2020. Cytopathology showed atypia of undetermined significance, Bethesda category III. A sample was sent for molecular genetic testing, AFIRMA, and returned with the result of suspicious, rendering a risk of malignancy of 50%. Patient is now referred for consideration for surgical resection for definitive diagnosis and management.   Procedure Details: Procedure was done in OR #5 at the [facility]. The patient was brought to the operating room and placed in a supine position on the operating room table. Following administration of general anesthesia, the patient was positioned and then prepped and draped in the usual aseptic fashion. After ascertaining that an adequate level of anesthesia had been achieved, a small Kocher  incision was made with #15 blade. Dissection was carried through subcutaneous tissues and platysma. Hemostasis was achieved with the electrocautery. Skin flaps were elevated cephalad and caudad from the thyroid notch to the sternal notch. A self-retaining retractor was placed for exposure. Strap muscles were incised in the midline and dissection was begun on the right side. Strap muscles were reflected laterally. The right thyroid lobe was small, firm, and without significant nodule to palpation. The lobe was gently mobilized with blunt dissection. Superior pole vessels were dissected out and divided individually between small and medium ligaclips with the harmonic scalpel. The thyroid lobe was rolled anteriorly. Branches of the inferior thyroid artery were divided between small ligaclips with the harmonic scalpel. Inferior venous tributaries were divided between ligaclips. Both the superior and inferior parathyroid glands were identified and preserved on their vascular pedicles. The recurrent laryngeal nerve was identified and preserved along its course. The ligament of Gwenlyn Found was released with the electrocautery and the gland was mobilized onto the anterior trachea. Isthmus was mobilized across the midline. There was no significant pyramidal lobe present. The thyroid parenchyma was transected at the junction of the isthmus and contralateral thyroid lobe with the harmonic scalpel. The thyroid lobe and isthmus were submitted to pathology for review.  The neck was irrigated with warm saline. Fibrillar was placed throughout the operative field. Strap muscles were approximated in the midline with interrupted 3-0 Vicryl sutures. Platysma was closed with interrupted 3-0 Vicryl sutures. Skin was closed with a running 4-0 Monocryl subcuticular suture.  Wound was washed and dried and Dermabond was applied. The patient was awakened from anesthesia and brought to the recovery room. The patient tolerated the procedure  well.   Armandina Gemma, MD [facility] Office: [phone]

## 2021-02-20 NOTE — Anesthesia Postprocedure Evaluation (Signed)
Anesthesia Post Note  Patient: Margaret Miller  Procedure(s) Performed: RIGHT THYROID LOBECTOMY (Right )     Patient location during evaluation: PACU Anesthesia Type: General Level of consciousness: awake and alert Pain management: pain level controlled Vital Signs Assessment: post-procedure vital signs reviewed and stable Respiratory status: spontaneous breathing, nonlabored ventilation, respiratory function stable and patient connected to nasal cannula oxygen Cardiovascular status: blood pressure returned to baseline and stable Postop Assessment: no apparent nausea or vomiting Anesthetic complications: no   No complications documented.  Last Vitals:  Vitals:   02/20/21 1457 02/20/21 1703  BP: 123/74 120/74  Pulse: 73 62  Resp: 16 16  Temp: 36.9 C 36.5 C  SpO2: 98% 98%    Last Pain:  Vitals:   02/20/21 1350  TempSrc:   PainSc: 5    Pain Goal:                   Itxel Wickard L Tove Wideman

## 2021-02-20 NOTE — Anesthesia Procedure Notes (Signed)
Procedure Name: Intubation Date/Time: 02/20/2021 9:15 AM Performed by: Elaina Pattee, CRNA Pre-anesthesia Checklist: Patient identified, Emergency Drugs available, Suction available, Patient being monitored and Timeout performed Patient Re-evaluated:Patient Re-evaluated prior to induction Oxygen Delivery Method: Circle system utilized Preoxygenation: Pre-oxygenation with 100% oxygen Induction Type: IV induction Ventilation: Mask ventilation without difficulty Laryngoscope Size: 3 Grade View: Grade I Tube type: Oral Tube size: 7.0 mm Number of attempts: 1 Airway Equipment and Method: Patient positioned with wedge pillow Placement Confirmation: ETT inserted through vocal cords under direct vision,  positive ETCO2 and breath sounds checked- equal and bilateral Secured at: 20 cm Tube secured with: Tape Dental Injury: Teeth and Oropharynx as per pre-operative assessment

## 2021-02-20 NOTE — Interval H&P Note (Signed)
History and Physical Interval Note:  02/20/2021 8:49 AM  Margaret Miller  has presented today for surgery, with the diagnosis of THYROID NEOPLASM OF UNCERTAIN BEHAVIOR.  The various methods of treatment have been discussed with the patient and family. After consideration of risks, benefits and other options for treatment, the patient has consented to    Procedure(s): RIGHT THYROID LOBECTOMY (Right) as a surgical intervention.    The patient's history has been reviewed, patient examined, no change in status, stable for surgery.  I have reviewed the patient's chart and labs.  Questions were answered to the patient's satisfaction.    Armandina Gemma, MD [facility] Office: [email or web address]

## 2021-02-20 NOTE — Transfer of Care (Signed)
Immediate Anesthesia Transfer of Care Note  Patient: Margaret Miller  Procedure(s) Performed: RIGHT THYROID LOBECTOMY (Right )  Patient Location: PACU  Anesthesia Type:General  Level of Consciousness: awake, alert  and oriented  Airway & Oxygen Therapy: Patient Spontanous Breathing  Post-op Assessment: Report given to RN and Post -op Vital signs reviewed and stable  Post vital signs: Reviewed and stable  Last Vitals:  Vitals Value Taken Time  BP 123/71 02/20/21 1040  Temp    Pulse 88 02/20/21 1042  Resp 15 02/20/21 1042  SpO2 99 % 02/20/21 1042  Vitals shown include unvalidated device data.  Last Pain:  Vitals:   02/20/21 0816  TempSrc:   PainSc: 0-No pain         Complications: No complications documented.

## 2021-02-20 NOTE — Anesthesia Preprocedure Evaluation (Addendum)
Anesthesia Evaluation  Patient identified by MRN, date of birth, ID band Patient awake    Reviewed: Allergy & Precautions, NPO status , Patient's Chart, lab work & pertinent test results  Airway Mallampati: I  TM Distance: >3 FB Neck ROM: Full    Dental  (+) Chipped, Dental Advisory Given,    Pulmonary shortness of breath,    Pulmonary exam normal breath sounds clear to auscultation       Cardiovascular negative cardio ROS Normal cardiovascular exam Rhythm:Regular Rate:Normal  Stress Test 2020 negative   Neuro/Psych  Headaches, negative psych ROS   GI/Hepatic Neg liver ROS, GERD  Controlled and Medicated,  Endo/Other  Hypothyroidism PCOS on metformin  Renal/GU negative Renal ROS  negative genitourinary   Musculoskeletal negative musculoskeletal ROS (+)   Abdominal   Peds  Hematology negative hematology ROS (+)   Anesthesia Other Findings Thyroid nodule  Reproductive/Obstetrics                            Anesthesia Physical Anesthesia Plan  ASA: II  Anesthesia Plan: General   Post-op Pain Management:    Induction: Intravenous  PONV Risk Score and Plan: 3 and Midazolam, Dexamethasone and Ondansetron  Airway Management Planned: Oral ETT  Additional Equipment:   Intra-op Plan:   Post-operative Plan: Extubation in OR  Informed Consent: I have reviewed the patients History and Physical, chart, labs and discussed the procedure including the risks, benefits and alternatives for the proposed anesthesia with the patient or authorized representative who has indicated his/her understanding and acceptance.     Dental advisory given  Plan Discussed with: CRNA  Anesthesia Plan Comments:         Anesthesia Quick Evaluation

## 2021-02-21 ENCOUNTER — Encounter (HOSPITAL_COMMUNITY): Payer: Self-pay | Admitting: Surgery

## 2021-02-21 DIAGNOSIS — D34 Benign neoplasm of thyroid gland: Secondary | ICD-10-CM | POA: Diagnosis not present

## 2021-02-21 LAB — SURGICAL PATHOLOGY

## 2021-02-21 NOTE — Discharge Summary (Signed)
Physician Discharge Summary [facility]  Patient ID: Margaret Miller MRN: [id number] DOB/AGE: [birth year] 45 y.o.  Admit date: 02/20/2021  Discharge date: 02/21/2021  Discharge Diagnoses:  Principal Problem:   Neoplasm of uncertain behavior of thyroid gland   Discharged Condition: good  Hospital Course: Patient was admitted for observation following thyroid surgery.  Post op course was uncomplicated.  Pain was well controlled.  Tolerated diet.  Patient was prepared for discharge home on POD#1.  Consults: None  Treatments: surgery: right thyroid lobectomy  Discharge Exam: Blood pressure 114/67, pulse 64, temperature 98 F (36.7 C), temperature source Oral, resp. rate 18, height 5' 2.01" (1.575 m), weight 68 kg, last menstrual period 01/12/2021, SpO2 100 %, unknown if currently breastfeeding. HEENT - clear Neck - wound dry and intact; mild STS; voice normal Chest - clear bilaterally Cor - RRR  Disposition: Home  Discharge Instructions    Diet - low sodium heart healthy   Complete by: As directed    Discharge instructions   Complete by: As directed    [facility]  THYROID & PARATHYROID SURGERY:  POST-OP INSTRUCTIONS  Always review your discharge instruction sheet from the facility where your surgery was performed.  A prescription for pain medication may be given to you upon discharge.  Take your pain medication as prescribed.  If narcotic pain medicine is not needed, then you may take acetaminophen (Tylenol) or ibuprofen (Advil) as needed.  Take your usually prescribed medications unless otherwise directed.  If you need a refill on your pain medication, please contact our office during regular business hours.  Prescriptions cannot be processed by our office after 5 pm or on weekends.  Start with a light diet upon arrival home, such as soup and crackers or toast.  Be sure to drink plenty of fluids daily.  Resume your normal diet the  day after surgery.  Most patients will experience some swelling and bruising on the chest and neck area.  Ice packs will help.  Swelling and bruising can take several days to resolve.   It is common to experience some constipation after surgery.  Increasing fluid intake and taking a stool softener (Colace) will usually help or prevent this problem.  A mild laxative (Milk of Magnesia or Miralax) should be taken according to package directions if there has been no bowel movement after 48 hours.  You have steri-strips and a gauze dressing over your incision.  You may remove the gauze bandage on the second day after surgery, and you may shower at that time.  Leave your steri-strips (small skin tapes) in place directly over the incision.  These strips should remain on the skin for 5-7 days and then be removed.  You may get them wet in the shower and pat them dry.  You may resume regular (light) daily activities beginning the next day (such as daily self-care, walking, climbing stairs) gradually increasing activities as tolerated.  You may have sexual intercourse when it is comfortable.  Refrain from any heavy lifting or straining until approved by your doctor.  You may drive when you no longer are taking prescription pain medication, you can comfortably wear a seatbelt, and you can safely maneuver your car and apply brakes.  You should see your doctor in the office for a follow-up appointment approximately three weeks after your surgery.  Make sure that you call for this appointment within a day or two after you arrive home to insure a convenient  appointment time.  WHEN TO CALL YOUR DOCTOR: -- Fever greater than 101.5 -- Inability to urinate -- Nausea and/or vomiting - persistent -- Extreme swelling or bruising -- Continued bleeding from incision -- Increased pain, redness, or drainage from the incision -- Difficulty swallowing or breathing -- Muscle cramping or spasms -- Numbness or tingling in  hands or around lips  The clinic staff is available to answer your questions during regular business hours.  Please don't hesitate to call and ask to speak to one of the nurses if you have concerns.  Armandina Gemma, MD [facility] Office: [phone]   Increase activity slowly   Complete by: As directed    No dressing needed   Complete by: As directed      Allergies as of 02/21/2021      Reactions   Percocet [oxycodone-acetaminophen] Other (See Comments)   Pt states " it cases migraines "       Medication List    TAKE these medications   acetaminophen 500 MG tablet Commonly known as: TYLENOL Take 1,000 mg by mouth every 6 (six) hours as needed for mild pain.   ASHWAGANDHA PO Take 300 mg by mouth daily.   B-12 5000 MCG Tbdp Take 5,000 mcg by mouth daily.   CAL-CITRATE PO Take 1,000 mg by mouth daily.   Galcanezumab-gnlm 120 MG/ML Soaj Commonly known as: Emgality Inject 120 mg into the skin every 30 (thirty) days.   levothyroxine 88 MCG tablet Commonly known as: SYNTHROID Take 88 mcg by mouth daily before breakfast.   liothyronine 5 MCG tablet Commonly known as: CYTOMEL Take 5 mcg by mouth in the morning and at bedtime.   magnesium oxide 400 MG tablet Commonly known as: MAG-OX Take 400 mg by mouth daily.   melatonin 3 MG Tabs tablet Take 6 mg by mouth at bedtime.   metFORMIN 850 MG tablet Commonly known as: GLUCOPHAGE Take 850 mg by mouth in the morning, at noon, and at bedtime.   PROBIOTIC DAILY PO Take 1 capsule by mouth daily.   progesterone 100 MG capsule Commonly known as: PROMETRIUM Take 400 mg by mouth daily.   Ubrelvy 100 MG Tabs Generic drug: Ubrogepant Take 100 mg by mouth daily as needed (migraines).   VITAMIN B-2 PO Take 400 mg by mouth daily.   vitamin C 1000 MG tablet Take 1,000 mg by mouth daily.   Vitamin D3 75 MCG (3000 UT) Tabs Take 3,000 Units by mouth daily.   VITAMIN K2 PO Take 150 mcg by mouth daily.    zinc gluconate 50 MG tablet Take 50 mg by mouth once a week.            Discharge Care Instructions  (From admission, onward)         Start     Ordered   02/21/21 0000  No dressing needed        02/21/21 9373          Follow-up Information    Armandina Gemma, MD. Schedule an appointment as soon as possible for a visit in 3 week(s).   Specialty: General Surgery Contact information: [address] [place] [address] [phone]               Armandina Gemma, [facility] Office: [phone]   Signed: Armandina Gemma 02/21/2021, 9:55 AM

## 2021-02-21 NOTE — Plan of Care (Signed)
Patient discharged home in stable condition 

## 2021-02-21 NOTE — Progress Notes (Signed)
Please contact patient and notify of benign pathology results.  Firman Petrow M. Haron Beilke, MD, FACS [facility] Office: [phone]   

## 2021-02-22 NOTE — Telephone Encounter (Signed)
Patient is requesting a refill of Adderall, Cytomel and Progesterone. Okay to fill?

## 2021-02-23 MED ORDER — progesterone (PROMETRIUM) 100 MG capsule
100 | ORAL_CAPSULE | Freq: Every day | ORAL | 2 refills | 90.00000 days | Status: DC
Start: 2021-02-23 — End: 2021-07-05
  Filled 2021-02-27: qty 30, 30d supply, fill #0

## 2021-02-23 MED ORDER — liothyronine (CYTOMEL) 5 MCG tablet
5 | ORAL_TABLET | Freq: Every day | ORAL | 2 refills | 90.00000 days | Status: DC
Start: 2021-02-23 — End: 2021-06-22
  Filled 2021-02-27: qty 30, 30d supply, fill #0

## 2021-02-23 MED ORDER — dextroamphetamine-amphetamine ER (ADDERALL XR) 20 MG 24 hr capsule
20 | ORAL_CAPSULE | Freq: Every morning | ORAL | 0 refills | Status: DC
Start: 2021-02-23 — End: 2021-04-06
  Filled 2021-02-27: qty 28, 28d supply, fill #0

## 2021-02-27 MED FILL — ROSUVASTATIN 5 MG TABLET: 5 mg | ORAL | 30 days supply | Qty: 30 | Fill #2

## 2021-03-02 ENCOUNTER — Encounter: Payer: PRIVATE HEALTH INSURANCE | Attending: Family | Primary: Family

## 2021-03-08 ENCOUNTER — Encounter: Payer: Self-pay | Admitting: Internal Medicine

## 2021-03-08 ENCOUNTER — Ambulatory Visit (INDEPENDENT_AMBULATORY_CARE_PROVIDER_SITE_OTHER): Payer: 59 | Admitting: Internal Medicine

## 2021-03-08 ENCOUNTER — Other Ambulatory Visit: Payer: Self-pay

## 2021-03-08 VITALS — BP 120/78 | HR 77 | Ht 62.01 in | Wt 149.8 lb

## 2021-03-08 DIAGNOSIS — E039 Hypothyroidism, unspecified: Secondary | ICD-10-CM

## 2021-03-08 DIAGNOSIS — Z8639 Personal history of other endocrine, nutritional and metabolic disease: Secondary | ICD-10-CM

## 2021-03-08 DIAGNOSIS — E890 Postprocedural hypothyroidism: Secondary | ICD-10-CM

## 2021-03-08 LAB — TSH: TSH: 0.18 u[IU]/mL — ABNORMAL LOW (ref 0.35–4.50)

## 2021-03-08 LAB — T4, FREE: Free T4: 0.91 ng/dL (ref 0.60–1.60)

## 2021-03-08 LAB — T3, FREE: T3, Free: 3.6 pg/mL (ref 2.3–4.2)

## 2021-03-08 NOTE — Patient Instructions (Addendum)
Please continue: - Levothyroxine 88 mcg daily - Liothyronine 5 mcg 2x a day Pending results.  Please take the thyroid hormone every day, with water, at least 30 minutes before breakfast, separated by at least 4 hours from: - acid reflux medications - calcium - iron - multivitamins  Please stop Ashwaganda.  Please return in 3 months.  Hypothyroidism  Hypothyroidism is when the thyroid gland does not make enough of certain hormones (it is underactive). The thyroid gland is a small gland located in the lower front part of the neck, just in front of the windpipe (trachea). This gland makes hormones that help control how the body uses food for energy (metabolism) as well as how the heart and brain function. These hormones also play a role in keeping your bones strong. When the thyroid is underactive, it produces too little of the hormones thyroxine (T4) and triiodothyronine (T3). What are the causes? This condition may be caused by:  Hashimoto's disease. This is a disease in which the body's disease-fighting system (immune system) attacks the thyroid gland. This is the most common cause.  Viral infections.  Pregnancy.  Certain medicines.  Birth defects.  Past radiation treatments to the head or neck for cancer.  Past treatment with radioactive iodine.  Past exposure to radiation in the environment.  Past surgical removal of part or all of the thyroid.  Problems with a gland in the center of the brain (pituitary gland).  Lack of enough iodine in the diet. What increases the risk? You are more likely to develop this condition if:  You are female.  You have a family history of thyroid conditions.  You use a medicine called lithium.  You take medicines that affect the immune system (immunosuppressants). What are the signs or symptoms? Symptoms of this condition include:  Feeling as though you have no energy (lethargy).  Not being able to tolerate cold.  Weight gain  that is not explained by a change in diet or exercise habits.  Lack of appetite.  Dry skin.  Coarse hair.  Menstrual irregularity.  Slowing of thought processes.  Constipation.  Sadness or depression. How is this diagnosed? This condition may be diagnosed based on:  Your symptoms, your medical history, and a physical exam.  Blood tests. You may also have imaging tests, such as an ultrasound or MRI. How is this treated? This condition is treated with medicine that replaces the thyroid hormones that your body does not make. After you begin treatment, it may take several weeks for symptoms to go away. Follow these instructions at home:  Take over-the-counter and prescription medicines only as told by your health care provider.  If you start taking any new medicines, tell your health care provider.  Keep all follow-up visits as told by your health care provider. This is important. ? As your condition improves, your dosage of thyroid hormone medicine may change. ? You will need to have blood tests regularly so that your health care provider can monitor your condition. Contact a health care provider if:  Your symptoms do not get better with treatment.  You are taking thyroid hormone replacement medicine and you: ? Sweat a lot. ? Have tremors. ? Feel anxious. ? Lose weight rapidly. ? Cannot tolerate heat. ? Have emotional swings. ? Have diarrhea. ? Feel weak. Get help right away if you have:  Chest pain.  An irregular heartbeat.  A rapid heartbeat.  Difficulty breathing. Summary  Hypothyroidism is when the thyroid gland does not  make enough of certain hormones (it is underactive).  When the thyroid is underactive, it produces too little of the hormones thyroxine (T4) and triiodothyronine (T3).  The most common cause is Hashimoto's disease, a disease in which the body's disease-fighting system (immune system) attacks the thyroid gland. The condition can also be  caused by viral infections, medicine, pregnancy, or past radiation treatment to the head or neck.  Symptoms may include weight gain, dry skin, constipation, feeling as though you do not have energy, and not being able to tolerate cold.  This condition is treated with medicine to replace the thyroid hormones that your body does not make. This information is not intended to replace advice given to you by your health care provider. Make sure you discuss any questions you have with your health care provider. Document Revised: 07/22/2020 Document Reviewed: 07/07/2020 Elsevier Patient Education  2021 [employer].
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
## 2021-03-09 LAB — THYROID PEROXIDASE ANTIBODY: Thyroperoxidase Ab SerPl-aCnc: 2 IU/mL (ref <9)

## 2021-03-09 LAB — THYROGLOBULIN ANTIBODY: Thyroglobulin Ab: <1 IU/mL (ref <=1)

## 2021-03-14 ENCOUNTER — Other Ambulatory Visit: Admit: 2021-03-14 | Discharge: 2021-03-14 | Payer: PRIVATE HEALTH INSURANCE | Primary: Family

## 2021-03-14 DIAGNOSIS — Z0000 Encounter for general adult medical examination without abnormal findings: Secondary | ICD-10-CM

## 2021-03-14 LAB — CBC WITH AUTO DIFFERENTIAL
Basophils %: 1 % (ref 0–2)
Basophils, Absolute: 0.1 10*3/??L (ref 0.0–0.2)
Eosinophils %: 9 % — ABNORMAL HIGH (ref 0–7)
Eosinophils, Absolute: 0.8 10*3/??L — ABNORMAL HIGH (ref 0.0–0.7)
HCT: 39.4 % (ref 37.0–48.0)
Hemoglobin: 13.4 g/dL (ref 12.0–16.0)
Lymphocytes %: 28 % (ref 25–45)
Lymphocytes, Absolute: 2.4 10*3/??L (ref 1.1–4.3)
MCH: 29.8 pg (ref 27.0–34.0)
MCHC: 34 g/dL (ref 32.0–36.0)
MCV: 87.5 fL (ref 81.0–99.0)
MPV: 10 fL (ref 7.4–10.4)
Monocytes %: 7 % (ref 0–12)
Monocytes, Absolute: 0.6 10*3/??L (ref 0.0–1.2)
Neutrophils %: 55 % (ref 35–70)
Neutrophils, Absolute: 4.7 10*3/??L (ref 1.6–7.3)
Platelet Count: 298 10*3/??L (ref 150–400)
RBC: 4.5 10*6/??L (ref 4.20–5.40)
RDW: 13.1 % (ref 11.5–14.5)
WBC: 8.6 10*3/??L (ref 4.8–10.8)

## 2021-03-21 ENCOUNTER — Encounter: Payer: PRIVATE HEALTH INSURANCE | Attending: Family | Primary: Family

## 2021-03-23 NOTE — Telephone Encounter (Signed)
From: [name]  To: [name], FNP  Sent: 03/22/2021 4:23 AM PDT  Subject: video calls    Im sorry i have to cancel my appt yesterday but had a family emergency. Do you still do video calls? if you do could we possibly set up one to go over labs?     Kerry Allen

## 2021-03-24 ENCOUNTER — Telehealth: Admit: 2021-03-24 | Discharge: 2021-03-24 | Payer: PRIVATE HEALTH INSURANCE | Attending: Family | Primary: Family

## 2021-03-24 DIAGNOSIS — E063 Autoimmune thyroiditis: Secondary | ICD-10-CM

## 2021-03-24 NOTE — Progress Notes (Signed)
Video Visit Progress Note     Kerry Allen a 45 y.o. female is being seen today via live audio/video using MyChart for Review Lab Results  .     The patient's current location is [place] [address] [place] [38] [phone].  The provider's location during this telemedicine visit is My home office at the following zip: [phone].    Patient's Name and Date of Birth confirmed: done    This telemedicine visit is being conducted due to current COVID-19 restrictions.  Informed patient that although telemedicine have great potential benefits, not everything can be addressed during a telemedicine visit. As with any medical procedure there are potential risks, such as insufficient information for good medical decision making and risks to private information. Risk to private information is higher when using third-party apps like Facetime/GoogleDuo/Skype. Facetime/GoogleDuo/Skype visits will only allowed during the COVID-19 national emergency. Telemedicine is also limited to certain non-emergent concerns.     Instructed patient/guardian: If connection is lost and video does not automatically reconnect, patient's contact number is: Cell Number [phone]    Provider read the above statement and patient consented to continue with the visit: Yes    History for today's visit obtained from patient. The Patient is the medical decision(s) maker attending today's appointment.    ASSESSMENT AND PLAN:  Problem List Items Addressed This Visit        Active Problems    Hashimoto's thyroiditis - Primary    Relevant Orders    T4, Free -Routine (Completed)    Thyroid Stimulating Hormone -Routine (Completed)    Thyroglobulin and Thyroperoxidase Antibodies -Routine (Completed)    T3, Free -Routine (Completed)    Ultrasound thyroid      Other Visit Diagnoses     Lipedema        Relevant Orders    Non-AHN Referral to Lymphedema Specialist    Mouth dryness        Relevant Orders    ANA Reflex Cascade 1 -Routine    Cyclic Citrullinated  Peptide Ab, IgG -Routine    Anti-SSA and Anti-SSB Antibodies -Routine    C-reactive Protein High Sensitivity -Routine (Completed)    Erythrocyte Sedimentation Rate, Automated -Routine (Completed)    Dry eyes, bilateral              Plan:     See Dr. Clovis Riley, OD and Dr. Leanord Hawking.    Check labs today for Sjogren's/autoimmune labs.     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 Start diligently wearing compression stockings.     Refresh tears during the day and refresh gel at night for the dry eyes.     Continue the biotene mouth wash/spray.         Support for the above plan obtained during this visit:    HPI  Lab Results  Pt here to discuss the following lab results as listed below. Pt educated regarding the findings and treatment plan developed.     Pt states that she has been having dry mouth, top of her mouth and tongue are numb for at least 3-4 months. Has just been using biotene. Queried pt about dry mouth/joint pain and endorses having both. States that the past couple of years in the winter time, she has had increase in joint pain. States that she has been retaining water/feeling bloated at times. Has also noticed that she bruises easy, has tenderness to her adipose tissue. Reports swelling to her lower extremities most  days.     States that about 4 years ago, Dr. Clovis Riley saw some floaters and raised concern about glaucoma.     ALLERGIES:  Allergies   Allergen Reactions   . Penicillins Rash     When she was a child       Outpatient Medications Marked as Taking for the 03/24/21 encounter (Telemedicine2) with [name], FNP   Medication Sig Dispense Refill   . acetylcysteine (NAC) 600 mg Cap Take 1 capsule twice a day 180 capsule 1   . atenoloL (TENORMIN) 25 MG tablet Take 1/2 tablet by mouth daily. 30 tablet 2   . azithromycin (ZITHROMAX) 250 MG tablet Take 2 tablets by mouth on day one, then 1 tablet daily until finished. 6 tablet 0   . b complex vitamins capsule One  Elevated Methylfolate+ (B-Complex) 1 cap every morning     . cholecalciferol, vitamin D3, 125 mcg (5,000 unit) capsule Take 5,000 IU daily and Monday, Wednesday and Friday take 16109 IU. 100 capsule prn   . dextroamphetamine-amphetamine ER (ADDERALL XR) 20 MG 24 hr capsule Take 1 capsule by mouth every morning for 28 days 28 capsule 0   . hydroCHLOROthiazide (HYDRODIURIL) 25 MG tablet Take 1 tablet by mouth daily for hypertension. 30 tablet 2   . INOSITOL, BULK, MISC by Miscellaneous route.     Marland Kitchen levomefolate calcium (L-METHYLFOLATE) 10 mg Cap capsule Take 10 mg daily 90 capsule    . levothyroxine (SYNTHROID) 150 MCG tablet Take 1 tablet by mouth daily. 90 tablet 1   . liothyronine (CYTOMEL) 5 MCG tablet Take 1 tablet by mouth daily. 30 tablet 2   . omega-3 fatty acids 1,000 mg Cap Take 1 capsule nightly with largest meal 180 each 1   . progesterone (PROMETRIUM) 100 MG capsule Take 1 capsule by mouth daily for hormone replacement therapy 30 capsule 2   . rosuvastatin (CRESTOR) 5 MG tablet Take 1 tablet by mouth daily. 30 tablet 2       VITALS:  There were no vitals filed for this visit.  There is no height or weight on file to calculate BMI.  No LMP recorded. Patient has had an ablation.    Patient weight not recorded     Wt Readings from Last 3 Encounters:   12/22/20 165 lb (74.8 kg)   09/13/20 166 lb 6.4 oz (75.5 kg)   06/20/20 170 lb (77.1 kg)       Review of Systems   Constitutional: Negative for chills, fever and malaise/fatigue.   HENT:        Dry mouth   Eyes:        Dry eye   Respiratory: Negative for cough and shortness of breath.    Cardiovascular: Negative for chest pain and palpitations.   Skin:        See HPI       Also see HPI for ROS    Physical Exam   Psychiatric:   APPEARANCE: neatly groomed and appropriate dress for age and situation   CONCIOUSNESS: awake, Responds Appropriately, and alert ,   ORIENTATION: person, place, and time/date  GAIT/STATION: normal  MUSCLE STRENGTH/TONE: normal  EYE  CONTACT: Good eye contact  PSYCHOMOTOR ACTIVITY: within normal limits  INVOLUNTARY MOVEMENTS: not present  SPEECH: Ordinary, Clear, Offers information, Normal  MOOD: Pleasant  AFFECT: mood-congruent  BEHAVIOR:  cooperative,  friendly, relaxed, and good rapport  ENERGY LEVEL: good  THOUGHT PROCESS:  normal  THOUGHT  Relevant to topic being  discussed  THOUGHT Content: appropriate  INSIGHT/JUDGMENT: age appropriate  MEMORY:  intact  ATTENTION/CONCENTRATION: Sufficient and Adequate concentration  FUND OF KNOWLEDGE:   grossly intact, is aware of current events, is appropriate for age  LEVEL OF SELF ESTEEM:  normal      *Any tenderness or palpable anomaly was elicited/reported by patient or caregiver through provider instructed exam.        Items reviewed in preparation for this visit:  The following portions of the patient's history were reviewed and updated as appropriate: allergies, current medications, past family history, past medical history, past surgical history and problem list.  When necessary and appropriate, prior medical records, medical history from family members or other providers were reviewed for coordination of care.     LABWORK COMPLETED PRIOR TO THIS VISIT:  Lab Walk-In on 03/14/2021   Component Date Value Ref Range Status   . WBC 03/14/2021 8.6  4.8 - 10.8 10*3/?L Final   . RBC 03/14/2021 4.50  4.20 - 5.40 10*6/?L Final   . Hemoglobin 03/14/2021 13.4  12.0 - 16.0 g/dL Final   . HCT 16/08/9603 39.4  37.0 - 48.0 % Final   . MCV 03/14/2021 87.5  81.0 - 99.0 fL Final   . MCH 03/14/2021 29.8  27.0 - 34.0 pg Final   . MCHC 03/14/2021 34.0  32.0 - 36.0 g/dL Final   . RDW 54/07/8118 13.1  11.5 - 14.5 % Final   . Platelet Count 03/14/2021 298  150 - 400 10*3/?L Final   . MPV 03/14/2021 10.0  7.4 - 10.4 fL Final   . Neutrophils % 03/14/2021 55  35 - 70 % Final   . Lymphocytes % 03/14/2021 28  25 - 45 % Final   . Monocytes % 03/14/2021 7  0 - 12 % Final   . Eosinophils % 03/14/2021 9 (A) 0 - 7 % Final   .  Basophils % 03/14/2021 1  0 - 2 % Final   . Neutrophils, Absolute 03/14/2021 4.7  1.6 - 7.3 10*3/?L Final   . Lymphocytes, Absolute 03/14/2021 2.4  1.1 - 4.3 10*3/?L Final   . Monocytes, Absolute 03/14/2021 0.6  0.0 - 1.2 10*3/?L Final   . Eosinophils, Absolute 03/14/2021 0.8 (A) 0.0 - 0.7 10*3/?L Final   . Basophils, Absolute 03/14/2021 0.1  0.0 - 0.2 10*3/?L Final   . Differential Type 03/14/2021 Automated Differential   Final       RADIOLOGY STUDIES COMPLETED PRIOR TO THIS VISIT:  No results found.    Health Maintenance Due  Health Maintenance Due   Topic Date Due   . Cervical  05/29/2018       Past Medical History:   Diagnosis Date   . Abnormal ultrasound 05/13/2017   . Acute non-recurrent maxillary sinusitis 10/06/2020   . hx of Graves' disease 2002    s/p I131  in 2002   . Postablative hypothyroidism     Hypothyroidism-Dr. Leonia Reader 2002   . Vitamin D deficiency      Past Surgical History:   Procedure Laterality Date   . ABDOMINAL SURGERY     . APPENDECTOMY     . AUGMENTATION MAMMAPLASTY Bilateral 2020   . CERVICAL BIOPSY     . PANNICULECTOMY  2005    tummy tuck   . PROCEDURE Right 06/27/2017    Procedure: LAPAROSCOPY;  ADHESIOLYSIS; ARGON BEAM TREATMENT OF EXTENSIVE ENDOMETRIOSIS;  Surgeon: Rolene Arbour, MD;  Location[facility] OR;  Service: Gynecology;  Laterality: Right;   .  PROCEDURE N/A 11/08/2017    Procedure: COLONOSCOPY with sedation;  Surgeon: Olin Pia, MD;  Location: [facility] ENDO;  Service: Endoscopy;  Laterality: N/A;   . PROCEDURE Right 09/18/2018    Procedure: LAPAROSCOPY; RIGHTSALPINGO-OOPHORECTOMY; APPENDECTOMY;  LYSIS OF PERIADENEXAL ADHESIONS;  Surgeon: Rolene Arbour, MD;  Location: [facility] OR;  Service: Gynecology;  Laterality: Right;   . TUBAL LIGATION      Tubal ligation 2008     Family History   Problem Relation Age of Onset   . Diabetes Mother    . Heart attack Mother         during a heart cath   . Lung cancer Mother         stage III   . Tobacco Dependence Mother    . Heart attack Father    .  Tobacco Dependence Father    . No Known Health Problems Son    . Diabetes type II Maternal Grandmother    . Hypertension Maternal Grandmother    . Diabetes type II Paternal Grandmother    . Hypertension Paternal Grandmother    . Coronary artery disease Paternal Grandfather    . Heart attack Paternal Grandfather    . No Known Health Problems Son    . Breast cancer Neg Hx    . Ovarian cancer Neg Hx       Social History     Socioeconomic History   . Marital status: Married     Spouse name: Jerrod   . Number of children: 2   Tobacco Use   . Smoking status: Never Smoker   . Smokeless tobacco: Never Used   . Tobacco comment: passive smoke exposure   Substance and Sexual Activity   . Alcohol use: Yes     Alcohol/week: 0.0 standard drinks     Comment: occasional    . Drug use: No   . Sexual activity: Yes     Partners: Male     Birth control/protection: Surgical     Comment: Tubal Ligation   Other Topics Concern   . Caffeine Concern Yes     Comment: moderate   . Exercise Yes     Comment: occasional        I had a thoughtful and careful discussion regarding the issues today. All questions were answered. I educated and reassured. I encouraged followup as needed. Hand written instructions were given via the After Visit Summary including reference literature as appropriate. Today's medical decision maker  verbalize understanding of the treatment plan and are in agreement with the treatment plan goals. Medical decision maker is aware of how to contact clinical provider if concerns arise.    I have recommended the patient seek medical attention right away for new or worsening symptoms.  For questions regarding your plan of care, please contact  [phone].    Time spent with patient in discussion, coordination of patient's care and documentation: 25 min    FOLLOW-UP:  Return for concerns re: current plan of care.   Future Appointments   Date Time Provider Department Center   03/30/2021  3:00 PM [facility] [place] ROOM 1 [id number]  [facility]     This chart was produced with the Epic system using voice recognition software, manual transcription, or both. Despite concurrent proofreading, please note that transcription errors are common and may not reflect the true intent of reporter.  Please contact us for clarification if any questions arise relating to the wording of this document.

## 2021-03-29 ENCOUNTER — Other Ambulatory Visit: Admit: 2021-03-29 | Discharge: 2021-03-29 | Payer: PRIVATE HEALTH INSURANCE | Primary: Family

## 2021-03-29 DIAGNOSIS — E063 Autoimmune thyroiditis: Secondary | ICD-10-CM

## 2021-03-29 LAB — ANTI SSA SSB
Anti-SSA Antibody: <0.2 U
Anti-SSB Antibody: <0.2 U

## 2021-03-29 LAB — CYCLIC CITRUL PEPTIDE ANTIBODY, IGG
Cyclic Citrullinated Peptide Ab, IgG Interpretation: NEGATIVE
Cyclic Citrullinated Peptide Ab, IgG: 1.9 Units (ref <7.0)

## 2021-03-29 LAB — TSH: TSH - Thyroid Stimulating Hormone: 1.48 ??IU/mL (ref 0.45–5.33)

## 2021-03-29 LAB — ERYTHROCYTE SEDIMENTATION RATE, AUTOMATED: Erythrocyte Sedimentation Rate, Automated: 8 mm/hr (ref 0–20)

## 2021-03-29 LAB — THYROPEROXIDASE ANTIBODY (LINKED PANEL): THYROPEROXIDASE ANTIBODY: 1.2 IU/mL (ref <9.00)

## 2021-03-29 LAB — T3, FREE: T3, Free: 3 pg/mL (ref 2.5–3.9)

## 2021-03-29 LAB — ANA REFLEX CASCADE 1
ANA Screen (Symphony) Interpretation: NEGATIVE
ANA Screen (Symphony): 0.2 Units (ref <0.70)
dsDNA Antibody, IgG Interpretation: NEGATIVE
dsDNA Antibody, IgG: 1.4 IU/mL (ref <10.0)

## 2021-03-29 LAB — THYROGLOBULIN ANTIBODY (LINKED PANEL): Thyroglobulin Antibody: <0.9 IU/mL (ref <4.0)

## 2021-03-29 LAB — T4, FREE: T4, Free: 0.9 ng/dL (ref 0.6–1.2)

## 2021-03-29 LAB — C-REACTIVE PROTEIN (HIGH SENSITIVITY): C-Reactive Protein High Sensitivity: 4.24 mg/L — ABNORMAL HIGH (ref <=3.00)

## 2021-03-30 ENCOUNTER — Inpatient Hospital Stay: Admit: 2021-03-30 | Discharge: 2021-03-30 | Payer: PRIVATE HEALTH INSURANCE | Attending: Family | Primary: Family

## 2021-03-30 DIAGNOSIS — E063 Autoimmune thyroiditis: Secondary | ICD-10-CM

## 2021-04-06 MED ORDER — dextroamphetamine-amphetamine ER (ADDERALL XR) 20 MG 24 hr capsule
20 | ORAL_CAPSULE | Freq: Every morning | ORAL | 0 refills | Status: DC
Start: 2021-04-06 — End: 2021-05-19
  Filled 2021-04-12: qty 28, 28d supply, fill #0

## 2021-04-06 MED ORDER — rosuvastatin (CRESTOR) 5 MG tablet
5 | ORAL_TABLET | Freq: Every day | ORAL | 2 refills | Status: DC
Start: 2021-04-06 — End: 2021-08-08
  Filled 2021-04-12: qty 30, 30d supply, fill #0

## 2021-04-06 NOTE — Telephone Encounter (Signed)
LOV - 03/24/21  NOV - 0

## 2021-04-12 MED FILL — ATENOLOL 25 MG TABLET: 25 mg | ORAL | 60 days supply | Qty: 30 | Fill #1

## 2021-04-12 MED FILL — LEVOTHYROXINE 150 MCG TABLET: 150 ug | ORAL | 90 days supply | Qty: 90 | Fill #1

## 2021-04-12 MED FILL — HYDROCHLOROTHIAZIDE 25 MG TABLET: 25 mg | ORAL | 30 days supply | Qty: 30 | Fill #1

## 2021-04-12 MED FILL — PROGESTERONE MICRONIZED 100 MG CAPSULE: 100 mg | ORAL | 30 days supply | Qty: 30 | Fill #1

## 2021-04-12 MED FILL — LIOTHYRONINE 5 MCG TABLET: 5 ug | ORAL | 30 days supply | Qty: 30 | Fill #1

## 2021-04-26 ENCOUNTER — Other Ambulatory Visit (INDEPENDENT_AMBULATORY_CARE_PROVIDER_SITE_OTHER): Payer: 59

## 2021-04-26 ENCOUNTER — Other Ambulatory Visit: Payer: Self-pay

## 2021-04-26 DIAGNOSIS — E039 Hypothyroidism, unspecified: Secondary | ICD-10-CM | POA: Diagnosis not present

## 2021-04-26 LAB — T4, FREE: Free T4: 0.77 ng/dL (ref 0.60–1.60)

## 2021-04-26 LAB — T3, FREE: T3, Free: 3.3 pg/mL (ref 2.3–4.2)

## 2021-04-26 LAB — TSH: TSH: 1.12 u[IU]/mL (ref 0.35–4.50)

## 2021-05-19 NOTE — Telephone Encounter (Signed)
LOV - 03/24/21  NOV - 0

## 2021-05-20 MED ORDER — dextroamphetamine-amphetamine ER (ADDERALL XR) 20 MG 24 hr capsule
20 | ORAL_CAPSULE | Freq: Every morning | ORAL | 0 refills | Status: DC
Start: 2021-05-20 — End: 2021-06-22
  Filled 2021-05-22: qty 28, 28d supply, fill #0

## 2021-05-22 MED FILL — LIOTHYRONINE 5 MCG TABLET: 5 ug | ORAL | 20 days supply | Qty: 20 | Fill #2

## 2021-05-22 MED FILL — HYDROCHLOROTHIAZIDE 25 MG TABLET: 25 mg | ORAL | 30 days supply | Qty: 30 | Fill #2

## 2021-05-22 MED FILL — LIOTHYRONINE 5 MCG TABLET: 5 ug | ORAL | 10 days supply | Qty: 10 | Fill #3

## 2021-05-22 MED FILL — ROSUVASTATIN 5 MG TABLET: 5 mg | ORAL | 30 days supply | Qty: 30 | Fill #1

## 2021-05-22 MED FILL — PROGESTERONE MICRONIZED 100 MG CAPSULE: 100 mg | ORAL | 30 days supply | Qty: 30 | Fill #2

## 2021-06-22 MED ORDER — dextroamphetamine-amphetamine ER (ADDERALL XR) 20 MG 24 hr capsule
20 | ORAL_CAPSULE | Freq: Every morning | ORAL | 0 refills | Status: DC
Start: 2021-06-22 — End: 2021-08-08
  Filled 2021-06-23: qty 28, 28d supply, fill #0

## 2021-06-22 MED ORDER — hydroCHLOROthiazide (HYDRODIURIL) 25 MG tablet
25 | ORAL_TABLET | Freq: Every day | ORAL | 2 refills | Status: DC
Start: 2021-06-22 — End: 2021-11-15
  Filled 2021-06-23: qty 30, 30d supply, fill #0

## 2021-06-22 MED ORDER — liothyronine (CYTOMEL) 5 MCG tablet
5 | ORAL_TABLET | Freq: Every day | ORAL | 2 refills | 90.00000 days | Status: DC
Start: 2021-06-22 — End: 2021-12-11
  Filled 2021-06-23: qty 30, 30d supply, fill #0

## 2021-06-22 NOTE — Telephone Encounter (Signed)
LOV - 03/24/21  NOV - 0

## 2021-06-22 NOTE — Telephone Encounter (Signed)
Routing to correct team

## 2021-06-23 MED FILL — ATENOLOL 25 MG TABLET: 25 mg | ORAL | 60 days supply | Qty: 30 | Fill #2

## 2021-06-23 MED FILL — ROSUVASTATIN 5 MG TABLET: 5 mg | ORAL | 30 days supply | Qty: 30 | Fill #2

## 2021-06-26 ENCOUNTER — Ambulatory Visit: Payer: 59 | Admitting: Internal Medicine

## 2021-07-05 MED ORDER — progesterone (PROMETRIUM) 100 MG capsule
100 | ORAL_CAPSULE | Freq: Every day | ORAL | 2 refills | 90.00000 days | Status: DC
Start: 2021-07-05 — End: 2021-12-11
  Filled 2021-07-14: qty 30, 30d supply, fill #0

## 2021-08-01 ENCOUNTER — Ambulatory Visit: Payer: 59 | Admitting: Internal Medicine

## 2021-08-06 IMAGING — US US THYROID
1 series · 13 of 25 positions shown · non-contrast
Comparison: None.

CLINICAL DATA: Palpable abnormality.

EXAM:
THYROID ULTRASOUND
TECHNIQUE: Ultrasound examination of the thyroid gland and adjacent soft
tissues was performed.

[Series 1: us thyroid · 0.04mm/px · 13 of 61 slices shown]
[im 1/61]
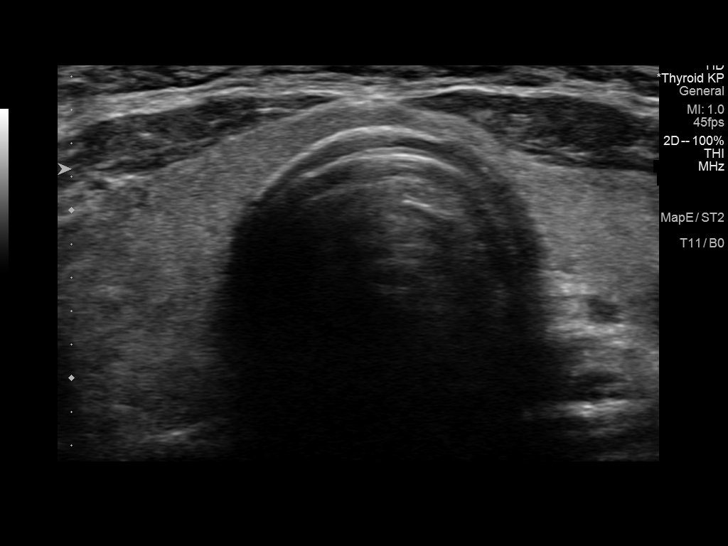
[im 6/61]
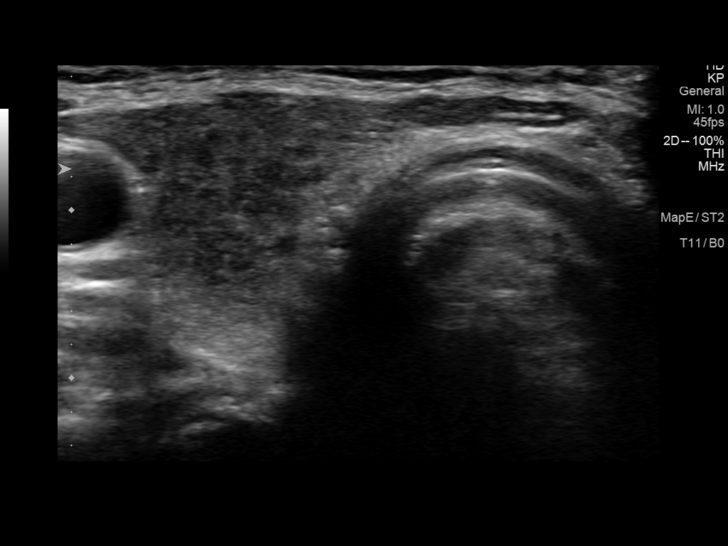
[im 11/61]
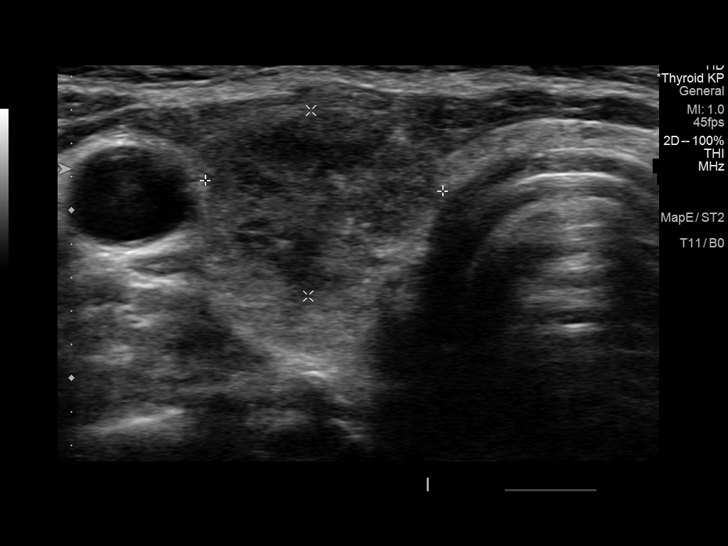
[im 16/61]
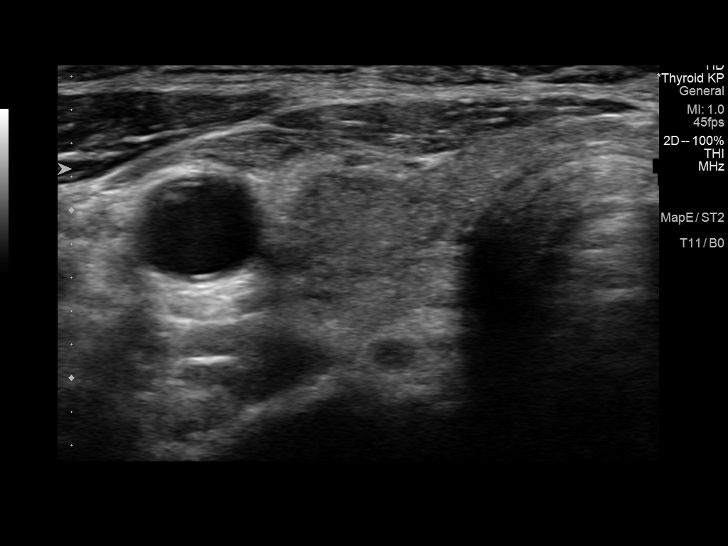
[im 21/61]
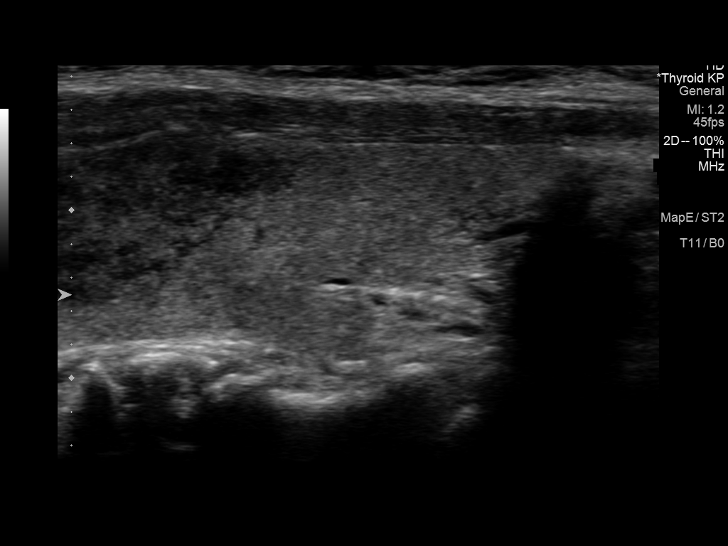
[im 26/61]
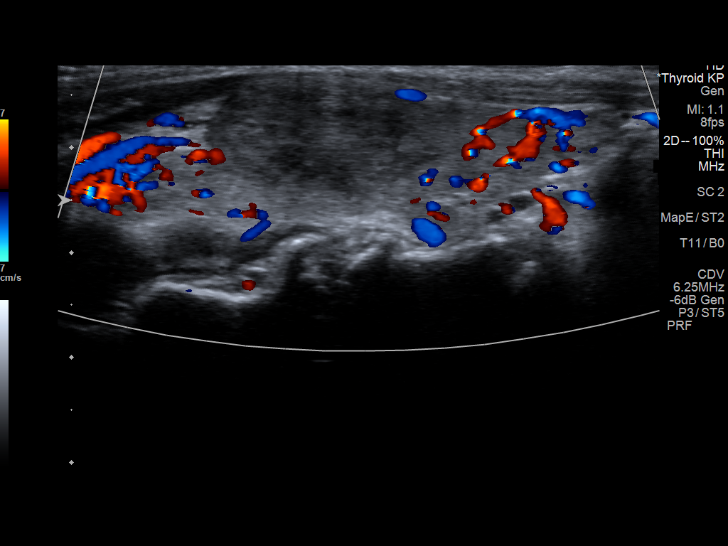
[im 31/61]
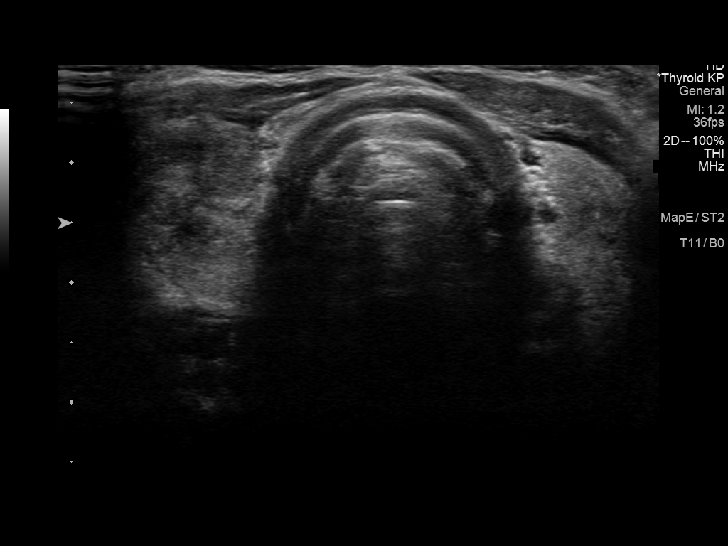
[im 36/61]
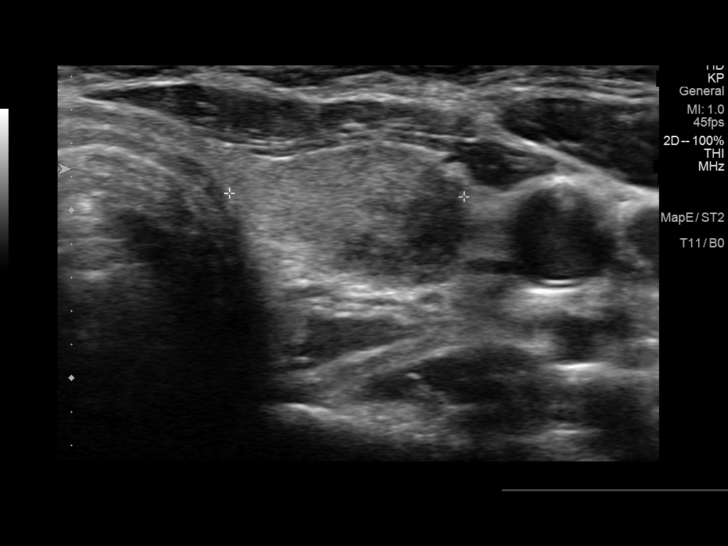
[im 41/61]
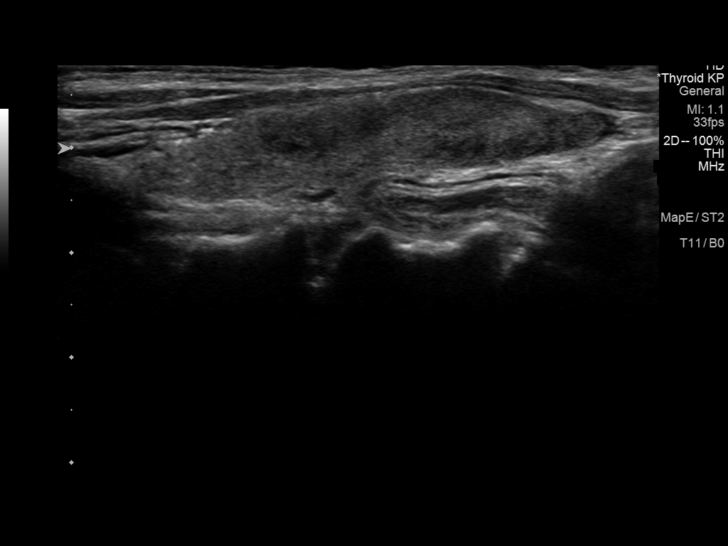
[im 46/61]
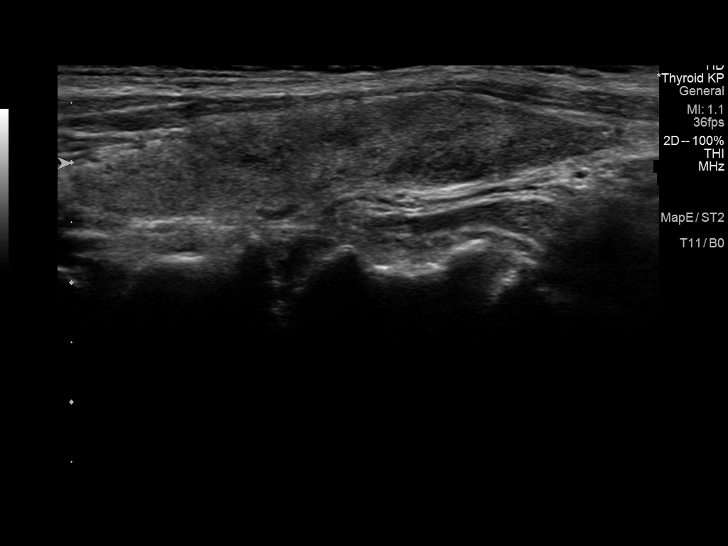
[im 51/61]
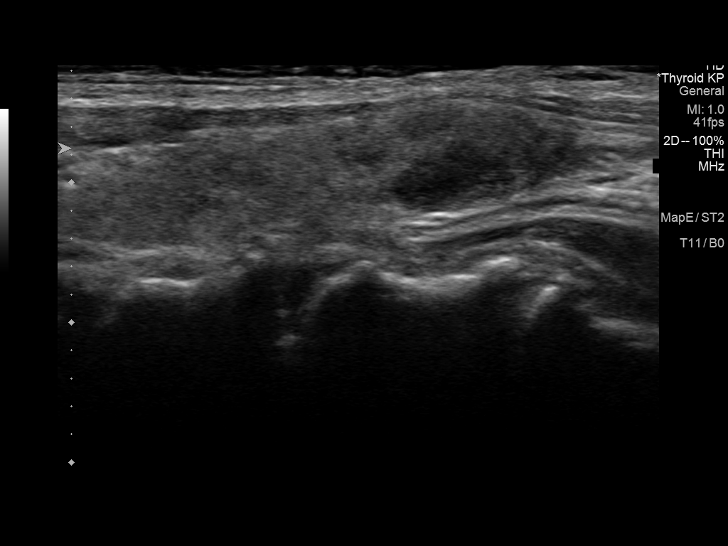
[im 56/61]
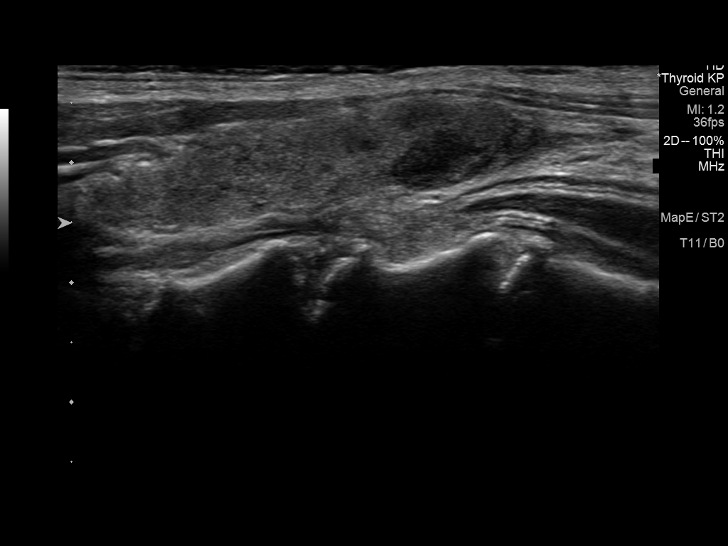
[im 61/61]
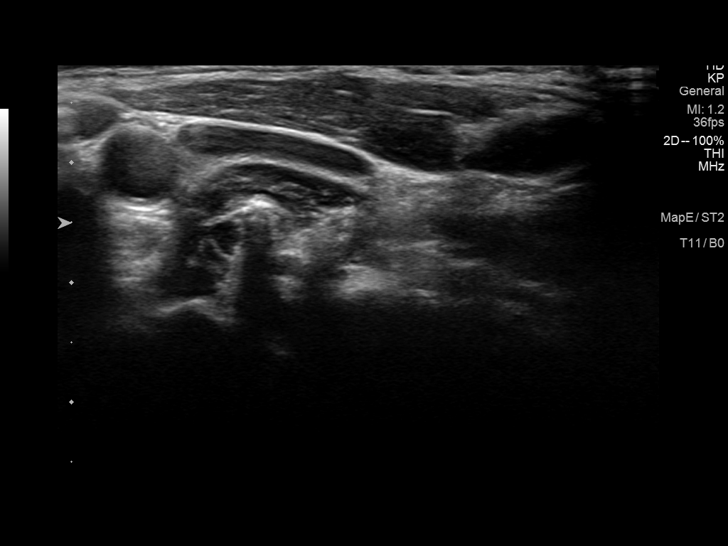

[13 of 25 positions shown; findings below may reference images not displayed]

FINDINGS: Parenchymal Echotexture: Mildly heterogenous

Isthmus: 0.2 cm

Right lobe: 5.0 x 1.3 x 1.6 cm

Left lobe: 4.7 x 0.9 x 1.4 cm

_________________________________________________________

Estimated total number of nodules >/= 1 cm: 1

Number of spongiform nodules >/=  2 cm not described below (TR1): 0

Number of mixed cystic and solid nodules >/= 1.5 cm not described
below (TR2): 0

_________________________________________________________

Nodule # 1:

Location: Right; Superior

Maximum size: 2.4 cm; Other 2 dimensions: 1.4 x 1.1 cm

Composition: solid/almost completely solid (2)

Echogenicity: hypoechoic (2)

Shape: not taller-than-wide (0)

Margins: ill-defined (0)

Echogenic foci: none (0)

ACR TI-RADS total points: 4.

ACR TI-RADS risk category: TR4 (4-6 points).

ACR TI-RADS recommendations:

**Given size (>/= 1.5 cm) and appearance, fine needle aspiration of
this moderately suspicious nodule should be considered based on
TI-RADS criteria.

_________________________________________________________

Nodule # 2:

Location: Left; Inferior

Maximum size: 0.9 cm; Other 2 dimensions: 0.5 x 0.5 cm

Composition: solid/almost completely solid (2)

Echogenicity: hypoechoic (2)

Shape: not taller-than-wide (0)

Margins: ill-defined (0)

Echogenic foci: none (0)

ACR TI-RADS total points: 4.

ACR TI-RADS risk category: TR4 (4-6 points).

ACR TI-RADS recommendations:

Given size (<1.0 cm) And appearance, this nodule does NOT meet
TI-RADS criteria for biopsy or dedicated follow-up.

_________________________________________________________
IMPRESSION: Solid nodule in the right superior thyroid which corresponds to the
palpable area (labeled 1, 2.4 cm, TI-RADS category 4) and meets
criteria for tissue sampling. Recommend ultrasound-guided
fine-needle aspiration.

The above is in keeping with the ACR TI-RADS recommendations - [HOSPITAL] 9889;[DATE].

## 2021-08-08 MED ORDER — dextroamphetamine-amphetamine ER (ADDERALL XR) 20 MG 24 hr capsule
20 | ORAL_CAPSULE | Freq: Every morning | ORAL | 0 refills | Status: DC
Start: 2021-08-08 — End: 2021-09-11
  Filled 2021-08-17: qty 28, 28d supply, fill #0

## 2021-08-08 MED ORDER — rosuvastatin (CRESTOR) 5 MG tablet
5 | ORAL_TABLET | Freq: Every day | ORAL | 2 refills | Status: DC
Start: 2021-08-08 — End: 2021-12-11
  Filled 2021-08-17: qty 30, 30d supply, fill #0

## 2021-08-08 NOTE — Telephone Encounter (Signed)
LOV - 03/24/21  NOV - 0

## 2021-08-15 ENCOUNTER — Encounter: Payer: PRIVATE HEALTH INSURANCE | Primary: Family

## 2021-08-15 IMAGING — US US FNA BIOPSY THYROID 1ST LESION
1 series · 13 of 16 positions shown · non-contrast
Comparison: US 12/12/20

MEDICATIONS:
5 cc 1% lidocaine

COMPLICATIONS:
None immediate.

INDICATION: Indeterminate thyroid nodule

Right superior thyroid nodule
2.4 cm
EXAM:
ULTRASOUND GUIDED FINE NEEDLE ASPIRATION OF INDETERMINATE THYROID
NODULE
TECHNIQUE: Informed written consent was obtained from the patient after a
discussion of the risks, benefits and alternatives to treatment.
Questions regarding the procedure were encouraged and answered. A
timeout was performed prior to the initiation of the procedure.

[Series 1: us fna biopsy thyroid 1st lesion · 0.06mm/px · 16 acquisitions, 13 frames shown]
[im 1/16]
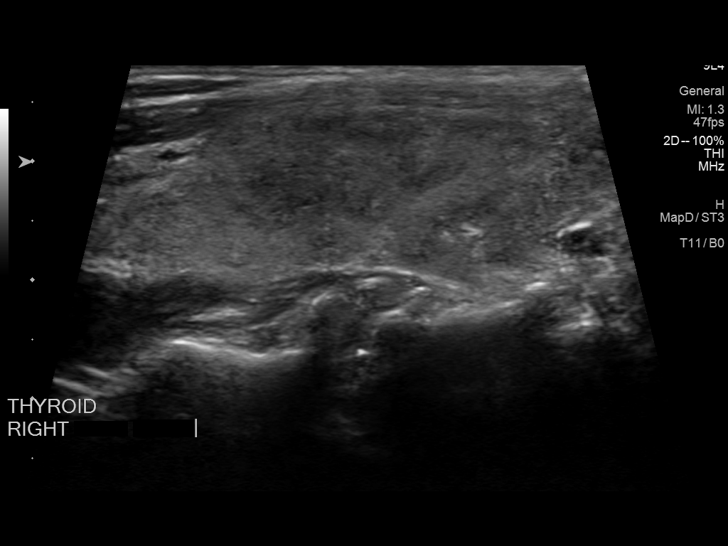
[im 2/16]
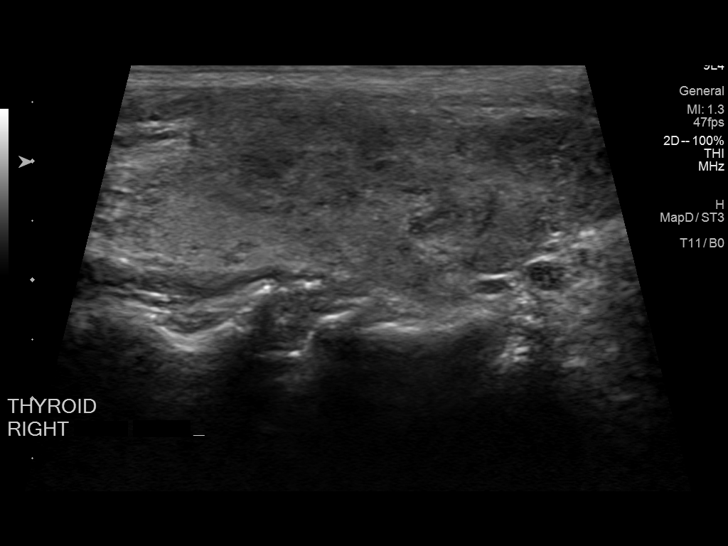
[im 4/16]
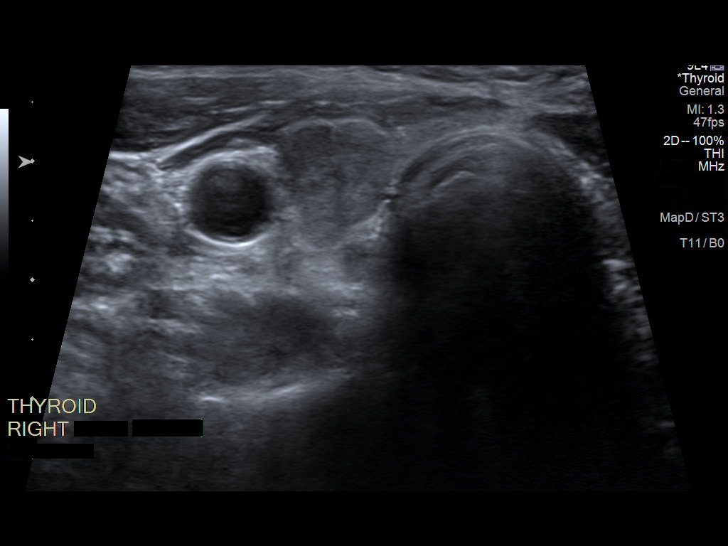
[im 5/16]
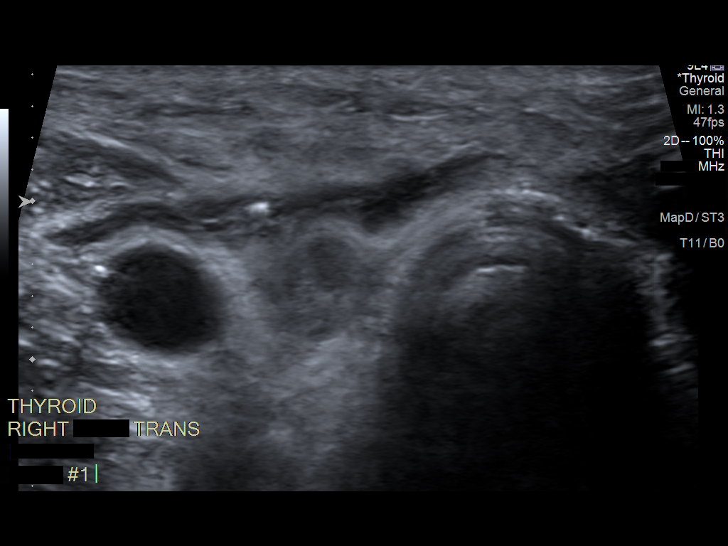
[im 6/16]
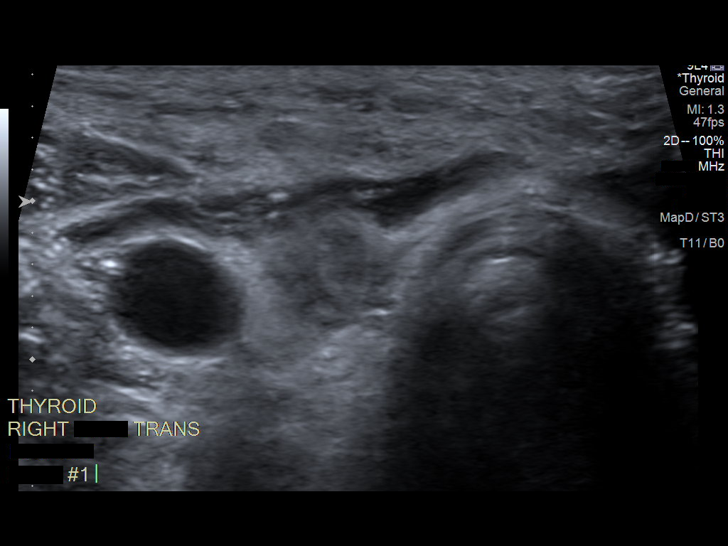
[im 7/16]
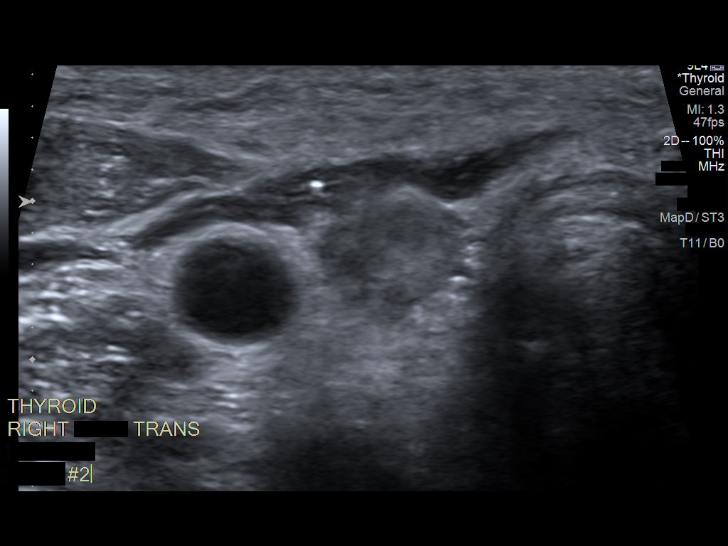
[im 9/16]
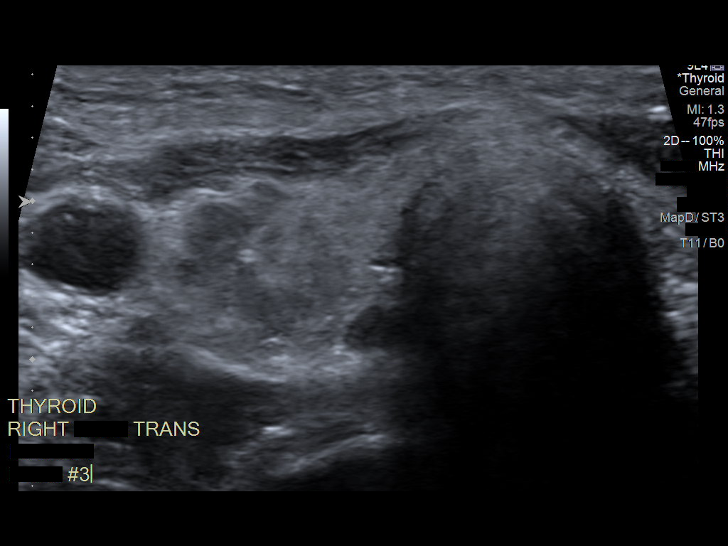
[im 10/16]
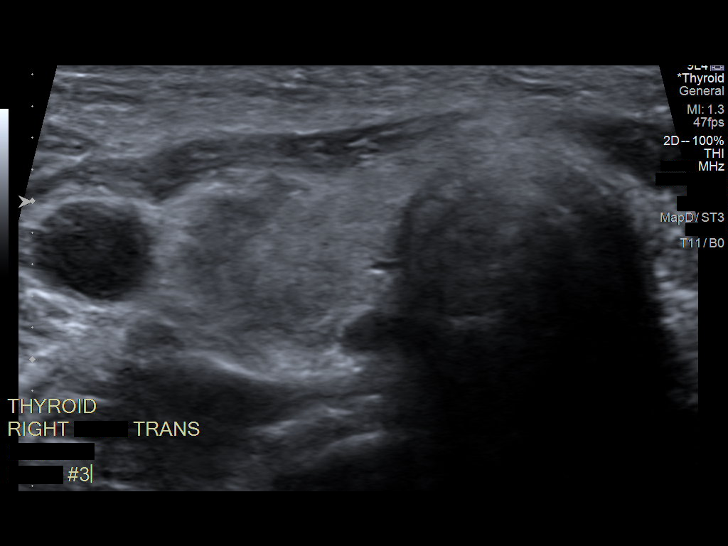
[im 11/16]
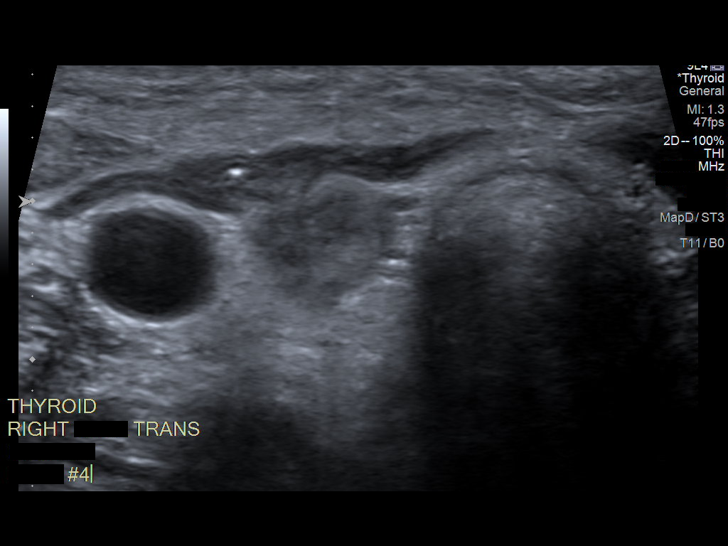
[im 12/16]
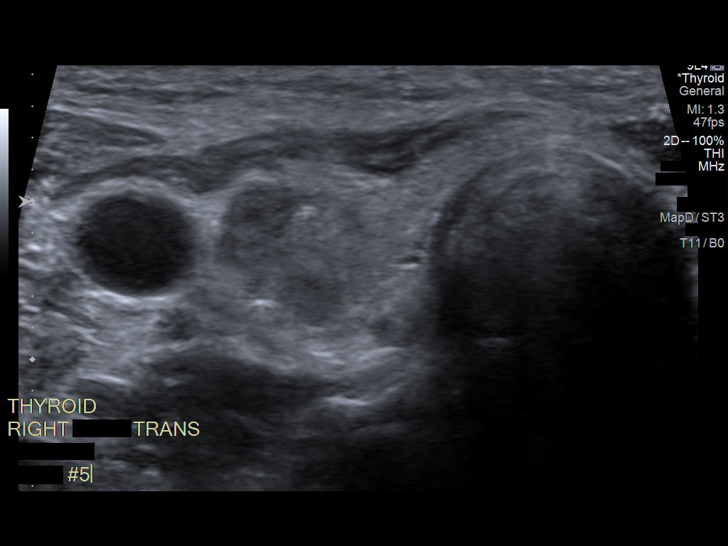
[im 13/16]
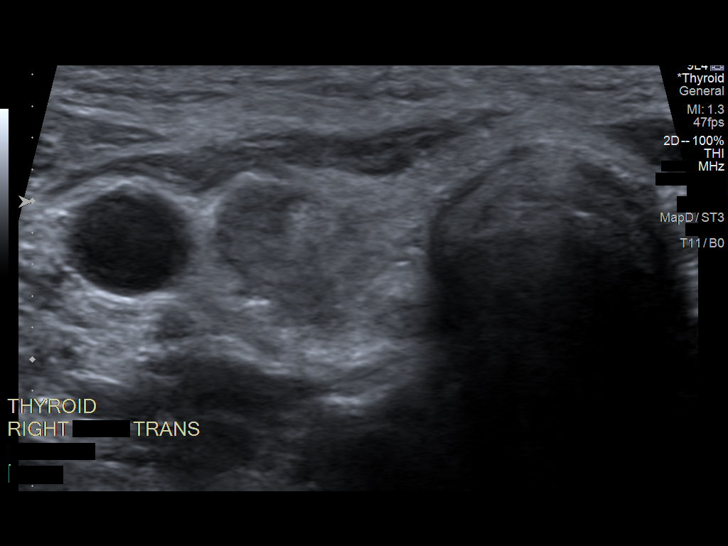
[im 15/16]
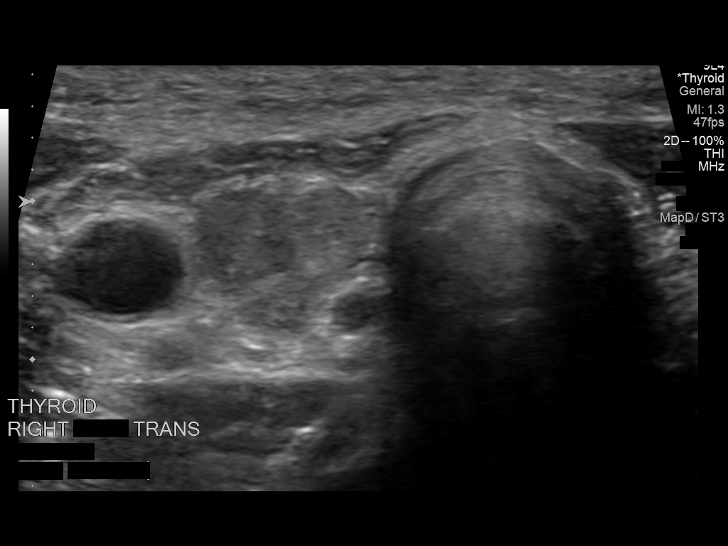
[im 16/16]
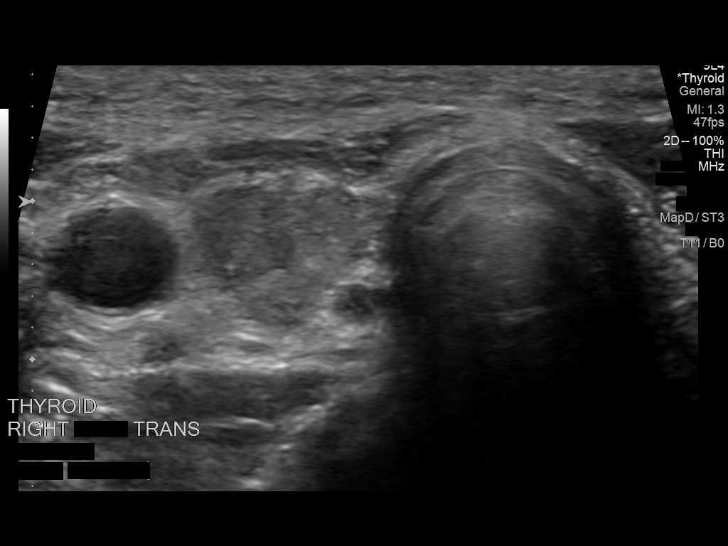

[13 of 16 positions shown; findings below may reference images not displayed]

Pre-procedural ultrasound scanning demonstrated unchanged size and
appearance of the indeterminate nodule within the right thyroid

The procedure was planned. The neck was prepped in the usual sterile
fashion, and a sterile drape was applied covering the operative
field. A timeout was performed prior to the initiation of the
procedure. Local anesthesia was provided with 1% lidocaine.

Under direct ultrasound guidance, 5 FNA biopsies were performed of
the right superior thyroid nodule with a 25 gauge needle.

2 of these samples were obtained for AFIRMA.

Multiple ultrasound images were saved for procedural documentation
purposes. The samples were prepared and submitted to pathology.

Limited post procedural scanning was negative for hematoma or
additional complication. Dressings were placed. The patient
tolerated the above procedures procedure well without immediate
postprocedural complication.
FINDINGS: Nodule reference number based on prior diagnostic ultrasound: 1

Maximum size: 2.4 cm

Location: Right; Superior

ACR TI-RADS risk category: TR4 (4-6 points)

Reason for biopsy: meets ACR TI-RADS criteria

Ultrasound imaging confirms appropriate placement of the needles
within the thyroid nodule.
IMPRESSION: Technically successful ultrasound guided fine needle aspiration of
right superior thyroid nodule.

Read by

Bobakr Optimiste

## 2021-08-17 MED FILL — LIOTHYRONINE 5 MCG TABLET: 5 ug | ORAL | 30 days supply | Qty: 30 | Fill #1

## 2021-08-17 MED FILL — HYDROCHLOROTHIAZIDE 25 MG TABLET: 25 mg | ORAL | 30 days supply | Qty: 30 | Fill #1

## 2021-08-25 ENCOUNTER — Ambulatory Visit: Attending: CMA, LXMO | Primary: Family

## 2021-08-25 DIAGNOSIS — Z23 Encounter for immunization: Secondary | ICD-10-CM

## 2021-08-31 ENCOUNTER — Encounter: Payer: Self-pay | Admitting: Internal Medicine

## 2021-08-31 ENCOUNTER — Ambulatory Visit (INDEPENDENT_AMBULATORY_CARE_PROVIDER_SITE_OTHER): Payer: 59 | Admitting: Internal Medicine

## 2021-08-31 ENCOUNTER — Other Ambulatory Visit: Payer: Self-pay

## 2021-08-31 VITALS — BP 122/78 | HR 71 | Ht 62.0 in | Wt 148.8 lb

## 2021-08-31 DIAGNOSIS — Z8639 Personal history of other endocrine, nutritional and metabolic disease: Secondary | ICD-10-CM | POA: Diagnosis not present

## 2021-08-31 DIAGNOSIS — E039 Hypothyroidism, unspecified: Secondary | ICD-10-CM

## 2021-08-31 DIAGNOSIS — E041 Nontoxic single thyroid nodule: Secondary | ICD-10-CM

## 2021-08-31 LAB — T3, FREE: T3, Free: 2.8 pg/mL (ref 2.3–4.2)

## 2021-08-31 LAB — T4, FREE: Free T4: 0.9 ng/dL (ref 0.60–1.60)

## 2021-08-31 LAB — TSH: TSH: 1 u[IU]/mL (ref 0.35–5.50)

## 2021-08-31 MED ORDER — LIOTHYRONINE SODIUM 5 MCG PO TABS: 5.0000 ug | ORAL_TABLET | Freq: Every day | ORAL | 3 refills | Status: DC

## 2021-08-31 MED ORDER — LEVOTHYROXINE SODIUM 100 MCG PO TABS: 100.0000 ug | ORAL_TABLET | Freq: Every day | ORAL | 3 refills | Status: DC
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
## 2021-08-31 NOTE — Patient Instructions (Addendum)
Please continue: - Levothyroxine 100 mcg daily - Liothyronine 5 mcg daily  Please take the thyroid hormone every day, with water, at least 30 minutes before breakfast, separated by at least 4 hours from: - acid reflux medications - calcium - iron - multivitamins  Try to experiment with moving Cytomel later in the day.  Please return in 6 months.

## 2021-09-05 ENCOUNTER — Inpatient Hospital Stay: Admit: 2021-09-05 | Payer: PRIVATE HEALTH INSURANCE | Attending: Family | Primary: Family

## 2021-09-05 DIAGNOSIS — Z1231 Encounter for screening mammogram for malignant neoplasm of breast: Secondary | ICD-10-CM

## 2021-09-05 NOTE — Other (Signed)
Mammogram normal. Repeat in 1 year 

## 2021-09-11 MED ORDER — dextroamphetamine-amphetamine ER (ADDERALL XR) 20 MG 24 hr capsule
20 | ORAL_CAPSULE | Freq: Every morning | ORAL | 0 refills | Status: DC
Start: 2021-09-11 — End: 2021-11-15
  Filled 2021-09-21: qty 28, 28d supply, fill #0

## 2021-09-14 ENCOUNTER — Ambulatory Visit
Admission: RE | Admit: 2021-09-14 | Discharge: 2021-09-14 | Disposition: A | Discharge status: Home / Self Care | Payer: 59 | Source: Ambulatory Visit | Attending: Internal Medicine | Admitting: Internal Medicine

## 2021-09-14 DIAGNOSIS — Z8639 Personal history of other endocrine, nutritional and metabolic disease: Secondary | ICD-10-CM

## 2021-09-20 MED FILL — PROGESTERONE MICRONIZED 100 MG CAPSULE: 100 mg | ORAL | 30 days supply | Qty: 30 | Fill #1

## 2021-09-20 MED FILL — LIOTHYRONINE 5 MCG TABLET: 5 ug | ORAL | 30 days supply | Qty: 30 | Fill #2

## 2021-09-20 MED FILL — HYDROCHLOROTHIAZIDE 25 MG TABLET: 25 mg | ORAL | 30 days supply | Qty: 30 | Fill #2

## 2021-09-21 MED FILL — ROSUVASTATIN 5 MG TABLET: 5 mg | ORAL | 30 days supply | Qty: 30 | Fill #1

## 2021-10-26 NOTE — Telephone Encounter (Signed)
Will need to be seen.     As long as the palpitations are not sustained, it is something to watch and document what she is doing, what her heart rate it when it happens and how long do the episodes last.

## 2021-10-26 NOTE — Telephone Encounter (Signed)
From: [name]  To: [name], FNP  Sent: 10/26/2021 3:08 PM PST  Subject: Question    Good afternoon. I have a question for you. this last week in the afternoon i have been having small heart palpations. When this happens I am needing to cough. I am not sick nor have any fever. Im honestly not sure how concern I should be or just keep being aware of it.     thank you,    Kerry Allen

## 2021-11-01 ENCOUNTER — Ambulatory Visit: Admit: 2021-11-01 | Discharge: 2021-11-06 | Payer: PRIVATE HEALTH INSURANCE | Attending: Family | Primary: Family

## 2021-11-01 DIAGNOSIS — R002 Palpitations: Secondary | ICD-10-CM

## 2021-11-01 MED ORDER — chlorhexidine (PERIDEX) 0.12 % solution
0.12 | 0 refills | 16.00000 days | Status: AC
Start: 2021-11-01 — End: 2021-11-16
  Filled 2021-11-02: qty 473, 14d supply, fill #0

## 2021-11-01 MED ORDER — atenoloL (TENORMIN) 25 mg tablet
25 | ORAL_TABLET | Freq: Every day | ORAL | 1 refills | 90.00000 days | Status: DC
Start: 2021-11-01 — End: 2022-06-04
  Filled 2021-11-02: qty 90, 90d supply, fill #0

## 2021-11-01 MED ORDER — acetylcysteine (NAC) 600 mg Cap
600 | ORAL_CAPSULE | Freq: Two times a day (BID) | ORAL | 99 refills | Status: AC
Start: 2021-11-01 — End: ?

## 2021-11-01 MED ORDER — benzonatate (TESSALON PERLES) 100 MG capsule
100 | ORAL_CAPSULE | Freq: Three times a day (TID) | ORAL | 1 refills | Status: DC | PRN
Start: 2021-11-01 — End: 2022-06-04
  Filled 2021-11-02: qty 60, 20d supply, fill #0

## 2021-11-01 NOTE — Patient Instructions (Addendum)
Zio patch ordered today.    Check labs ordered today. Do not take vitamins or thyroid morning of blood draw.     Continue the atenolol 25 mg 1/2 tab daily. However, if heart rate is noted to be 120 or higher for greater than 20 min while resting, take the other 1/2 of the tab.     RX for peridex solution to see if this helps with the mouth sores.     Hold the adderall for the next couple of days.     Monitor your blood pressure when you are having palpitations.     Cardiopulm will call to set up the zio patch.     Refill of tessalon perles sent to the pharmacy for the cough.     Start back on NAC.

## 2021-11-01 NOTE — Progress Notes (Signed)
Kerry Allen is a 45 y.o. female seen today for Palpitations  .    Patient is alone. The Patient is the medical decision(s) maker attending today's appointment.    ASSESSMENT AND PLAN:  Problem List Items Addressed This Visit        Active Problems    Avitaminosis    Relevant Orders    Vitamin B12/Folate -Routine    Vitamin B2 -Routine    Vitamin B6 -Routine    Elevated C-reactive protein (CRP)    Relevant Orders    C-reactive Protein High Sensitivity -Routine    Hashimoto's thyroiditis    Relevant Medications    atenoloL (TENORMIN) 25 MG tablet    Other Relevant Orders    Thyroid Stimulating Hormone -Routine    T4, Free -Routine   Other Visit Diagnoses     Intermittent palpitations    -  Primary    Relevant Medications    atenoloL (TENORMIN) 25 MG tablet    Other Relevant Orders    Ziopatch XT    Iron & Total Iron Binding Capacity -Routine    Ferritin -Routine    Magnesium -Routine    Cough, unspecified type        Relevant Medications    benzonatate (TESSALON PERLES) 100 MG capsule    Mouth sores        Relevant Medications    chlorhexidine (PERIDEX) 0.12 % solution    Other Relevant Orders    Erythrocyte Sedimentation Rate, Automated -Routine    Family history of diabetes mellitus        Relevant Orders    Glyco-Hemoglobin A1C -Routine          Plan:     Patient Instructions   Zio patch ordered today.    Check labs ordered today. Do not take vitamins or thyroid morning of blood draw.     Continue the atenolol 25 mg 1/2 tab daily. However, if heart rate is noted to be 120 or higher for greater than 20 min while resting, take the other 1/2 of the tab.     RX for peridex solution to see if this helps with the mouth sores.     Hold the adderall for the next couple of days.     Monitor your blood pressure when you are having palpitations.     Cardiopulm will call to set up the zio patch.     Refill of tessalon perles sent to the pharmacy for the cough.     Start back on NAC.         Support for the above plan  obtained during this visit:    HPI  Palpitations  Of and on for the past couple of weeks has been having  palpitations.  The symptoms are of mild and transient severity, occuring intermittently and lasting a few seconds per episode. Cardiac risk factors include: family history of premature cardiovascular disease and obesity (BMI >= 30 kg/m2). Aggravating factors: stress/anxiety. Relieving factors: cough. Associated signs and symptoms: has no complaint(s) of chest pressure/discomfort-but, no pain/     130-140s/80s- states that she is having episodes a few seconds long and states that she feels like her heart stop/squeeze and then start back again. States that she gets dizzy when the episodes occur.    ALLERGIES:  Allergies   Allergen Reactions   . Penicillins Rash     When she was a child       Outpatient Medications Marked as Taking for  the 11/01/21 encounter (Office Visit) with [name], FNP   Medication Sig Dispense Refill   . b complex vitamins capsule One Elevated Methylfolate+ (B-Complex) 1 cap every morning     . cholecalciferol, vitamin D3, 125 mcg (5,000 unit) capsule Take 5,000 IU daily and Monday, Wednesday and Friday take 16109 IU. 100 capsule prn   . dextroamphetamine-amphetamine ER (ADDERALL XR) 20 MG 24 hr capsule Take 1 capsule by mouth every morning for 28 days 28 capsule 0   . hydroCHLOROthiazide (HYDRODIURIL) 25 MG tablet Take 1 tablet by mouth daily for hypertension. 30 tablet 2   . INOSITOL, BULK, MISC by Miscellaneous route.     Marland Kitchen levomefolate calcium (L-METHYLFOLATE) 10 mg Cap capsule Take 10 mg daily 90 capsule    . levothyroxine (SYNTHROID) 150 MCG tablet Take 1 tablet by mouth daily. 90 tablet 1   . liothyronine (CYTOMEL) 5 MCG tablet Take 1 tablet by mouth daily. 30 tablet 2   . omega-3 fatty acids 1,000 mg Cap Take 1 capsule nightly with largest meal 180 each 1   . progesterone (PROMETRIUM) 100 MG capsule Take 1 capsule by mouth daily for hormone replacement therapy 30 capsule 2    . rosuvastatin (CRESTOR) 5 MG tablet Take 1 tablet by mouth daily. 30 tablet 2   . atenoloL (TENORMIN) 25 MG tablet Take 1/2 tablet by mouth daily. 30 tablet 2       VITALS:  Vitals:    11/01/21 1435   BP: 128/78   BP Location: Left arm   Patient Position: Sitting   Pulse: 88   Temp: 36.7 ?C (98.1 ?F)   TempSrc: Oral   SpO2: 91%   Weight: 167 lb 6.4 oz (75.9 kg)   Height: 5' 2 (1.575 m)     Body mass index is 30.62 kg/m?Marland Kitchen  No LMP recorded. Patient has had an ablation.    Ideal body weight: 110 lb 7.2 oz (50.1 kg)  Adjusted ideal body weight: 133 lb 3.7 oz (60.4 kg)     Wt Readings from Last 3 Encounters:   11/01/21 167 lb 6.4 oz (75.9 kg)   12/22/20 165 lb (74.8 kg)   09/13/20 166 lb 6.4 oz (75.5 kg)       Review of Systems   Constitutional: Positive for fatigue. Negative for chills and fever.   HENT: Positive for mouth sores.         Pain on side of tongue   Respiratory: Positive for cough and chest tightness. Negative for shortness of breath.    Cardiovascular: Positive for palpitations.   Neurological: Positive for headaches (pressure in back of head, tylenol/ibu seem to help).     Also see HPI for ROS    Physical Exam  Constitutional:       General: She is not in acute distress.     Appearance: She is well-developed.   Cardiovascular:      Rate and Rhythm: Normal rate and regular rhythm.   Pulmonary:      Effort: Pulmonary effort is normal.      Breath sounds: Normal breath sounds.   Musculoskeletal:         General: Normal range of motion.   Skin:     General: Skin is warm and dry.   Neurological:      Mental Status: She is alert and oriented to person, place, and time.       Items reviewed in preparation for this visit:  The following portions of the  patient's history were reviewed and updated as appropriate: allergies, current medications, past family history, past medical history, past surgical history and problem list.  When necessary and appropriate, prior medical records, medical history from family  members or other providers were reviewed for coordination of care.     LABWORK COMPLETED PRIOR TO THIS VISIT: none      RADIOLOGY STUDIES COMPLETED PRIOR TO THIS VISIT:  none      Health Maintenance Due  Health Maintenance Due   Topic Date Due   . Cervical  05/29/2018       Past Medical History:   Diagnosis Date   . Abnormal ultrasound 05/13/2017   . Acute non-recurrent maxillary sinusitis 10/06/2020   . hx of Graves' disease 2002    s/p I131  in 2002   . Postablative hypothyroidism     Hypothyroidism-Dr. Leonia Reader 2002   . Vitamin D deficiency      Past Surgical History:   Procedure Laterality Date   . ABDOMINAL SURGERY     . APPENDECTOMY     . AUGMENTATION MAMMAPLASTY Bilateral 2020   . CERVICAL BIOPSY     . PANNICULECTOMY  2005    tummy tuck   . PROCEDURE Right 06/27/2017    Procedure: LAPAROSCOPY;  ADHESIOLYSIS; ARGON BEAM TREATMENT OF EXTENSIVE ENDOMETRIOSIS;  Surgeon: Rolene Arbour, MD;  Location: [facility] OR;  Service: Gynecology;  Laterality: Right;   . PROCEDURE N/A 11/08/2017    Procedure: COLONOSCOPY with sedation;  Surgeon: Olin Pia, MD;  Location: [facility] ENDO;  Service: Endoscopy;  Laterality: N/A;   . PROCEDURE Right 09/18/2018    Procedure: LAPAROSCOPY; RIGHTSALPINGO-OOPHORECTOMY; APPENDECTOMY;  LYSIS OF PERIADENEXAL ADHESIONS;  Surgeon: Rolene Arbour, MD;  Location: [facility] OR;  Service: Gynecology;  Laterality: Right;   . TUBAL LIGATION      Tubal ligation 2008     Family History   Problem Relation Age of Onset   . Diabetes Mother    . Heart attack Mother         during a heart cath   . Lung cancer Mother         stage III   . Tobacco Dependence Mother    . Heart attack Father    . Tobacco Dependence Father    . No Known Health Problems Son    . Diabetes type II Maternal Grandmother    . Hypertension Maternal Grandmother    . Diabetes type II Paternal Grandmother    . Hypertension Paternal Grandmother    . Coronary artery disease Paternal Grandfather    . Heart attack Paternal Grandfather    . No Known  Health Problems Son    . Breast cancer Neg Hx    . Ovarian cancer Neg Hx       Social History     Socioeconomic History   . Marital status: Married     Spouse name: Jerrod   . Number of children: 2   Tobacco Use   . Smoking status: Never   . Smokeless tobacco: Never   . Tobacco comments:     passive smoke exposure   Substance and Sexual Activity   . Alcohol use: Yes     Alcohol/week: 0.0 standard drinks     Comment: occasional    . Drug use: No   . Sexual activity: Yes     Partners: Male     Birth control/protection: Surgical     Comment: Tubal Ligation   Other Topics Concern   .  Caffeine Concern Yes     Comment: moderate   . Exercise Yes     Comment: occasional        I had a thoughtful and careful discussion regarding the issues today. All questions were answered. I educated and reassured. I encouraged followup as needed. Hand written instructions were given via the After Visit Summary including reference literature as appropriate. Today's medical decision maker  verbalize understanding of the treatment plan and are in agreement with the treatment plan goals. Medical decision maker is aware of how to contact clinical provider if concerns arise.    FOLLOW-UP:  No follow-ups on file.   No future appointments.    Greater than 50 % of visit spent in counseling/coordination of care.  Time spent with patient in discussion and coordination of patient's care: 20 min    This note was transcribed using speech recognition software. Please contact us for clarification if any questions arise relating to the wording of this document.

## 2021-11-02 ENCOUNTER — Other Ambulatory Visit: Admit: 2021-11-02 | Discharge: 2021-11-02 | Payer: PRIVATE HEALTH INSURANCE | Primary: Family

## 2021-11-02 DIAGNOSIS — E569 Vitamin deficiency, unspecified: Secondary | ICD-10-CM

## 2021-11-02 LAB — IRON AND TIBC
Iron Saturation: 11 % — ABNORMAL LOW (ref 18–54)
Iron: 36 g/dL — ABNORMAL LOW (ref 40–160)
Total Iron Binding Capacity: 315 g/dL (ref 261–471)
Transferrin: 220 mg/dL (ref 200–362)

## 2021-11-02 LAB — VITAMIN B2: Vitamin B2: 3 mcg/L (ref 1–19)

## 2021-11-02 LAB — TSH: TSH - Thyroid Stimulating Hormone: 0.49 ??IU/mL (ref 0.45–5.33)

## 2021-11-02 LAB — GLYCO-HEMOGLOBIN A1C
Estimated Average Glucose: 111 mg/dL
Glycohemoglobin (A1c): 5.5 % (ref 4.3–6.1)

## 2021-11-02 LAB — MAGNESIUM: Magnesium: 2 mg/dL (ref 1.6–2.4)

## 2021-11-02 LAB — T4, FREE: T4, Free: 1.2 ng/dL (ref 0.6–1.2)

## 2021-11-02 LAB — FERRITIN: Ferritin: 37 ng/mL (ref 11–200)

## 2021-11-02 LAB — VITAMIN B12/FOLATE
Folate: 6.4 ng/mL (ref 5.9–24.8)
Vitamin B12: 849 pg/mL (ref 180–914)

## 2021-11-02 LAB — ERYTHROCYTE SEDIMENTATION RATE, AUTOMATED: Erythrocyte Sedimentation Rate, Automated: 21 mm/hr — ABNORMAL HIGH (ref 0–20)

## 2021-11-02 LAB — VITAMIN B6: Vitamin B6 (Pyridoxal 5-Phosphate): 5 mcg/L (ref 5–50)

## 2021-11-02 LAB — C-REACTIVE PROTEIN (HIGH SENSITIVITY): C-Reactive Protein High Sensitivity: 10.04 mg/L — ABNORMAL HIGH (ref <=3.00)

## 2021-11-02 MED FILL — ACETYLCYSTEINE 600 MG CAPSULE: 600 mg | ORAL | 90 days supply | Qty: 180 | Fill #0

## 2021-11-03 ENCOUNTER — Encounter: Admit: 2021-11-03 | Payer: PRIVATE HEALTH INSURANCE | Primary: Family

## 2021-11-03 DIAGNOSIS — R002 Palpitations: Secondary | ICD-10-CM

## 2021-11-03 NOTE — Progress Notes (Signed)
Zio patch hooked up.   Process was explained to the pt, and they were given the opportunity to ask questions.     SN# [id number]

## 2021-11-14 ENCOUNTER — Ambulatory Visit: Admit: 2021-11-14 | Discharge: 2021-12-05 | Payer: PRIVATE HEALTH INSURANCE | Attending: Family | Primary: Family

## 2021-11-14 DIAGNOSIS — D508 Other iron deficiency anemias: Secondary | ICD-10-CM

## 2021-11-14 NOTE — Progress Notes (Signed)
Kerry Allen is a 46 y.o. female seen today for Review Lab Results  .    Patient is alone. The Patient is the medical decision(s) maker attending today's appointment.    ASSESSMENT AND PLAN:  Problem List Items Addressed This Visit        Active Problems    Avitaminosis    Iron deficiency anemia secondary to inadequate dietary iron intake - Primary    Screening for depression       Plan:     Patient Instructions   Recommend starting on healthy brain immune B complex vitamin 1 capsule by mouth daily.    Start on Naturelo iron with vitamin C.  Take 2 caps daily x 1 month, then can go back to 1 per day.     Trial of pepcid 40 mg daily for the tickle in the back of throat.       Support for the above plan obtained during this visit:    HPI  Lab Results  Pt here to discuss the following lab results as listed below. Pt educated regarding the findings and treatment plan developed.       ALLERGIES:  Allergies   Allergen Reactions   . Penicillins Rash     When she was a child       Outpatient Medications Marked as Taking for the 11/14/21 encounter (Office Visit) with [name], FNP   Medication Sig Dispense Refill   . acetylcysteine (NAC) 600 mg Cap Take 600 mg by mouth 2 times daily. 180 capsule PRN   . atenoloL (TENORMIN) 25 MG tablet Take 1/2 to 1 tablet by mouth once daily for palpitations. 90 tablet 1   . b complex vitamins capsule One Elevated Methylfolate+ (B-Complex) 1 cap every morning     . benzonatate (TESSALON PERLES) 100 MG capsule Take 1 capsule by mouth 3 times daily as needed for Cough. 60 capsule 1   . [EXPIRED] chlorhexidine (PERIDEX) 0.12 % solution Swish for 30 seconds with 15 mL (one capful) after toothbrushing then expectorate; repeat twice daily (morning and evening). 473 mL 0   . cholecalciferol, vitamin D3, 125 mcg (5,000 unit) capsule Take 5,000 IU daily and Monday, Wednesday and Friday take 16109 IU. 100 capsule prn   . INOSITOL, BULK, MISC by Miscellaneous route.     Marland Kitchen levomefolate  calcium (L-METHYLFOLATE) 10 mg Cap capsule Take 10 mg daily 90 capsule    . liothyronine (CYTOMEL) 5 MCG tablet Take 1 tablet by mouth daily. 30 tablet 2   . omega-3 fatty acids 1,000 mg Cap Take 1 capsule nightly with largest meal 180 each 1   . progesterone (PROMETRIUM) 100 MG capsule Take 1 capsule by mouth daily for hormone replacement therapy 30 capsule 2   . rosuvastatin (CRESTOR) 5 MG tablet Take 1 tablet by mouth daily. 30 tablet 2   . [DISCONTINUED] dextroamphetamine-amphetamine ER (ADDERALL XR) 20 MG 24 hr capsule Take 1 capsule by mouth every morning for 28 days 28 capsule 0   . [DISCONTINUED] hydroCHLOROthiazide (HYDRODIURIL) 25 MG tablet Take 1 tablet by mouth daily for hypertension. 30 tablet 2   . [DISCONTINUED] levothyroxine (SYNTHROID) 150 MCG tablet Take 1 tablet by mouth daily. 90 tablet 1       VITALS:  Vitals:    11/14/21 1303   BP: 122/88   BP Location: Left arm   Patient Position: Sitting   Pulse: 74   Temp: 36.8 ?C (98.3 ?F)   TempSrc: Oral  SpO2: 99%   Weight: 169 lb 12.8 oz (77 kg)   Height: 5' 2 (1.575 m)     Body mass index is 31.06 kg/m?Marland Kitchen  No LMP recorded. Patient has had an ablation.    Ideal body weight: 110 lb 7.2 oz (50.1 kg)  Adjusted ideal body weight: 134 lb 3 oz (60.9 kg)     Wt Readings from Last 3 Encounters:   11/14/21 169 lb 12.8 oz (77 kg)   11/01/21 167 lb 6.4 oz (75.9 kg)   12/22/20 165 lb (74.8 kg)       Review of Systems   Constitutional: Negative for chills, fatigue and fever.        Fatigue, restless leg, hair loss   Respiratory: Negative for cough and shortness of breath.    Cardiovascular: Negative for chest pain and palpitations.   Hematological: Bruises/bleeds easily.   All other systems reviewed and are negative.      Also see HPI for ROS    Physical Exam  Constitutional:       General: She is not in acute distress.     Appearance: She is well-developed.   Cardiovascular:      Rate and Rhythm: Normal rate and regular rhythm.   Pulmonary:      Effort: Pulmonary  effort is normal.      Breath sounds: Normal breath sounds.   Musculoskeletal:         General: Normal range of motion.   Skin:     General: Skin is warm and dry.      Capillary Refill: Capillary refill takes less than 2 seconds.      Findings: Bruising (Couple of small bruises noted, thinning hair) present.   Neurological:      Mental Status: She is alert and oriented to person, place, and time. Mental status is at baseline.       Items reviewed in preparation for this visit:  The following portions of the patient's history were reviewed and updated as appropriate: allergies, current medications, past family history, past medical history, past surgical history and problem list.  When necessary and appropriate, prior medical records, medical history from family members or other providers were reviewed for coordination of care.     LABWORK COMPLETED PRIOR TO THIS VISIT:  Lab Walk-In on 11/02/2021   Component Date Value Ref Range Status   . Vitamin B2 11/02/2021 3  1 - 19 mcg/L Final   . Vitamin B6 (Pyridoxal 5-Phosphate) 11/02/2021 5  5 - 50 mcg/L Final   . Vitamin B12 11/02/2021 849  180 - 914 pg/mL Final   . Folate 11/02/2021 6.4  5.9 - 24.8 ng/mL Final   . Glycohemoglobin (A1c) 11/02/2021 5.5  4.3 - 6.1 % Final   . Estimated Average Glucose 11/02/2021 111  mg/dL Final   . Magnesium 16/08/9603 2.0  1.6 - 2.4 mg/dL Final   . C-Reactive Protein High Sensitivity 11/02/2021 10.04 (H)  <=3.00 mg/L Final   . Erythrocyte Sedimentation Rate, Au* 11/02/2021 21 (H)  0 - 20 mm/hr Final   . Ferritin 11/02/2021 37  11 - 200 ng/mL Final   . Iron 11/02/2021 36 (L)  40 - 160 ?g/dL Final   . Iron Saturation 11/02/2021 11 (L)  18 - 54 % Final   . Transferrin 11/02/2021 220  200 - 362 mg/dL Final   . Total Iron Binding Capacity 11/02/2021 315  261 - 471 ?g/dL Final   . T4, Free 54/07/8118 1.2  0.6 - 1.2 ng/dL Final   . TSH - Thyroid Stimulating Hormone 11/02/2021 0.49  0.45 - 5.33 ?IU/mL Final       RADIOLOGY STUDIES COMPLETED PRIOR  TO THIS VISIT:    Health Maintenance Due  There are no preventive care reminders to display for this patient.    Past Medical History:   Diagnosis Date   . Abnormal ultrasound 05/13/2017   . Acute non-recurrent maxillary sinusitis 10/06/2020   . hx of Graves' disease 2002    s/p I131  in 2002   . Postablative hypothyroidism     Hypothyroidism-Dr. Leonia Reader 2002   . Vitamin D deficiency      Past Surgical History:   Procedure Laterality Date   . ABDOMINAL SURGERY     . APPENDECTOMY     . AUGMENTATION MAMMAPLASTY Bilateral 2020   . CERVICAL BIOPSY     . PANNICULECTOMY  2005    tummy tuck   . PROCEDURE Right 06/27/2017    Procedure: LAPAROSCOPY;  ADHESIOLYSIS; ARGON BEAM TREATMENT OF EXTENSIVE ENDOMETRIOSIS;  Surgeon: Rolene Arbour, MD;  Location: [facility] OR;  Service: Gynecology;  Laterality: Right;   . PROCEDURE N/A 11/08/2017    Procedure: COLONOSCOPY with sedation;  Surgeon: Olin Pia, MD;  Location: [facility] ENDO;  Service: Endoscopy;  Laterality: N/A;   . PROCEDURE Right 09/18/2018    Procedure: LAPAROSCOPY; RIGHTSALPINGO-OOPHORECTOMY; APPENDECTOMY;  LYSIS OF PERIADENEXAL ADHESIONS;  Surgeon: Rolene Arbour, MD;  Location: [facility] OR;  Service: Gynecology;  Laterality: Right;   . TUBAL LIGATION      Tubal ligation 2008     Family History   Problem Relation Age of Onset   . Diabetes Mother    . Heart attack Mother         during a heart cath   . Lung cancer Mother         stage III   . Tobacco Dependence Mother    . Heart attack Father    . Tobacco Dependence Father    . No Known Health Problems Son    . Diabetes type II Maternal Grandmother    . Hypertension Maternal Grandmother    . Diabetes type II Paternal Grandmother    . Hypertension Paternal Grandmother    . Coronary artery disease Paternal Grandfather    . Heart attack Paternal Grandfather    . No Known Health Problems Son    . Breast cancer Neg Hx    . Ovarian cancer Neg Hx       Social History     Socioeconomic History   . Marital status: Married     Spouse name:  Jerrod   . Number of children: 2   Tobacco Use   . Smoking status: Never   . Smokeless tobacco: Never   . Tobacco comments:     passive smoke exposure   Substance and Sexual Activity   . Alcohol use: Yes     Alcohol/week: 0.0 standard drinks     Comment: occasional    . Drug use: No   . Sexual activity: Yes     Partners: Male     Birth control/protection: Surgical     Comment: Tubal Ligation   Other Topics Concern   . Caffeine Concern Yes     Comment: moderate   . Exercise Yes     Comment: occasional        I had a thoughtful and careful discussion regarding the issues today. All questions were answered. I educated  and reassured. I encouraged followup as needed. Hand written instructions were given via the After Visit Summary including reference literature as appropriate. Today's medical decision maker  verbalize understanding of the treatment plan and are in agreement with the treatment plan goals. Medical decision maker is aware of how to contact clinical provider if concerns arise.    FOLLOW-UP:     Future Appointments   Date Time Provider Department Center   12/12/2021 12:00 PM [name], FNP [id number]     Greater than 50% of visit spent in counseling/coordination of care.    Time spent with patient in discussion and coordination of patient's care: 25 min    This note was transcribed using speech recognition software. Please contact us for clarification if any questions arise relating to the wording of this document.

## 2021-11-14 NOTE — Patient Instructions (Addendum)
Recommend starting on healthy brain immune B complex vitamin 1 capsule by mouth daily.  This vitamin contains adequate levels of B vitamins and includes 10 mg of l-methylfolate and 1500 mcg of methylcobalamin already in the capsule.  This vitamin also contains zinc, selenium and N-acetylcysteine which helps supports the immune system, enhances stress resilience, supports energy production and brain function. You can purchase this vitamin supplement through the following website: https://healthy-brain.ai/        Start on Naturelo iron with vitamin C. Iron is best absorbed in the presence of vitamin C. This is also a formulation of iron that should not contribute to constipation or turning stools dark. Take 2 caps daily x 1 month, then can go back to 1 per day.         Trial of pepcid 40 mg daily for the tickle in the back of throat.

## 2021-11-15 MED ORDER — levothyroxine (SYNTHROID) 150 mcg tablet
150 | ORAL_TABLET | Freq: Every day | ORAL | 1 refills | Status: DC
Start: 2021-11-15 — End: 2022-06-04
  Filled 2021-11-17: qty 90, 90d supply, fill #0

## 2021-11-15 MED ORDER — hydroCHLOROthiazide (HYDRODIURIL) 25 mg tablet
25 | ORAL_TABLET | Freq: Every day | ORAL | 2 refills | Status: DC
Start: 2021-11-15 — End: 2022-03-23
  Filled 2021-11-17: qty 30, 30d supply, fill #0

## 2021-11-15 MED ORDER — dextroamphetamine-amphetamine ER (ADDERALL XR) 20 MG 24 hr capsule
20 | ORAL_CAPSULE | Freq: Every morning | ORAL | 0 refills | Status: DC
Start: 2021-11-15 — End: 2022-06-04
  Filled 2021-11-17: qty 28, 28d supply, fill #0

## 2021-11-15 NOTE — Telephone Encounter (Signed)
LOV - 11/15/21  NOV - 0

## 2021-11-16 NOTE — Procedures (Signed)
DATE OF PROCEDURE:  11/03/2021    ZIO PATCH MONITOR    INDICATIONS:  The patient is a 46 year old female who was set up for an   outpatient monitor to evaluate for palpitations.  `  FINDINGS:  She was set up for an outpatient monitor on 11/03/2021, of which 4   days and 16 hours were interpretable once artifact was removed.  Her average   heart rate was 78 with a range between 57--130 beats per minute.  There were no   sustained atrial or ventricular arrhythmias.  There were no pauses over 3   seconds.  There were no concerning episodes of AV block.  There was no evidence   for atrial fibrillation.  She did have rare atrial and ventricular ectopy.  Her   one triggered event correlated with sinus rhythm.  There was some artifact   recorded, which could affect interpretation.    CONCLUSIONS:  1.  No sustained atrial or ventricular arrhythmias.  2.  No pauses over 3 seconds.  3.  No concerning episodes of atrioventricular block.  4.  No definitive evidence for atrial fibrillation.  5.  Rare atrial and ventricular ectopy.    Dario Ave, MD    MS/gan/ree  D:  11/16/2021  1:08 P   T:  11/16/2021  6:27 P   TID:  [id number]  RECEIPT:    [id number]

## 2021-11-17 MED FILL — ROSUVASTATIN 5 MG TABLET: 5 mg | ORAL | 30 days supply | Qty: 30 | Fill #2

## 2021-11-17 MED FILL — PROGESTERONE MICRONIZED 100 MG CAPSULE: 100 mg | ORAL | 30 days supply | Qty: 30 | Fill #2

## 2021-11-23 ENCOUNTER — Encounter: Payer: PRIVATE HEALTH INSURANCE | Attending: Family | Primary: Family

## 2021-12-03 DIAGNOSIS — D508 Other iron deficiency anemias: Secondary | ICD-10-CM

## 2021-12-11 MED ORDER — rosuvastatin (CRESTOR) 5 mg tablet
5 | ORAL_TABLET | Freq: Every day | ORAL | 0 refills | Status: DC
Start: 2021-12-11 — End: 2022-06-04
  Filled 2021-12-19: qty 90, 90d supply, fill #0

## 2021-12-11 MED ORDER — liothyronine (CYTOMEL) 5 mcg tablet
5 | ORAL_TABLET | Freq: Every day | ORAL | 3 refills | 90.00000 days | Status: DC
Start: 2021-12-11 — End: 2022-06-04
  Filled 2021-12-18: qty 30, 30d supply, fill #0

## 2021-12-11 MED ORDER — progesterone (PROMETRIUM) 100 mg capsule
100 | ORAL_CAPSULE | Freq: Every day | ORAL | 2 refills | 90.00000 days | Status: DC
Start: 2021-12-11 — End: 2022-06-04
  Filled 2021-12-18: qty 30, 30d supply, fill #0

## 2021-12-12 ENCOUNTER — Encounter: Payer: PRIVATE HEALTH INSURANCE | Attending: Family | Primary: Family

## 2021-12-18 MED FILL — HYDROCHLOROTHIAZIDE 25 MG TABLET: 25 mg | ORAL | 30 days supply | Qty: 30 | Fill #1

## 2022-02-19 MED FILL — LIOTHYRONINE 5 MCG TABLET: 5 ug | ORAL | 30 days supply | Qty: 30 | Fill #1

## 2022-02-19 MED FILL — PROGESTERONE MICRONIZED 100 MG CAPSULE: 100 mg | ORAL | 30 days supply | Qty: 30 | Fill #1

## 2022-02-19 MED FILL — HYDROCHLOROTHIAZIDE 25 MG TABLET: 25 mg | ORAL | 30 days supply | Qty: 30 | Fill #2

## 2022-03-01 ENCOUNTER — Ambulatory Visit: Payer: 59 | Admitting: Internal Medicine

## 2022-03-15 ENCOUNTER — Encounter: Payer: Self-pay | Admitting: Internal Medicine

## 2022-03-15 ENCOUNTER — Ambulatory Visit (INDEPENDENT_AMBULATORY_CARE_PROVIDER_SITE_OTHER): Payer: 59 | Admitting: Internal Medicine

## 2022-03-15 VITALS — BP 118/80 | HR 82 | Ht 62.0 in | Wt 157.4 lb

## 2022-03-15 DIAGNOSIS — E890 Postprocedural hypothyroidism: Secondary | ICD-10-CM | POA: Diagnosis not present

## 2022-03-15 DIAGNOSIS — E041 Nontoxic single thyroid nodule: Secondary | ICD-10-CM | POA: Diagnosis not present

## 2022-03-15 DIAGNOSIS — E039 Hypothyroidism, unspecified: Secondary | ICD-10-CM

## 2022-03-15 LAB — T3, FREE: T3, Free: 2.9 pg/mL (ref 2.3–4.2)

## 2022-03-15 LAB — T4, FREE: Free T4: 0.87 ng/dL (ref 0.60–1.60)

## 2022-03-15 LAB — TSH: TSH: 0.67 u[IU]/mL (ref 0.35–5.50)
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
## 2022-03-15 NOTE — Patient Instructions (Addendum)
Please continue: ?- Levothyroxine 100 mcg daily ?- Liothyronine 5 mcg daily ? ?Please take the thyroid hormone every day, with water, at least 30 minutes before breakfast, separated by at least 4 hours from: ?- acid reflux medications ?- calcium ?- iron ?- multivitamins ? ?Please return in 1 year. ? ? ?

## 2022-03-23 MED ORDER — hydroCHLOROthiazide (HYDRODIURIL) 25 mg tablet
25 | ORAL_TABLET | Freq: Every day | ORAL | 2 refills | Status: DC
Start: 2022-03-23 — End: 2022-06-04
  Filled 2022-03-27: qty 30, 30d supply, fill #0

## 2022-03-26 MED FILL — ROSUVASTATIN 5 MG TABLET: 5 mg | ORAL | 90 days supply | Qty: 90 | Fill #1

## 2022-03-26 MED FILL — ACETYLCYSTEINE 600 MG CAPSULE: 600 mg | ORAL | 90 days supply | Qty: 180 | Fill #1

## 2022-03-27 MED FILL — PROGESTERONE MICRONIZED 100 MG CAPSULE: 100 mg | ORAL | 30 days supply | Qty: 30 | Fill #2

## 2022-03-27 MED FILL — ATENOLOL 25 MG TABLET: 25 mg | ORAL | 90 days supply | Qty: 90 | Fill #1

## 2022-03-27 MED FILL — LEVOTHYROXINE 150 MCG TABLET: 150 ug | ORAL | 90 days supply | Qty: 90 | Fill #1

## 2022-03-27 MED FILL — LIOTHYRONINE 5 MCG TABLET: 5 ug | ORAL | 30 days supply | Qty: 30 | Fill #2

## 2022-04-16 NOTE — Telephone Encounter (Signed)
From: [name]  To: [name], FNP  Sent: 04/03/2022 5:26 AM PDT  Subject: Labs    I have gained about 10lb in the last month. I was wondering if I can labs done to see if something has changed.    Thank u

## 2022-04-18 NOTE — Telephone Encounter (Signed)
Labs are ordered. Please let her know. No thyroid/vitamins morning of lab draw and needs to be fasting.

## 2022-05-07 ENCOUNTER — Other Ambulatory Visit: Admit: 2022-05-07 | Discharge: 2022-05-07 | Payer: BC Managed Care – PPO | Primary: Family

## 2022-05-07 DIAGNOSIS — R635 Abnormal weight gain: Secondary | ICD-10-CM

## 2022-05-07 LAB — BASIC METABOLIC PANEL
Anion Gap: 9 mmol/L (ref 4–13)
BUN: 15 mg/dL (ref 6–23)
CO2 - Carbon Dioxide: 27 mmol/L (ref 21–31)
Calcium: 8.9 mg/dL (ref 8.6–10.3)
Chloride: 106 mmol/L (ref 98–111)
Creatinine: 0.81 mg/dL (ref 0.55–1.10)
Glomerular Filtration Rate Estimate (Female): >60 mL/min/{1.73_m2} (ref >=60)
Glucose: 92 mg/dL (ref 80–99)
Potassium: 4.1 mmol/L (ref 3.5–5.1)
Sodium: 142 mmol/L (ref 135–143)

## 2022-05-07 LAB — C-REACTIVE PROTEIN: CRP: 6.4 mg/L — ABNORMAL HIGH (ref <5.00)

## 2022-05-07 LAB — VITAMIN B6: Vitamin B6 (Pyridoxal 5-Phosphate): 4 mcg/L — ABNORMAL LOW (ref 5–50)

## 2022-05-07 LAB — VITAMIN B12/FOLATE
Folate: 17.7 ng/mL (ref 5.9–24.8)
Vitamin B12: 856 pg/mL (ref 180–914)

## 2022-05-07 LAB — ESTRADIOL: Estradiol: 57.6 pg/mL

## 2022-05-07 LAB — PROGESTERONE: Progesterone: 0.6 ng/mL (ref <50.8)

## 2022-05-07 LAB — T4, FREE: T4, Free: 0.8 ng/dL (ref 0.6–1.2)

## 2022-05-07 LAB — INSULIN, TOTAL: Insulin: 4.7 ÂµIU/mL (ref 2.0–23.0)

## 2022-05-07 LAB — TSH: TSH - Thyroid Stimulating Hormone: 1.6 ÂµIU/mL (ref 0.45–5.33)

## 2022-05-07 LAB — VITAMIN B2: Vitamin B2: 3 mcg/L (ref 1–19)

## 2022-05-07 LAB — ERYTHROCYTE SEDIMENTATION RATE, AUTOMATED: Erythrocyte Sedimentation Rate, Automated: 16 mm/hr (ref 0–20)

## 2022-05-09 IMAGING — US US THYROID
1 series · 14 of 25 positions shown · non-contrast
Comparison: 12/12/2020, prior biopsy 12/21/2020

CLINICAL DATA: 45-year-old female with thyroid nodules

EXAM:
THYROID ULTRASOUND
TECHNIQUE: Ultrasound examination of the thyroid gland and adjacent soft
tissues was performed.

[Series 1: us thyroid · 0.04mm/px · 14 of 27 slices shown]
[im 1/27]
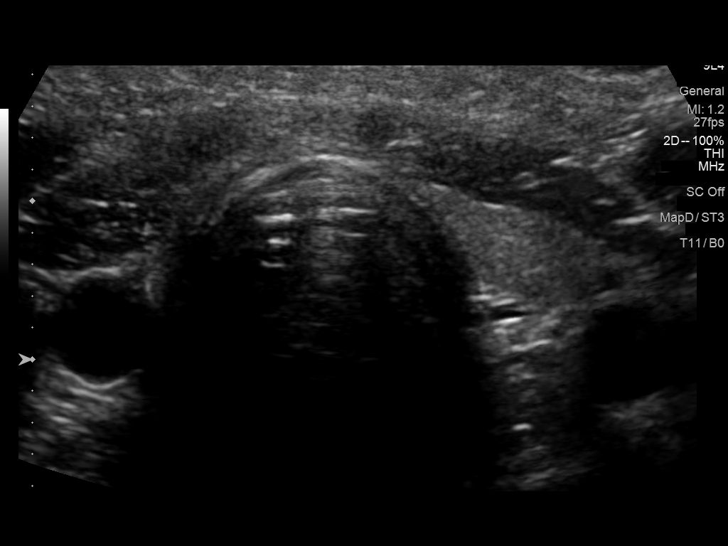
[im 3/27]
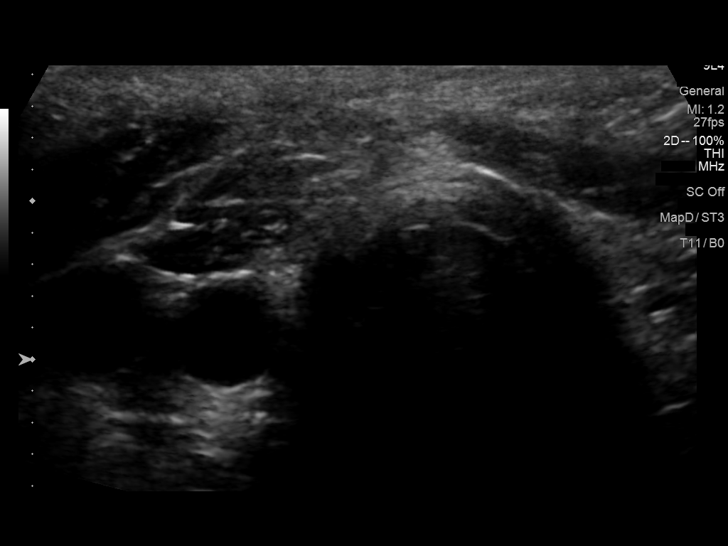
[im 5/27]
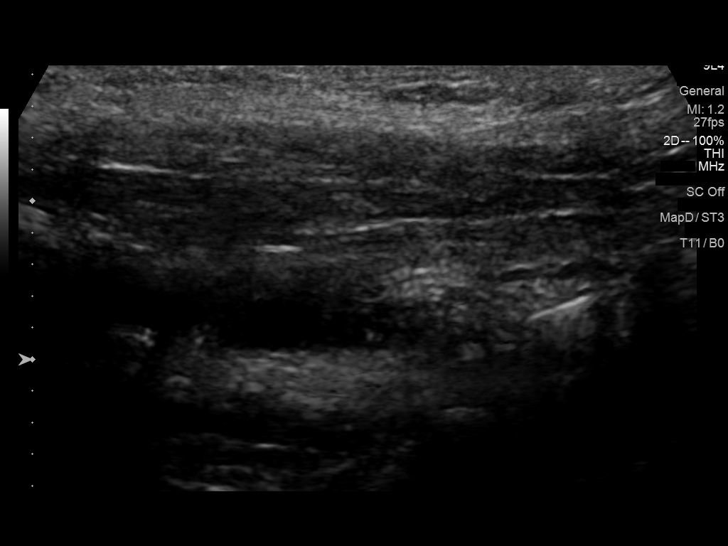
[im 7/27]
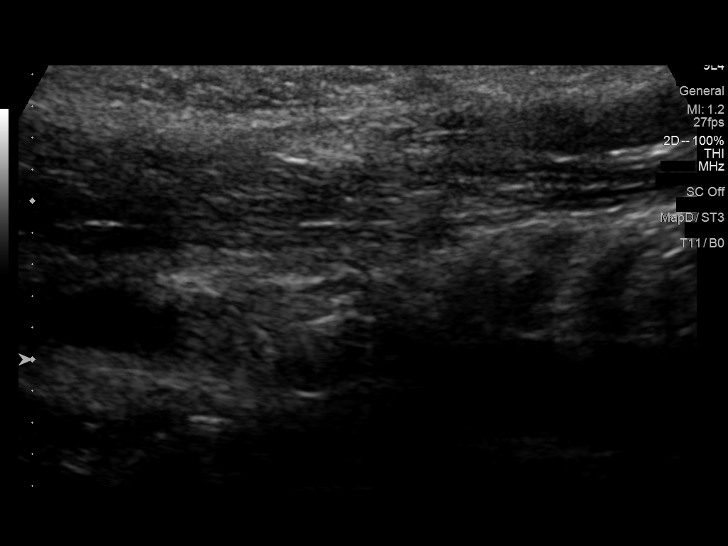
[im 9/27]
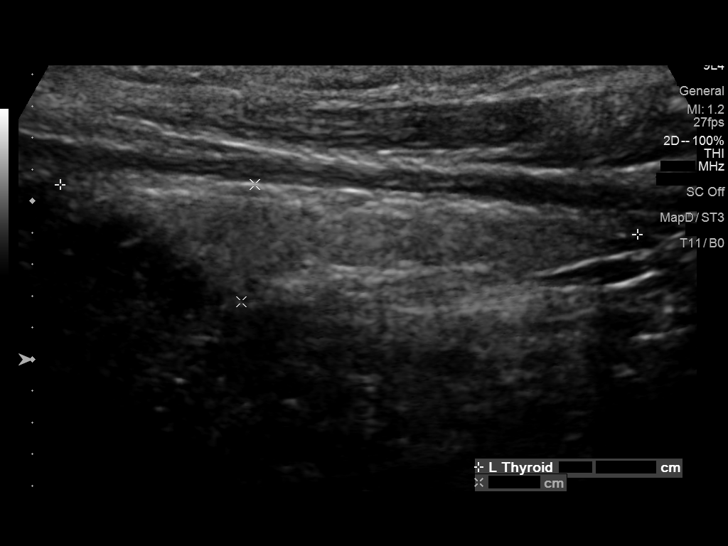
[im 10/27]
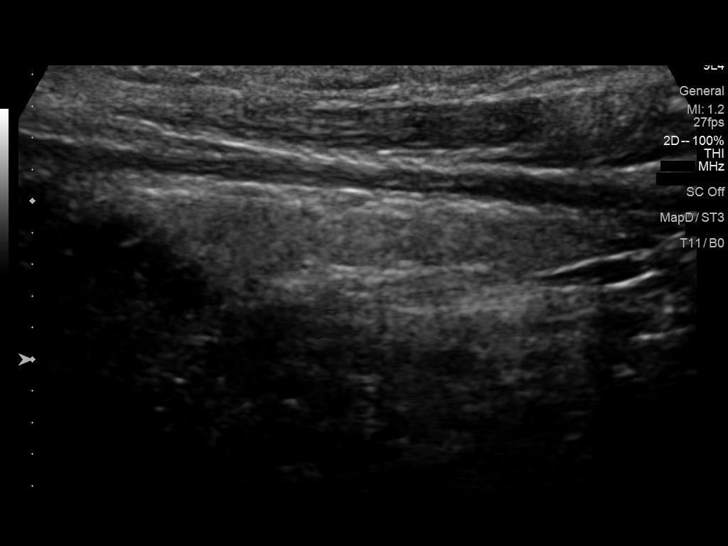
[im 12/27]
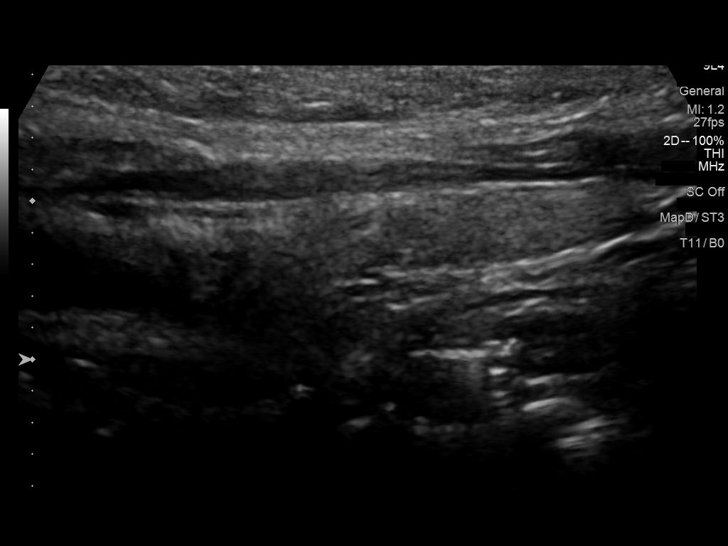
[im 15/27]
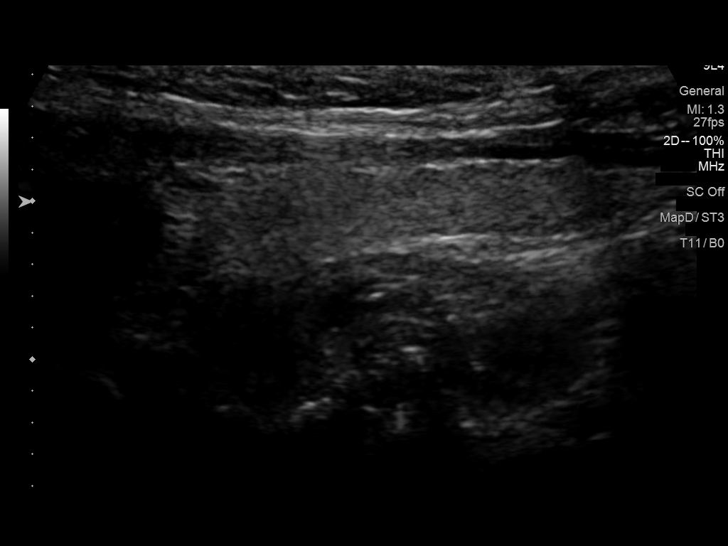
[im 17/27]
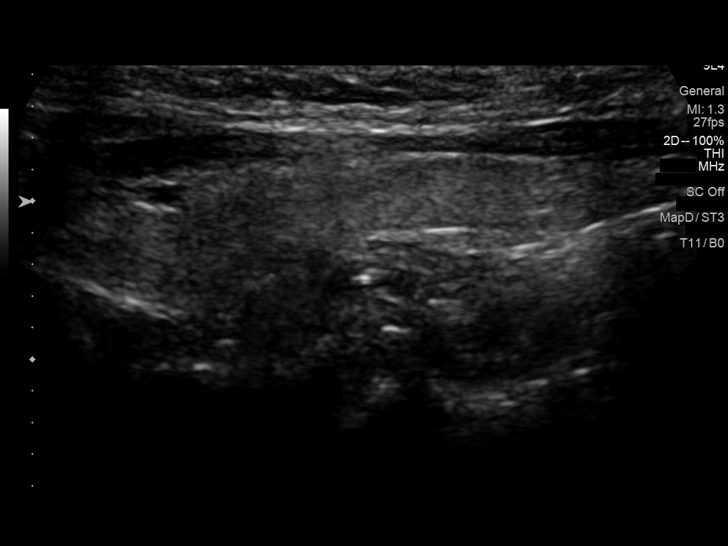
[im 18/27]
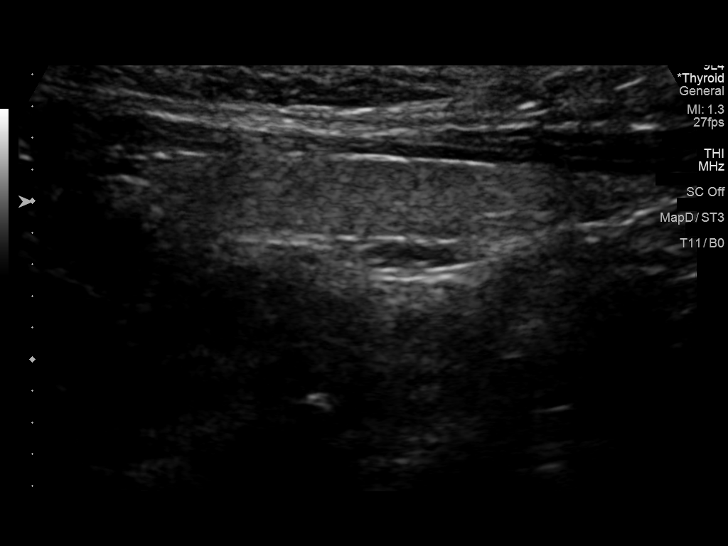
[im 20/27]
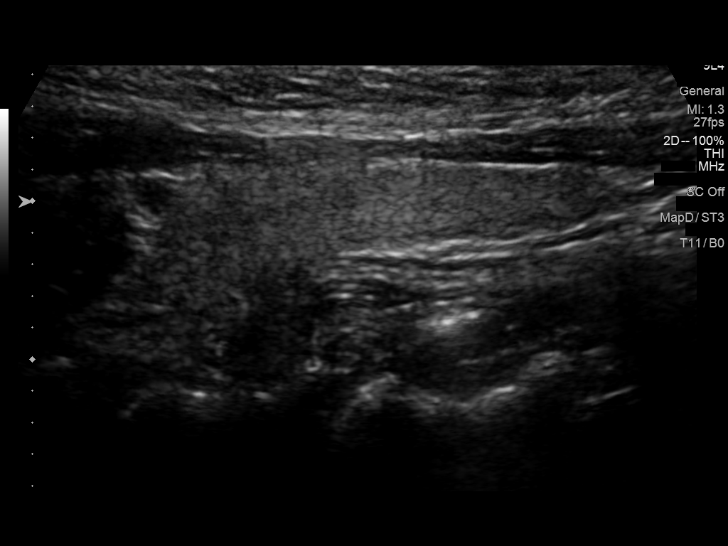
[im 22/27]
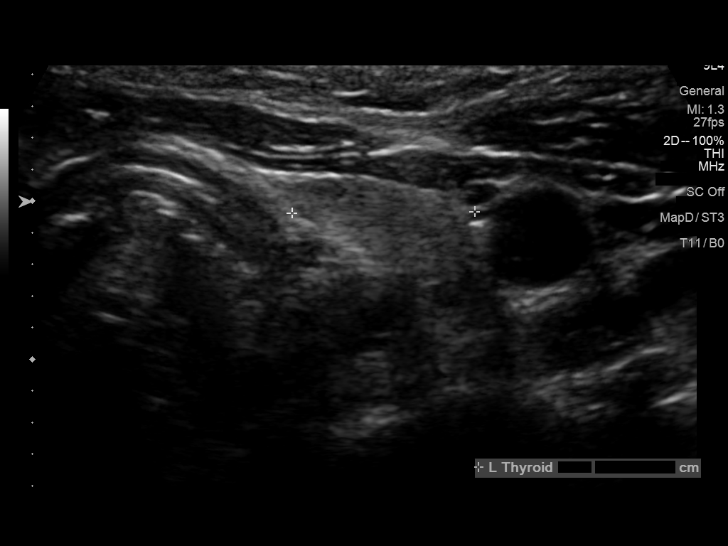
[im 24/27]
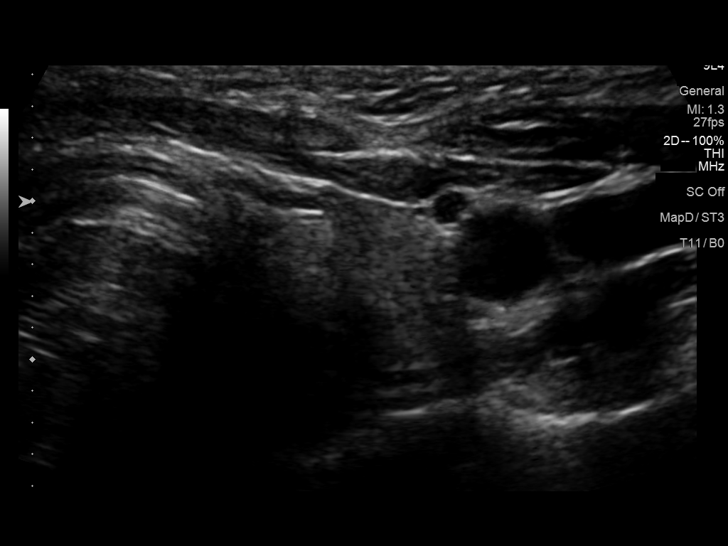
[im 27/27]
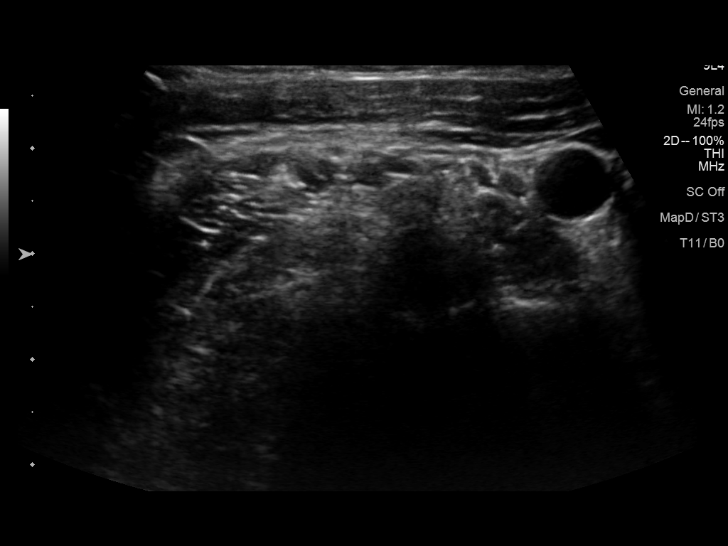

[14 of 25 positions shown; findings below may reference images not displayed]

FINDINGS: Parenchymal Echotexture: Mildly heterogenous

Isthmus: Surgical resection

Right lobe: Surgical resection

Left lobe: 3.7 cm x 0.8 cm x 1.2 cm

_________________________________________________________

Estimated total number of nodules >/= 1 cm: 0

Number of spongiform nodules >/=  2 cm not described below (TR1): 0

Number of mixed cystic and solid nodules >/= 1.5 cm not described
below (TR2): 0

_________________________________________________________

Nodule labeled 1 in the lower left thyroid (previously 2) measures 6
mm, decreased from the prior of 9 mm. Nodule does not meet criteria
for surveillance or biopsy.

No adenopathy

Recommendations follow those established by the new ACR TI-RADS
criteria ([HOSPITAL] 6669;[DATE]).
IMPRESSION: Interval surgical changes right hemithyroidectomy.

## 2022-06-04 ENCOUNTER — Ambulatory Visit: Admit: 2022-06-04 | Discharge: 2022-06-12 | Payer: BC Managed Care – PPO | Attending: Family | Primary: Family

## 2022-06-04 DIAGNOSIS — G44009 Cluster headache syndrome, unspecified, not intractable: Secondary | ICD-10-CM

## 2022-06-04 MED ORDER — liothyronine (CYTOMEL) 5 mcg tablet
5 | ORAL_TABLET | Freq: Every day | ORAL | 1 refills | 90.00000 days | Status: DC
Start: 2022-06-04 — End: 2022-09-17

## 2022-06-04 MED ORDER — hydroCHLOROthiazide (HYDRODIURIL) 25 mg tablet
25 | ORAL_TABLET | Freq: Every day | ORAL | 1 refills | Status: DC
Start: 2022-06-04 — End: 2022-09-17

## 2022-06-04 MED ORDER — pyridoxine, vitamin B6, (VITAMIN B-6) 25 mg tablet
25 | ORAL_TABLET | Freq: Every day | ORAL | 99 refills | Status: AC
Start: 2022-06-04 — End: ?

## 2022-06-04 MED ORDER — atenoloL (TENORMIN) 25 mg tablet
25 | ORAL_TABLET | Freq: Every day | ORAL | 1 refills | Status: AC
Start: 2022-06-04 — End: ?

## 2022-06-04 MED ORDER — rosuvastatin (CRESTOR) 5 mg tablet
5 | ORAL_TABLET | Freq: Every day | ORAL | 1 refills | Status: DC
Start: 2022-06-04 — End: 2022-09-17

## 2022-06-04 MED ORDER — levothyroxine (SYNTHROID) 150 mcg tablet
150 | ORAL_TABLET | Freq: Every day | ORAL | 1 refills | Status: DC
Start: 2022-06-04 — End: 2022-09-17

## 2022-06-04 MED ORDER — progesterone (PROMETRIUM) 100 mg capsule
100 | ORAL_CAPSULE | Freq: Every day | ORAL | 1 refills | 90.00000 days | Status: DC
Start: 2022-06-04 — End: 2022-09-17

## 2022-06-04 NOTE — Patient Instructions (Addendum)
Continue to take the one elevated B complex.     Add on riboflavin 5 phosphate 400 mg daily for migraines.     Add on 25 mg of vitmain B6 daily.     Continue on all other medications/supplements.     Check bloodwork ordered today. These are fasting labs, so please do not eat or drink anything but water for 12 hours prior to blood draw. Also, if you are taking vitamins or thyroid medication please do not take them 24 hours prior to getting blood drawn.     It is possible that some of the labs ordered today may or may not be covered by your insurance. I strongly recommend that you call your insurance for verification of covered benefits to determine cost of lab work.

## 2022-06-04 NOTE — Progress Notes (Signed)
Kerry Allen is a 46 y.o. female seen today for Abnormal CRP lab and Abnormal vitamin B6 lab  .    Patient is alone. The Patient is the medical decision(s) maker attending today's appointment.    ASSESSMENT AND PLAN:  Problem List Items Addressed This Visit    None  Visit Diagnoses     Migraine-cluster headache syndrome    -  Primary    Other Relevant Orders    MRI brain with and without contrast    Labile hypertension        Relevant Medications    hydroCHLOROthiazide (HYDRODIURIL) 25 mg tablet    atenoloL (TENORMIN) 25 mg tablet    Arthralgia, unspecified joint        Relevant Orders    ANA Reflex Cascade 1 -Routine    Acquired hypothyroidism        Relevant Medications    liothyronine (CYTOMEL) 5 mcg tablet    levothyroxine (SYNTHROID) 150 mcg tablet    Other Relevant Orders    Thyroglobulin and Thyroperoxidase Antibodies -Routine    Weight gain        Salt craving        Other Relevant Orders    Cortisol -Routine          Plan:     Patient Instructions   Continue to take the one elevated B complex.     Add on riboflavin 5 phosphate 400 mg daily for migraines.     Add on 25 mg of vitmain B6 daily.     Continue on all other medications/supplements.     Check bloodwork ordered today. These are fasting labs, so please do not eat or drink anything but water for 12 hours prior to blood draw. Also, if you are taking vitamins or thyroid medication please do not take them 24 hours prior to getting blood drawn.     It is possible that some of the labs ordered today may or may not be covered by your insurance. I strongly recommend that you call your insurance for verification of covered benefits to determine cost of lab work.           Support for the above plan obtained during this visit:    HPI  Lab Results  Pt here to discuss the following lab results as listed below. Pt educated regarding the findings and treatment plan developed.     Joint pain, headaches-waking up with them, no nausea. Light headed, brain  fog,eye fatigue. 15 lb weight gain in the past 6 months and has had no dietary changes. Increased emotional lability, feels like she gets irritable very easily. Losing her common items-cell phone, keys, etc.     ALLERGIES:  Allergies   Allergen Reactions   . Penicillins Rash     When she was a child       Outpatient Medications Marked as Taking for the 06/04/22 encounter (Office Visit) with [name], FNP   Medication Sig Dispense Refill   . acetylcysteine (NAC) 600 mg Cap Take 600 mg by mouth 2 times daily. 180 capsule PRN   . b complex vitamins capsule One Elevated Methylfolate+ (B-Complex) 1 cap every morning     . cholecalciferol, vitamin D3, 125 mcg (5,000 unit) capsule Take 5,000 IU daily and Monday, Wednesday and Friday take 16109 IU. 100 capsule prn   . INOSITOL, BULK, MISC by Miscellaneous route.     Marland Kitchen levomefolate calcium (L-METHYLFOLATE) 10 mg Cap capsule Take 10 mg daily  90 capsule    . omega-3 fatty acids 1,000 mg Cap Take 1 capsule nightly with largest meal 180 each 1   . [DISCONTINUED] atenoloL (TENORMIN) 25 mg tablet Take 1/2 to 1 tablet by mouth once daily for palpitations. 90 tablet 1   . [DISCONTINUED] benzonatate (TESSALON PERLES) 100 MG capsule Take 1 capsule by mouth 3 times daily as needed for Cough. 60 capsule 1   . [DISCONTINUED] hydroCHLOROthiazide (HYDRODIURIL) 25 mg tablet Take 1 tablet by mouth daily for hypertension. 30 tablet 2   . [DISCONTINUED] levothyroxine (SYNTHROID) 150 mcg tablet Take 1 tablet by mouth daily. 90 tablet 1   . [DISCONTINUED] progesterone (PROMETRIUM) 100 mg capsule Take 1 capsule by mouth daily for hormone replacement therapy 30 capsule 2   . [DISCONTINUED] rosuvastatin (CRESTOR) 5 mg tablet Take 1 tablet by mouth daily. 365 tablet 0       VITALS:  Vitals:    06/04/22 1545   BP: 120/80   BP Location: Left arm   Patient Position: Sitting   Pulse: 67   Temp: 37.1 ?C (98.7 ?F)   TempSrc: Oral   SpO2: 99%   Weight: 184 lb 12.8 oz (83.8 kg)   Height: 5' 2  (1.575 m)     Body mass index is 33.8 kg/m?Marland Kitchen  No LMP recorded. Patient has had an ablation.    Ideal body weight: 110 lb 7.2 oz (50.1 kg)  Adjusted ideal body weight: 140 lb 3 oz (63.6 kg)     Wt Readings from Last 3 Encounters:   06/04/22 184 lb 12.8 oz (83.8 kg)   11/14/21 169 lb 12.8 oz (77 kg)   11/01/21 167 lb 6.4 oz (75.9 kg)       Review of Systems   HENT: Negative for congestion, sinus pain, sneezing and sore throat.    Respiratory: Negative.    Cardiovascular: Negative.    Gastrointestinal: Positive for constipation. Negative for abdominal distention, abdominal pain, anal bleeding, blood in stool, diarrhea, nausea, rectal pain and vomiting.   Neurological: Positive for dizziness and headaches.       Also see HPI for ROS    Physical Exam  Constitutional:       General: She is not in acute distress.     Appearance: She is well-developed.   Eyes:      General: Vision grossly intact.      Extraocular Movements:      Right eye: Nystagmus present.      Left eye: Nystagmus present.      Conjunctiva/sclera: Conjunctivae normal.      Pupils: Pupils are equal, round, and reactive to light.   Cardiovascular:      Rate and Rhythm: Normal rate and regular rhythm.   Pulmonary:      Effort: Pulmonary effort is normal.      Breath sounds: Normal breath sounds.   Musculoskeletal:         General: Normal range of motion.   Skin:     General: Skin is warm and dry.   Neurological:      Mental Status: She is alert and oriented to person, place, and time. Mental status is at baseline.      Cranial Nerves: No cranial nerve deficit.      Coordination: Coordination normal.      Gait: Gait normal.   Psychiatric:         Behavior: Behavior normal.         Thought Content: Thought content normal.  Judgment: Judgment normal.         Items reviewed in preparation for this visit:  The following portions of the patient's history were reviewed and updated as appropriate: allergies, current medications, past family history, past  medical history, past surgical history and problem list.  When necessary and appropriate, prior medical records, medical history from family members or other providers were reviewed for coordination of care.     LABWORK COMPLETED PRIOR TO THIS VISIT:  Lab Walk-In on 05/07/2022   Component Date Value Ref Range Status   . Sodium 05/07/2022 142  135 - 143 mmol/L Final   . Potassium 05/07/2022 4.1  3.5 - 5.1 mmol/L Final   . Chloride 05/07/2022 106  98 - 111 mmol/L Final   . CO2 - Carbon Dioxide 05/07/2022 27  21 - 31 mmol/L Final   . Glucose 05/07/2022 92  80 - 99 mg/dL Final   . BUN 16/08/9603 15  6 - 23 mg/dL Final   . Creatinine 54/07/8118 0.81  0.55 - 1.10 mg/dL Final   . Calcium 14/78/2956 8.9  8.6 - 10.3 mg/dL Final   . Anion Gap 21/30/8657 9  4 - 13 mmol/L Final   . Glomerular Filtration Rate Estimat* 05/07/2022 >60  >=60 mL/min/1.82m*2 Final   . GFR Additional Info 05/07/2022    Final   . Insulin 05/07/2022 4.7  2.0 - 23.0 ?IU/mL Final   . Progesterone 05/07/2022 0.6  <50.8 ng/mL Final   . Estradiol 05/07/2022 57.6  See Mnemonic Comment pg/mL Final   . TSH - Thyroid Stimulating Hormone 05/07/2022 1.60  0.45 - 5.33 ?IU/mL Final   . T4, Free 05/07/2022 0.8  0.6 - 1.2 ng/dL Final   . Vitamin B2 84/69/6295 3  1 - 19 mcg/L Final   . Vitamin B6 (Pyridoxal 5-Phosphate) 05/07/2022 4 (L)  5 - 50 mcg/L Final   . Vitamin B12 05/07/2022 856  180 - 914 pg/mL Final   . Folate 05/07/2022 17.7  5.9 - 24.8 ng/mL Final   . Erythrocyte Sedimentation Rate, Au* 05/07/2022 16  0 - 20 mm/hr Final   . CRP 05/07/2022 6.40 (H)  <5.00 mg/L Final       RADIOLOGY STUDIES COMPLETED PRIOR TO THIS VISIT:  No results found.    Health Maintenance Due  Health Maintenance Due   Topic Date Due   . Hepatitis C Screening  Never done   . Cervical  05/29/2018   . COVID-19 Vaccine (3 - Pfizer series) 05/18/2020       Past Medical History:   Diagnosis Date   . Abnormal ultrasound 05/13/2017   . Acute non-recurrent maxillary sinusitis 10/06/2020   . hx of  Graves' disease 2002    s/p I131  in 2002   . Postablative hypothyroidism     Hypothyroidism-Dr. Leonia Reader 2002   . Vitamin D deficiency      Past Surgical History:   Procedure Laterality Date   . ABDOMINAL SURGERY     . APPENDECTOMY     . AUGMENTATION MAMMAPLASTY Bilateral 2020   . CERVICAL BIOPSY     . PANNICULECTOMY  2005    tummy tuck   . PROCEDURE Right 06/27/2017    Procedure: LAPAROSCOPY;  ADHESIOLYSIS; ARGON BEAM TREATMENT OF EXTENSIVE ENDOMETRIOSIS;  Surgeon: Rolene Arbour, MD;  Location: [facility] OR;  Service: Gynecology;  Laterality: Right;   . PROCEDURE N/A 11/08/2017    Procedure: COLONOSCOPY with sedation;  Surgeon: Olin Pia, MD;  Location: [facility];  Service:  Endoscopy;  Laterality: N/A;   . PROCEDURE Right 09/18/2018    Procedure: LAPAROSCOPY; RIGHTSALPINGO-OOPHORECTOMY; APPENDECTOMY;  LYSIS OF PERIADENEXAL ADHESIONS;  Surgeon: Rolene Arbour, MD;  Location: [facility] OR;  Service: Gynecology;  Laterality: Right;   . TUBAL LIGATION      Tubal ligation 2008     Family History   Problem Relation Age of Onset   . Diabetes Mother    . Heart attack Mother         during a heart cath   . Lung cancer Mother         stage III   . Tobacco Dependence Mother    . Heart attack Father    . Tobacco Dependence Father    . No Known Health Problems Son    . Diabetes type II Maternal Grandmother    . Hypertension Maternal Grandmother    . Diabetes type II Paternal Grandmother    . Hypertension Paternal Grandmother    . Coronary artery disease Paternal Grandfather    . Heart attack Paternal Grandfather    . No Known Health Problems Son    . Breast cancer Neg Hx    . Ovarian cancer Neg Hx       Social History     Socioeconomic History   . Marital status: Married     Spouse name: Jerrod   . Number of children: 2   Tobacco Use   . Smoking status: Never   . Smokeless tobacco: Never   . Tobacco comments:     passive smoke exposure   Substance and Sexual Activity   . Alcohol use: Yes     Alcohol/week: 0.0 standard drinks of  alcohol     Comment: occasional    . Drug use: No   . Sexual activity: Yes     Partners: Male     Birth control/protection: Surgical     Comment: Tubal Ligation   Other Topics Concern   . Caffeine Concern Yes     Comment: moderate   . Exercise Yes     Comment: occasional        I had a thoughtful and careful discussion regarding the issues today. All questions were answered. I educated and reassured. I encouraged followup as needed. Hand written instructions were given via the After Visit Summary including reference literature as appropriate. Today's medical decision maker  verbalize understanding of the treatment plan and are in agreement with the treatment plan goals. Medical decision maker is aware of how to contact clinical provider if concerns arise.    FOLLOW-UP:  Return for F/U on radiology testing ordered, F/U to review lab results ordered at today's visit.       This note was transcribed using speech recognition software. Please contact us for clarification if any questions arise relating to the wording of this document.

## 2022-06-13 ENCOUNTER — Other Ambulatory Visit: Admit: 2022-06-13 | Discharge: 2022-06-13 | Payer: BC Managed Care – PPO | Primary: Family

## 2022-06-13 DIAGNOSIS — M2550 Pain in unspecified joint: Secondary | ICD-10-CM

## 2022-06-13 LAB — ANA REFLEX CASCADE 1
ANA Screen (Symphony) Interpretation: NEGATIVE
ANA Screen (Symphony): 0.2 Units (ref <0.70)
dsDNA Antibody, IgG Interpretation: NEGATIVE
dsDNA Antibody, IgG: 3 IU/mL (ref <10.0)

## 2022-06-13 LAB — THYROGLOBULIN ANTIBODY (LINKED PANEL): Thyroglobulin Antibody: <0.9 IU/mL (ref <4.0)

## 2022-06-13 LAB — CORTISOL: Cortisol: 11.3 ug/dL

## 2022-06-13 LAB — THYROPEROXIDASE ANTIBODY (LINKED PANEL): THYROPEROXIDASE ANTIBODY: 1.4 IU/mL (ref <9.00)

## 2022-06-14 DIAGNOSIS — G44009 Cluster headache syndrome, unspecified, not intractable: Secondary | ICD-10-CM

## 2022-06-15 ENCOUNTER — Inpatient Hospital Stay: Admit: 2022-06-15 | Payer: BC Managed Care – PPO | Attending: Family | Primary: Family

## 2022-06-15 MED ORDER — gadobutroL (GADAVIST) injection 8 mL
1 | Freq: Once | INTRAVENOUS | Status: AC
Start: 2022-06-15 — End: 2022-06-14
  Administered 2022-06-15: 03:00:00 1 mL via INTRAVENOUS

## 2022-06-19 NOTE — Telephone Encounter (Addendum)
-----   Message from [name], FNP sent at 06/19/2022  2:32 PM PDT -----  All labs are normal. I am going to put in a referral to see Dr. Rikki Spearing for further evaluation of her arthralgia.         Recent Results (from the past 168 hour(s))   ANA Reflex Cascade 1 -Routine    Collection Time: 06/13/22  7:11 AM   Result Value Ref Range    dsDNA Antibody, IgG 3.0 <10.0 IU/mL    dsDNA Antibody, IgG Interpretation Negative Negative    ANA Screen ([facility]) 0.20 <0.70 Units    ANA Screen ([facility]) Interpretation Negative Negative   Cortisol -Routine    Collection Time: 06/13/22  7:11 AM   Result Value Ref Range    Cortisol 11.3 ?g/dL   Thyroglobulin Antibody -Routine    Collection Time: 06/13/22  7:11 AM   Result Value Ref Range    Thyroglobulin Antibody <0.9 <4.0 IU/mL   Thyroperoxidase Antibody -Routine    Collection Time: 06/13/22  7:11 AM   Result Value Ref Range    THYROPEROXIDASE ANTIBODY 1.40 <9.00 IU/mL

## 2022-06-19 NOTE — Telephone Encounter (Signed)
Referral to Dr Rikki Spearing placed. [name] already sent MyChart message to patient regarding results/recommendations.

## 2022-06-23 NOTE — Telephone Encounter (Signed)
Ok for referral to ENT, but if it requires a chart note, will need to be seen.

## 2022-06-23 NOTE — Telephone Encounter (Signed)
From: [name]  To: [name], FNP  Sent: 06/22/2022 4:00 PM PDT  Subject: Ent referral    I was seeing if I can get a referral to ent for my hearing. It is getting worse and just seeing how bad it is.     Thank u.

## 2022-07-10 ENCOUNTER — Encounter: Payer: BC Managed Care – PPO | Attending: Family | Primary: Family

## 2022-08-01 ENCOUNTER — Ambulatory Visit: Admit: 2022-08-01 | Payer: BC Managed Care – PPO | Attending: Family | Primary: Family

## 2022-08-01 DIAGNOSIS — H9193 Unspecified hearing loss, bilateral: Secondary | ICD-10-CM

## 2022-08-01 NOTE — Patient Instructions (Addendum)
Start on One Elevated Methylfolate+ 1 capsule daily for low B complex vitamin levels.         start 1000 mcg methylcobalamin under tongue daily.            If you continue to have migraines-add 200-400 mg of vitamin B2 daily-studies are showing that this can be helpful.

## 2022-08-01 NOTE — Progress Notes (Signed)
Kerry Allen is a 46 y.o. female seen today for Requesting A Referral  .    Patient is alone. The Patient is the medical decision(s) maker attending today's appointment.    ASSESSMENT AND PLAN:  Problem List Items Addressed This Visit    None  Visit Diagnoses     Bilateral hearing loss, unspecified hearing loss type    -  Primary    Relevant Orders    Other Non-[facility] Referral to ENT    Low ferritin        Relevant Orders    Ferritin -Routine    Iron & Total Iron Binding Capacity -Routine          Plan:     Patient Instructions   Referral to ENT for further evaluation.    Start on One Elevated Methylfolate+ 1 capsule daily for low B complex vitamin levels.     start 1000 mcg methylcobalamin under tongue daily.     If you continue to have migraines-add 200-400 mg of vitamin B2 daily-studies are showing that this can be helpful.         Support for the above plan obtained during this visit:    HPI  Hearing loss  Patient present in the office states she is not hearing well states she took hearing test in May,2023 at the [place] for disability states she failed test. Pt worked in [employer] around [employer] and did not always have ear protection on.     Lab Results  Pt here to discuss the following lab results as listed below. Pt educated regarding the findings and treatment plan developed.       ALLERGIES:  Allergies   Allergen Reactions   . Penicillins Rash     When she was a child       Outpatient Medications Marked as Taking for the 08/01/22 encounter (Office Visit) with [name], FNP   Medication Sig Dispense Refill   . acetylcysteine (NAC) 600 mg Cap Take 600 mg by mouth 2 times daily. 180 capsule PRN   . atenoloL (TENORMIN) 25 mg tablet Take 1/2 to 1 tablet by mouth once daily for palpitations. 90 tablet 1   . b complex vitamins capsule One Elevated Methylfolate+ (B-Complex) 1 cap every morning     . cholecalciferol, vitamin D3, 125 mcg (5,000 unit) capsule Take 5,000 IU daily and Monday,  Wednesday and Friday take 16109 IU. 100 capsule prn   . hydroCHLOROthiazide (HYDRODIURIL) 25 mg tablet Take 1 tablet by mouth daily for hypertension. 90 tablet 1   . INOSITOL, BULK, MISC by Miscellaneous route.     Marland Kitchen levomefolate calcium (L-METHYLFOLATE) 10 mg Cap capsule Take 10 mg daily 90 capsule    . levothyroxine (SYNTHROID) 150 mcg tablet Take 1 tablet by mouth daily. 90 tablet 1   . liothyronine (CYTOMEL) 5 mcg tablet Take 1 tablet by mouth once daily. 90 tablet 1   . omega-3 fatty acids 1,000 mg Cap Take 1 capsule nightly with largest meal 180 each 1   . progesterone (PROMETRIUM) 100 mg capsule Take 1 capsule by mouth daily for hormone replacement therapy 90 capsule 1   . pyridoxine, vitamin B6, (VITAMIN B-6) 25 mg tablet Take 1 tablet by mouth once daily. 90 tablet PRN   . rosuvastatin (CRESTOR) 5 mg tablet Take 1 tablet by mouth daily. 90 tablet 1       VITALS:  Vitals:    08/01/22 1332   BP: 122/80  BP Location: Left arm   Patient Position: Sitting   Pulse: 80   Temp: 36.9 ?C (98.5 ?F)   TempSrc: Oral   SpO2: 98%   Weight: 178 lb 12.8 oz (81.1 kg)   Height: 5' 2 (1.575 m)     Body mass index is 32.7 kg/m?Marland Kitchen  No LMP recorded. Patient has had an ablation.    Ideal body weight: 110 lb 7.2 oz (50.1 kg)  Adjusted ideal body weight: 137 lb 12.6 oz (62.5 kg)     Wt Readings from Last 3 Encounters:   08/01/22 178 lb 12.8 oz (81.1 kg)   06/04/22 184 lb 12.8 oz (83.8 kg)   11/14/21 169 lb 12.8 oz (77 kg)       Review of Systems   HENT: Negative for congestion, ear pain, postnasal drip, sinus pressure, sinus pain and sore throat.    Respiratory: Positive for cough. Negative for chest tightness, shortness of breath and wheezing.    Gastrointestinal: Negative.    Neurological: Positive for headaches. Negative for dizziness.     Also see HPI for ROS    Physical Exam  Constitutional:       General: She is not in acute distress.     Appearance: She is well-developed.   Cardiovascular:      Rate and Rhythm: Normal rate  and regular rhythm.   Pulmonary:      Effort: Pulmonary effort is normal.      Breath sounds: Normal breath sounds.   Musculoskeletal:         General: Normal range of motion.   Skin:     General: Skin is warm and dry.      Capillary Refill: Capillary refill takes less than 2 seconds.      Findings: Bruising (Couple of small bruises noted, thinning hair) present.   Neurological:      Mental Status: She is alert and oriented to person, place, and time. Mental status is at baseline.       Items reviewed in preparation for this visit:  The following portions of the patient's history were reviewed and updated as appropriate: allergies, current medications, past family history, past medical history, past surgical history and problem list.  When necessary and appropriate, prior medical records, medical history from family members or other providers were reviewed for coordination of care.     LABWORK COMPLETED PRIOR TO THIS VISIT:  Lab Walk-In on 06/13/2022   Component Date Value Ref Range Status   . dsDNA Antibody, IgG 06/13/2022 3.0  <10.0 IU/mL Final   . dsDNA Antibody, IgG Interpretation 06/13/2022 Negative  Negative Final   . ANA Screen ([facility]) 06/13/2022 0.20  <0.70 Units Final   . ANA Screen ([facility]) Interpretati* 06/13/2022 Negative  Negative Final   . Cortisol 06/13/2022 11.3  ?g/dL Final   . Thyroglobulin Antibody 06/13/2022 <0.9  <4.0 IU/mL Final   . THYROPEROXIDASE ANTIBODY 06/13/2022 1.40  <9.00 IU/mL Final   Lab Walk-In on 05/07/2022   Component Date Value Ref Range Status   . Sodium 05/07/2022 142  135 - 143 mmol/L Final   . Potassium 05/07/2022 4.1  3.5 - 5.1 mmol/L Final   . Chloride 05/07/2022 106  98 - 111 mmol/L Final   . CO2 - Carbon Dioxide 05/07/2022 27  21 - 31 mmol/L Final   . Glucose 05/07/2022 92  80 - 99 mg/dL Final   . BUN 16/08/9603 15  6 - 23 mg/dL Final   . Creatinine 54/07/8118  0.81  0.55 - 1.10 mg/dL Final   . Calcium 16/08/9603 8.9  8.6 - 10.3 mg/dL Final   . Anion Gap 54/07/8118 9   4 - 13 mmol/L Final   . Glomerular Filtration Rate Estimat* 05/07/2022 >60  >=60 mL/min/1.31m*2 Final   . GFR Additional Info 05/07/2022    Final   . Insulin 05/07/2022 4.7  2.0 - 23.0 ?IU/mL Final   . Progesterone 05/07/2022 0.6  <50.8 ng/mL Final   . Estradiol 05/07/2022 57.6  See Mnemonic Comment pg/mL Final   . TSH - Thyroid Stimulating Hormone 05/07/2022 1.60  0.45 - 5.33 ?IU/mL Final   . T4, Free 05/07/2022 0.8  0.6 - 1.2 ng/dL Final   . Vitamin B2 14/78/2956 3  1 - 19 mcg/L Final   . Vitamin B6 (Pyridoxal 5-Phosphate) 05/07/2022 4 (L)  5 - 50 mcg/L Final   . Vitamin B12 05/07/2022 856  180 - 914 pg/mL Final   . Folate 05/07/2022 17.7  5.9 - 24.8 ng/mL Final   . Erythrocyte Sedimentation Rate, Au* 05/07/2022 16  0 - 20 mm/hr Final   . CRP 05/07/2022 6.40 (H)  <5.00 mg/L Final       RADIOLOGY STUDIES COMPLETED PRIOR TO THIS VISIT:  MRI brain with and without contrast    Result Date: 06/14/2022  EXAM: MRI BRAIN WITHOUT AND WITH CONTRAST EXAM DATE: 06/14/2022 07:52 PM CLINICAL HISTORY: Waking up with morning headaches-more frequently. 15 lb weight gain in past 6 mo. Craving salt, joint pain. COMPARISON: None TECHNIQUE: Multiplanar, multisequence T1-weighted and fluid-sensitive MR sequences of the brain were performed before and after administration of intravenous contrast. Sequences optimized for routine evaluation. Other: None. IV Contrast: GADAVIST. Findings: Relevant images are indicated (image number, series number). There is no acute/subacute ischemic change. There is no hemorrhage, mass or midline shift. There is no cortical atrophy or ventricular enlargement. There are no suspicious marrow lesions. There is no significant hemosiderin deposition present in the brain. Basal cisterns, bilateral IACs, bilateral Meckel's caves are clear. Paranasal sinuses, mastoid air cells demonstrate no evidence of significant fluid or mass. Extraocular muscles, optic nerves, orbital apex, optic chiasm negative. There are  normal expected vascular flow voids of the major arteries, major draining veins. Minimal/questionable white matter disease within the pons, supratentorial brain demonstrates no white matter disease. Pituitary, infundibulum are unremarkable. The midbrain demonstrates no atrophy. Craniocervical junction, limited upper cervical cord are otherwise unremarkable. Postcontrast imaging demonstrates no abnormal enhancement of the brain, meninges. Patent major draining veins.     IMPRESSION: 1. No acute or subacute ischemic change. 2. Questionable trivial white matter disease in the pons otherwise unremarkable brain and orbits. 3. Postcontrast imaging demonstrates no abnormal enhancement of the brain, meninges. Patent major draining veins. RADIA Dictated By: Sallye Lat MD 2022-06-14 [phone] Signed By: Sallye Lat MD 2022-06-14 20:06:07.0 Transcribed By: Sallye Lat 2022-06-14 20:06:07.09 SITE ID: [id number] Patients are advised to discuss imaging findings and recommendations with the ordering provider.      Health Maintenance Due  Health Maintenance Due   Topic Date Due   . Hepatitis C Screening  Never done   . Cervical  05/29/2018   . Influenza Vaccine (1) 07/06/2022       Past Medical History:   Diagnosis Date   . Abnormal ultrasound 05/13/2017   . Acute non-recurrent maxillary sinusitis 10/06/2020   . hx of Graves' disease 2002    s/p I131  in 2002   . Postablative hypothyroidism  Hypothyroidism-Dr. Leonia Reader 2002   . Vitamin D deficiency      Past Surgical History:   Procedure Laterality Date   . ABDOMINAL SURGERY     . APPENDECTOMY     . AUGMENTATION MAMMAPLASTY Bilateral 2020   . CERVICAL BIOPSY     . PANNICULECTOMY  2005    tummy tuck   . PROCEDURE Right 06/27/2017    Procedure: LAPAROSCOPY;  ADHESIOLYSIS; ARGON BEAM TREATMENT OF EXTENSIVE ENDOMETRIOSIS;  Surgeon: Rolene Arbour, MD;  Location: [facility] OR;  Service: Gynecology;  Laterality: Right;   . PROCEDURE N/A 11/08/2017    Procedure: COLONOSCOPY with sedation;  Surgeon:  Olin Pia, MD;  Location: [facility] ENDO;  Service: Endoscopy;  Laterality: N/A;   . PROCEDURE Right 09/18/2018    Procedure: LAPAROSCOPY; RIGHTSALPINGO-OOPHORECTOMY; APPENDECTOMY;  LYSIS OF PERIADENEXAL ADHESIONS;  Surgeon: Rolene Arbour, MD;  Location: [facility] OR;  Service: Gynecology;  Laterality: Right;   . TUBAL LIGATION      Tubal ligation 2008     Family History   Problem Relation Age of Onset   . Diabetes Mother    . Heart attack Mother         during a heart cath   . Lung cancer Mother         stage III   . Tobacco Dependence Mother    . Heart attack Father    . Tobacco Dependence Father    . No Known Health Problems Son    . Diabetes type II Maternal Grandmother    . Hypertension Maternal Grandmother    . Diabetes type II Paternal Grandmother    . Hypertension Paternal Grandmother    . Coronary artery disease Paternal Grandfather    . Heart attack Paternal Grandfather    . No Known Health Problems Son    . Breast cancer Neg Hx    . Ovarian cancer Neg Hx       Social History     Socioeconomic History   . Marital status: Married     Spouse name: Jerrod   . Number of children: 2   Tobacco Use   . Smoking status: Never   . Smokeless tobacco: Never   . Tobacco comments:     passive smoke exposure   Substance and Sexual Activity   . Alcohol use: Yes     Alcohol/week: 0.0 standard drinks of alcohol     Comment: occasional    . Drug use: No   . Sexual activity: Yes     Partners: Male     Birth control/protection: Surgical     Comment: Tubal Ligation   Other Topics Concern   . Caffeine Concern Yes     Comment: moderate   . Exercise Yes     Comment: occasional        I had a thoughtful and careful discussion regarding the issues today. All questions were answered. I educated and reassured. I encouraged followup as needed. Hand written instructions were given via the After Visit Summary including reference literature as appropriate. Today's medical decision maker  verbalize understanding of the treatment plan and are  in agreement with the treatment plan goals. Medical decision maker is aware of how to contact clinical provider if concerns arise.      FOLLOW-UP:  Return if symptoms worsen or fail to improve.         This note was transcribed using speech recognition software. Please contact us for clarification if any questions arise relating to the  wording of this document.

## 2022-09-18 MED ORDER — levothyroxine (SYNTHROID) 150 mcg tablet
150 | ORAL_TABLET | Freq: Every day | ORAL | 1 refills | Status: AC
Start: 2022-09-18 — End: ?

## 2022-09-18 MED ORDER — hydroCHLOROthiazide (HYDRODIURIL) 25 mg tablet
25 | ORAL_TABLET | Freq: Every day | ORAL | 1 refills | Status: AC
Start: 2022-09-18 — End: ?

## 2022-09-18 MED ORDER — progesterone (PROMETRIUM) 100 mg capsule
100 | ORAL_CAPSULE | Freq: Every day | ORAL | 1 refills | 90.00000 days | Status: AC
Start: 2022-09-18 — End: ?

## 2022-09-18 MED ORDER — rosuvastatin (CRESTOR) 5 mg tablet
5 | ORAL_TABLET | Freq: Every day | ORAL | 1 refills | Status: AC
Start: 2022-09-18 — End: ?

## 2022-09-18 MED ORDER — liothyronine (CYTOMEL) 5 mcg tablet
5 | ORAL_TABLET | Freq: Every day | ORAL | 1 refills | 90.00000 days | Status: AC
Start: 2022-09-18 — End: ?

## 2022-09-21 ENCOUNTER — Other Ambulatory Visit: Payer: Self-pay | Admitting: Internal Medicine

## 2023-03-07 ENCOUNTER — Ambulatory Visit: Payer: 59 | Admitting: Internal Medicine

## 2023-09-16 ENCOUNTER — Other Ambulatory Visit: Payer: Self-pay | Admitting: Internal Medicine

## 2023-11-26 DIAGNOSIS — Z1231 Encounter for screening mammogram for malignant neoplasm of breast: Secondary | ICD-10-CM

## 2023-11-27 ENCOUNTER — Inpatient Hospital Stay: Admit: 2023-11-27 | Payer: TRICARE (CHAMPUS) | Attending: MD | Primary: Family
